# Patient Record
Sex: Female | Born: 1940 | Race: White | Hispanic: No | Marital: Married | Smoking: Never smoker
Health system: Southern US, Community
[De-identification: ages and names within clinical notes are randomized; demographics above are authoritative.]

## PROBLEM LIST (undated history)

## (undated) DIAGNOSIS — R911 Solitary pulmonary nodule: Secondary | ICD-10-CM

## (undated) DIAGNOSIS — K649 Unspecified hemorrhoids: Secondary | ICD-10-CM

## (undated) DIAGNOSIS — L719 Rosacea, unspecified: Secondary | ICD-10-CM

## (undated) DIAGNOSIS — C801 Malignant (primary) neoplasm, unspecified: Secondary | ICD-10-CM

## (undated) DIAGNOSIS — J189 Pneumonia, unspecified organism: Secondary | ICD-10-CM

## (undated) DIAGNOSIS — T8859XA Other complications of anesthesia, initial encounter: Secondary | ICD-10-CM

## (undated) DIAGNOSIS — T4145XA Adverse effect of unspecified anesthetic, initial encounter: Secondary | ICD-10-CM

## (undated) DIAGNOSIS — M1990 Unspecified osteoarthritis, unspecified site: Secondary | ICD-10-CM

## (undated) DIAGNOSIS — K219 Gastro-esophageal reflux disease without esophagitis: Secondary | ICD-10-CM

## (undated) DIAGNOSIS — R112 Nausea with vomiting, unspecified: Secondary | ICD-10-CM

## (undated) DIAGNOSIS — B2790 Infectious mononucleosis, unspecified without complication: Secondary | ICD-10-CM

## (undated) DIAGNOSIS — B019 Varicella without complication: Secondary | ICD-10-CM

## (undated) DIAGNOSIS — K635 Polyp of colon: Secondary | ICD-10-CM

## (undated) DIAGNOSIS — I10 Essential (primary) hypertension: Secondary | ICD-10-CM

## (undated) DIAGNOSIS — Z9889 Other specified postprocedural states: Secondary | ICD-10-CM

## (undated) DIAGNOSIS — G459 Transient cerebral ischemic attack, unspecified: Secondary | ICD-10-CM

## (undated) HISTORY — PX: HEMORRHOID SURGERY: SHX153

## (undated) HISTORY — DX: Essential (primary) hypertension: I10

## (undated) HISTORY — DX: Varicella without complication: B01.9

## (undated) HISTORY — DX: Solitary pulmonary nodule: R91.1

## (undated) HISTORY — DX: Polyp of colon: K63.5

---

## 1990-05-15 HISTORY — PX: CHOLECYSTECTOMY: SHX55

## 1991-05-16 HISTORY — PX: ABDOMINAL HYSTERECTOMY: SHX81

## 1997-11-27 ENCOUNTER — Ambulatory Visit (HOSPITAL_COMMUNITY): Admission: RE | Admit: 1997-11-27 | Discharge: 1997-11-27 | Payer: Self-pay | Admitting: Family Medicine

## 1997-12-03 ENCOUNTER — Other Ambulatory Visit: Admission: RE | Admit: 1997-12-03 | Discharge: 1997-12-03 | Payer: Self-pay | Admitting: Family Medicine

## 1998-06-16 ENCOUNTER — Other Ambulatory Visit: Admission: RE | Admit: 1998-06-16 | Discharge: 1998-06-16 | Payer: Self-pay | Admitting: Gynecology

## 1998-07-07 ENCOUNTER — Ambulatory Visit (HOSPITAL_COMMUNITY): Admission: RE | Admit: 1998-07-07 | Discharge: 1998-07-07 | Payer: Self-pay | Admitting: Gastroenterology

## 1998-08-18 ENCOUNTER — Inpatient Hospital Stay (HOSPITAL_COMMUNITY): Admission: RE | Admit: 1998-08-18 | Discharge: 1998-08-20 | Payer: Self-pay | Admitting: Gynecology

## 1998-11-22 ENCOUNTER — Other Ambulatory Visit: Admission: RE | Admit: 1998-11-22 | Discharge: 1998-11-22 | Payer: Self-pay | Admitting: Family Medicine

## 1999-07-27 ENCOUNTER — Encounter: Admission: RE | Admit: 1999-07-27 | Discharge: 1999-07-27 | Payer: Self-pay | Admitting: Family Medicine

## 1999-07-27 ENCOUNTER — Encounter: Payer: Self-pay | Admitting: Family Medicine

## 2000-01-24 ENCOUNTER — Other Ambulatory Visit: Admission: RE | Admit: 2000-01-24 | Discharge: 2000-01-24 | Payer: Self-pay | Admitting: Family Medicine

## 2000-08-15 ENCOUNTER — Encounter: Admission: RE | Admit: 2000-08-15 | Discharge: 2000-08-15 | Payer: Self-pay | Admitting: Family Medicine

## 2000-08-15 ENCOUNTER — Encounter: Payer: Self-pay | Admitting: Family Medicine

## 2000-12-28 ENCOUNTER — Ambulatory Visit: Admission: RE | Admit: 2000-12-28 | Discharge: 2000-12-28 | Payer: Self-pay | Admitting: Orthopedic Surgery

## 2000-12-28 ENCOUNTER — Encounter: Payer: Self-pay | Admitting: Orthopedic Surgery

## 2001-01-10 ENCOUNTER — Ambulatory Visit (HOSPITAL_COMMUNITY): Admission: RE | Admit: 2001-01-10 | Discharge: 2001-01-10 | Payer: Self-pay | Admitting: Orthopedic Surgery

## 2001-04-16 ENCOUNTER — Encounter: Payer: Self-pay | Admitting: Orthopedic Surgery

## 2001-04-17 ENCOUNTER — Ambulatory Visit (HOSPITAL_COMMUNITY): Admission: RE | Admit: 2001-04-17 | Discharge: 2001-04-18 | Payer: Self-pay | Admitting: Orthopedic Surgery

## 2001-08-19 ENCOUNTER — Encounter: Admission: RE | Admit: 2001-08-19 | Discharge: 2001-08-19 | Payer: Self-pay | Admitting: Family Medicine

## 2001-08-19 ENCOUNTER — Encounter: Payer: Self-pay | Admitting: Family Medicine

## 2001-10-31 ENCOUNTER — Other Ambulatory Visit: Admission: RE | Admit: 2001-10-31 | Discharge: 2001-10-31 | Payer: Self-pay | Admitting: Family Medicine

## 2002-05-15 HISTORY — PX: BREAST BIOPSY: SHX20

## 2002-10-01 ENCOUNTER — Encounter: Admission: RE | Admit: 2002-10-01 | Discharge: 2002-10-01 | Payer: Self-pay | Admitting: Family Medicine

## 2002-10-01 ENCOUNTER — Encounter: Payer: Self-pay | Admitting: Family Medicine

## 2002-10-06 ENCOUNTER — Encounter: Admission: RE | Admit: 2002-10-06 | Discharge: 2002-10-06 | Payer: Self-pay | Admitting: Family Medicine

## 2002-10-06 ENCOUNTER — Encounter: Payer: Self-pay | Admitting: Family Medicine

## 2002-11-04 ENCOUNTER — Other Ambulatory Visit: Admission: RE | Admit: 2002-11-04 | Discharge: 2002-11-04 | Payer: Self-pay | Admitting: Family Medicine

## 2003-04-14 ENCOUNTER — Encounter: Admission: RE | Admit: 2003-04-14 | Discharge: 2003-04-14 | Payer: Self-pay | Admitting: Family Medicine

## 2003-12-14 ENCOUNTER — Observation Stay (HOSPITAL_COMMUNITY): Admission: EM | Admit: 2003-12-14 | Discharge: 2003-12-15 | Payer: Self-pay | Admitting: Emergency Medicine

## 2003-12-14 ENCOUNTER — Encounter: Admission: RE | Admit: 2003-12-14 | Discharge: 2003-12-14 | Payer: Self-pay | Admitting: Family Medicine

## 2003-12-14 ENCOUNTER — Other Ambulatory Visit: Admission: RE | Admit: 2003-12-14 | Discharge: 2003-12-14 | Payer: Self-pay | Admitting: Family Medicine

## 2004-05-25 ENCOUNTER — Encounter: Admission: RE | Admit: 2004-05-25 | Discharge: 2004-05-25 | Payer: Self-pay | Admitting: Family Medicine

## 2004-06-29 ENCOUNTER — Encounter: Admission: RE | Admit: 2004-06-29 | Discharge: 2004-06-29 | Payer: Self-pay | Admitting: Family Medicine

## 2004-08-08 ENCOUNTER — Ambulatory Visit: Payer: Self-pay | Admitting: Internal Medicine

## 2004-08-18 ENCOUNTER — Ambulatory Visit: Payer: Self-pay | Admitting: Internal Medicine

## 2005-01-17 ENCOUNTER — Other Ambulatory Visit: Admission: RE | Admit: 2005-01-17 | Discharge: 2005-01-17 | Payer: Self-pay | Admitting: Family Medicine

## 2005-02-06 ENCOUNTER — Ambulatory Visit: Payer: Self-pay | Admitting: Internal Medicine

## 2005-03-07 ENCOUNTER — Ambulatory Visit: Payer: Self-pay | Admitting: Internal Medicine

## 2005-05-15 DIAGNOSIS — G459 Transient cerebral ischemic attack, unspecified: Secondary | ICD-10-CM

## 2005-05-15 HISTORY — DX: Transient cerebral ischemic attack, unspecified: G45.9

## 2005-05-30 ENCOUNTER — Encounter: Admission: RE | Admit: 2005-05-30 | Discharge: 2005-05-30 | Payer: Self-pay | Admitting: Family Medicine

## 2005-09-13 ENCOUNTER — Encounter: Admission: RE | Admit: 2005-09-13 | Discharge: 2005-09-13 | Payer: Self-pay | Admitting: Family Medicine

## 2005-11-07 ENCOUNTER — Ambulatory Visit: Payer: Self-pay | Admitting: Internal Medicine

## 2006-06-05 ENCOUNTER — Encounter: Admission: RE | Admit: 2006-06-05 | Discharge: 2006-06-05 | Payer: Self-pay | Admitting: Family Medicine

## 2006-06-08 ENCOUNTER — Other Ambulatory Visit: Admission: RE | Admit: 2006-06-08 | Discharge: 2006-06-08 | Payer: Self-pay | Admitting: Family Medicine

## 2006-06-24 ENCOUNTER — Inpatient Hospital Stay (HOSPITAL_COMMUNITY): Admission: EM | Admit: 2006-06-24 | Discharge: 2006-06-30 | Payer: Self-pay | Admitting: Emergency Medicine

## 2007-06-10 ENCOUNTER — Encounter: Admission: RE | Admit: 2007-06-10 | Discharge: 2007-06-10 | Payer: Self-pay | Admitting: Family Medicine

## 2007-06-13 ENCOUNTER — Encounter: Admission: RE | Admit: 2007-06-13 | Discharge: 2007-06-13 | Payer: Self-pay | Admitting: Family Medicine

## 2007-11-25 ENCOUNTER — Encounter: Admission: RE | Admit: 2007-11-25 | Discharge: 2007-11-25 | Payer: Self-pay | Admitting: Family Medicine

## 2008-06-10 ENCOUNTER — Encounter: Admission: RE | Admit: 2008-06-10 | Discharge: 2008-06-10 | Payer: Self-pay | Admitting: Family Medicine

## 2008-09-13 ENCOUNTER — Ambulatory Visit: Payer: Self-pay | Admitting: Diagnostic Radiology

## 2008-09-13 ENCOUNTER — Emergency Department (HOSPITAL_BASED_OUTPATIENT_CLINIC_OR_DEPARTMENT_OTHER): Admission: EM | Admit: 2008-09-13 | Discharge: 2008-09-13 | Payer: Self-pay | Admitting: Emergency Medicine

## 2009-06-11 ENCOUNTER — Encounter: Admission: RE | Admit: 2009-06-11 | Discharge: 2009-06-11 | Payer: Self-pay | Admitting: Family Medicine

## 2009-07-21 ENCOUNTER — Encounter (INDEPENDENT_AMBULATORY_CARE_PROVIDER_SITE_OTHER): Payer: Self-pay | Admitting: *Deleted

## 2009-08-30 ENCOUNTER — Encounter (INDEPENDENT_AMBULATORY_CARE_PROVIDER_SITE_OTHER): Payer: Self-pay | Admitting: *Deleted

## 2009-09-22 ENCOUNTER — Encounter: Admission: RE | Admit: 2009-09-22 | Discharge: 2009-09-22 | Payer: Self-pay | Admitting: Family Medicine

## 2009-09-28 ENCOUNTER — Encounter (INDEPENDENT_AMBULATORY_CARE_PROVIDER_SITE_OTHER): Payer: Self-pay | Admitting: *Deleted

## 2009-10-01 ENCOUNTER — Ambulatory Visit: Payer: Self-pay | Admitting: Internal Medicine

## 2009-10-14 ENCOUNTER — Ambulatory Visit: Payer: Self-pay | Admitting: Internal Medicine

## 2009-10-16 ENCOUNTER — Encounter: Payer: Self-pay | Admitting: Internal Medicine

## 2009-10-25 ENCOUNTER — Encounter: Admission: RE | Admit: 2009-10-25 | Discharge: 2009-10-25 | Payer: Self-pay | Admitting: Family Medicine

## 2009-11-01 ENCOUNTER — Encounter: Admission: RE | Admit: 2009-11-01 | Discharge: 2009-11-01 | Payer: Self-pay | Admitting: Family Medicine

## 2009-11-03 ENCOUNTER — Encounter: Admission: RE | Admit: 2009-11-03 | Discharge: 2009-11-03 | Payer: Self-pay | Admitting: Family Medicine

## 2009-11-09 ENCOUNTER — Encounter (INDEPENDENT_AMBULATORY_CARE_PROVIDER_SITE_OTHER): Payer: Self-pay | Admitting: *Deleted

## 2009-11-10 ENCOUNTER — Ambulatory Visit: Payer: Self-pay | Admitting: Internal Medicine

## 2009-11-24 ENCOUNTER — Ambulatory Visit: Payer: Self-pay | Admitting: Internal Medicine

## 2009-11-29 ENCOUNTER — Encounter: Payer: Self-pay | Admitting: Internal Medicine

## 2010-06-05 ENCOUNTER — Encounter: Payer: Self-pay | Admitting: Internal Medicine

## 2010-06-13 ENCOUNTER — Encounter
Admission: RE | Admit: 2010-06-13 | Discharge: 2010-06-13 | Payer: Self-pay | Source: Home / Self Care | Attending: Family Medicine | Admitting: Family Medicine

## 2010-06-14 NOTE — Letter (Signed)
Summary: Previsit letter  [facility]  [address] [place], [place] [address]   Phone: [phone]  Fax: [phone]       08/30/2009 MRN: [id number]  Kimberly Ferrell [address] [place], [place]  [address]  Dear [name],  Welcome to the Gastroenterology Division at [facility].    You are scheduled to see a nurse for your pre-procedure visit on 09-30-09 at 11:00a.m. on the 3rd floor at [facility], [address]  We ask that you try to arrive at our office 15 minutes prior to your appointment time to allow for check-in.  Your nurse visit will consist of discussing your medical and surgical history, your immediate family medical history, and your medications.    Please bring a complete list of all your medications or, if you prefer, bring the medication bottles and we will list them.  We will need to be aware of both prescribed and over the counter drugs.  We will need to know exact dosage information as well.  If you are on blood thinners (Coumadin, Plavix, Aggrenox, Ticlid, etc.) please call our office today/prior to your appointment, as we need to consult with your physician about holding your medication.   Please be prepared to read and sign documents such as consent forms, a financial agreement, and acknowledgement forms.  If necessary, and with your consent, a friend or relative is welcome to sit-in on the nurse visit with you.  Please bring your insurance card so that we may make a copy of it.  If your insurance requires a referral to see a specialist, please bring your referral form from your primary care physician.  No co-pay is required for this nurse visit.     If you cannot keep your appointment, please call [phone] to cancel or reschedule prior to your appointment date.  This allows [place] the opportunity to schedule an appointment for another patient in need of care.    Thank you for choosing [facility] for your medical needs.  We  appreciate the opportunity to care for you.  Please visit [place] at our website  to learn more about our practice.                     Sincerely.                                                                                                                   The Gastroenterology Division

## 2010-06-14 NOTE — Procedures (Signed)
Summary: Colonoscopy  Patient: Kimberly Ferrell Note: All result statuses are Final unless otherwise noted.  Tests: (1) Colonoscopy (COL)   COL Colonoscopy           DONE (C)     [facility]     [address]     [place], [place]  [address]           COLONOSCOPY PROCEDURE REPORT           PATIENT:  Kimberly Ferrell, Kimberly Ferrell  MR#:  [id number]     BIRTHDATE:  [birth year], 68 yrs. old  GENDER:  female     ENDOSCOPIST:  Wilhemina Bonito. Eda Keys, MD     REF. BY:  Surveillance Program Recall,     PROCEDURE DATE:  11/24/2009     PROCEDURE:  Colonoscopy with snare polypectomy x 3     ASA CLASS:  Class II     INDICATIONS:  history of pre-cancerous (adenomatous) colon polyps,     family history of colon cancer ;parent 81; prior exams     1996;2000;2006 w/ TA     MEDICATIONS:   Fentanyl 100 mcg IV, Versed 10 mg IV           DESCRIPTION OF PROCEDURE:   After the risks benefits and     alternatives of the procedure were thoroughly explained, informed     consent was obtained.  Digital rectal exam was performed and     revealed no abnormalities.   The LB CF-H180AL [id number] endoscope     was introduced through the anus and advanced to the cecum, which     was identified by both the appendix and ileocecal valve, without     limitations.Time = 6:37min.  The quality of the prep was good,     using MoviPrep.  The instrument was then slowly withdrawn (time =     11:29 min) as the colon was fully examined.     <<PROCEDUREIMAGES>>           FINDINGS:  Three polyps, all <10mm,  were found in the ascending,     transverse, and descending colon. Polyps were snared without     cautery. Retrieval was successful in 1/3.  This was otherwise a     normal examination of the colon.   Retroflexed views in the rectum     revealed hemorrhoids.    The scope was then withdrawn from the     patient and the procedure completed.           COMPLICATIONS:  None     ENDOSCOPIC IMPRESSION:     1) Three polyps - removed     2)  Otherwise normal examination     3) Hemorrhoids           RECOMMENDATIONS:     1) Follow up colonoscopy in 5 years           ______________________________     Wilhemina Bonito. Eda Keys, MD           CC:  Aida Puffer, MD; The Patient           n.     REVISED:  11/24/2009 06:01 PM     eSIGNED:   Wilhemina Bonito. Eda Keys at 11/24/2009 06:01 PM           Andee Lineman, [id number]  Note: An exclamation mark (!) indicates a result that was not dispersed into the flowsheet. Document Creation Date: 11/24/2009 6:02 PM _______________________________________________________________________  Marland Kitchen  1) Order result status: Final Collection or observation date-time: 11/24/2009 14:50 Requested date-time:  Receipt date-time:  Reported date-time:  Referring Physician:   Ordering Physician: Fransico Setters [phone]) Specimen Source:  Source: Launa Grill Order Number: [id number] Lab site:   Appended Document: Colonoscopy     Procedures Next Due Date:    Colonoscopy: 11/2014

## 2010-06-14 NOTE — Letter (Signed)
Summary: Colonoscopy Letter  [facility]  [address] [place], [place] [address]   Phone: [phone]  Fax: [phone]      July 21, 2009 MRN: [id number]   Kimberly Ferrell [address] [place], [place]  [address]   Dear [name],   According to your medical record, it is time for you to schedule a Colonoscopy. The American Cancer Society recommends this procedure as a method to detect early colon cancer. Patients with a family history of colon cancer, or a personal history of colon polyps or inflammatory bowel disease are at increased risk.  This letter has been generated based on the recommendations made at the time of your procedure. If you feel that in your particular situation this may no longer apply, please contact our office.  Please call our office at [phone] to schedule this appointment or to update your records at your earliest convenience.  Thank you for cooperating with [place] to provide you with the very best care possible.   Sincerely,  Wilhemina Bonito. [name], M.D.  [facility] Gastroenterology [facility] [phone]

## 2010-06-14 NOTE — Letter (Signed)
Summary: Patient Notice- Polyp Results  [facility]  [address] [place], [place] [address]   Phone: [phone]  Fax: [phone]        November 29, 2009 MRN: [id number]    Kimberly Ferrell [address] [place], [place]  [address]    Dear [name],  I am pleased to inform you that the colon polyp(s) removed during your recent colonoscopy was (were) found to be benign (no cancer detected) upon pathologic examination.  I recommend you have a repeat colonoscopy examination in 5 years to look for recurrent polyps, as having colon polyps increases your risk for having recurrent polyps or even colon cancer in the future.  Should you develop new or worsening symptoms of abdominal pain, bowel habit changes or bleeding from the rectum or bowels, please schedule an evaluation with either your primary care physician or with me.  Additional information/recommendations:  __ No further action with gastroenterology is needed at this time. Please      follow-up with your primary care physician for your other healthcare      needs.   Please call us if you are having persistent problems or have questions about your condition that have not been fully answered at this time.  Sincerely,  Hilarie Fredrickson MD  This letter has been electronically signed by your physician.  Appended Document: Patient Notice- Polyp Results letter mailed.

## 2010-06-14 NOTE — Letter (Signed)
Summary: [facility] Instructions  [facility]  [address] [place], [place] [address]   Phone: [phone]  Fax: [phone]       Kimberly Ferrell    August 09, 1940    MRN: [id number]        Procedure Day Dorna Bloom:  [facility]  11/24/09     Arrival Time:  1:00PM     Procedure Time:  2:00PM     Location of Procedure:                    _X _  [facility] (4th Floor)                        PREPARATION FOR COLONOSCOPY WITH MOVIPREP   Starting 5 days prior to your procedure 11/19/09 do not eat nuts, seeds, popcorn, corn, beans, peas,  salads, or any raw vegetables.  Do not take any fiber supplements (e.g. Metamucil, Citrucel, and Benefiber).  TWO DAYS BEFORE YOUR PROCEDURE         DATE: 11/22/09  DAY: MONDAY  1.  Drink clear liquids the entire day-NO SOLID FOOD  2.  Do not drink anything colored red or purple.  Avoid juices with pulp.  No orange juice.  3.  Drink at least 64 oz. (8 glasses) of fluid/clear liquids during the day on Tuesday to prevent dehydration and help the prep work efficiently.  CLEAR LIQUIDS INCLUDE: Water Jello Ice Popsicles Tea (sugar ok, no milk/cream) Powdered fruit flavored drinks Coffee (sugar ok, no milk/cream) Gatorade Juice: apple, white grape, white cranberry  Lemonade Clear bullion, consomm, broth Carbonated beverages (any kind) Strained chicken noodle soup Hard Candy                             4.  In the morning, Tuesday 11/23/09, drink 1 bottle of Mag Citrate, then mix first dose of MoviPrep solution:    Empty 1 Pouch A and 1 Pouch B into the disposable container    Add lukewarm drinking water to the top line of the container. Mix to dissolve    Refrigerate (mixed solution should be used within 24 hrs)  5.  At 4:30 PM take Reglan/Metoclopramide 10mg .          Begin drinking the Movi prep at 5:00 p.m. The MoviPrep container is divided by 4 marks.   Every 15 minutes drink the solution down to the next mark  (approximately 8 oz) until the full liter is complete.   6.  Follow completed prep with 16 oz of clear liquid of your choice (Nothing red or purple).  Continue to drink clear liquids until bedtime.  7.  Before going to bed, mix second dose of MoviPrep solution:    Empty 1 Pouch A and 1 Pouch B into the disposable container    Add lukewarm drinking water to the top line of the container. Mix to dissolve    Refrigerate  THE DAY OF YOUR PROCEDURE      DATE: 11/24/09 DAY: WEDNESDAY  At 8:30AM take Reglan/Metoclopramide 10mg   Beginning at 9:00AM (5 hours before procedure):         1. Every 15 minutes, drink the solution down to the next mark (approx 8 oz) until the full liter is complete.  2. Follow completed prep with 16 oz. of clear liquid of your choice.    3. You may drink clear liquids until 12:00PM (  2 HOURS BEFORE PROCEDURE).   MEDICATION INSTRUCTIONS  Unless otherwise instructed, you should take regular prescription medications with a small sip of water   as early as possible the morning of your procedure.          OTHER INSTRUCTIONS  You will need a responsible adult at least 70 years of age to accompany you and drive you home.   This person must remain in the waiting room during your procedure.  Wear loose fitting clothing that is easily removed.  Leave jewelry and other valuables at home.  However, you may wish to bring a book to read or  an iPod/MP3 player to listen to music as you wait for your procedure to start.  Remove all body piercing jewelry and leave at home.  Total time from sign-in until discharge is approximately 2-3 hours.  You should go home directly after your procedure and rest.  You can resume normal activities the  day after your procedure.  The day of your procedure you should not:   Drive   Make legal decisions   Operate machinery   Drink alcohol   Return to work  You will receive specific instructions about eating, activities  and medications before you leave.    The above instructions have been reviewed and explained to me by   Wyona Almas RN  November 10, 2009 9:28 AM     I fully understand and can verbalize these instructions _____________________________ Date _________

## 2010-06-14 NOTE — Procedures (Signed)
Summary: Colonoscopy  Patient: Kimberly Ferrell Note: All result statuses are Final unless otherwise noted.  Tests: (1) Colonoscopy (COL)   COL Colonoscopy           DONE     [facility]     [address]     [place], [place]  [address]           COLONOSCOPY PROCEDURE REPORT           PATIENT:  Kimberly Ferrell, Kimberly Ferrell  MR#:  [id number]     BIRTHDATE:  1940/11/22, 68 yrs. old  GENDER:  female     ENDOSCOPIST:  Kimberly Bonito. Eda Keys, MD     REF. BY:  Surveillance Program Recall,     PROCEDURE DATE:  10/14/2009     PROCEDURE:  Colonoscopy with snare polypectomy x 2     ASA CLASS:  Class II     INDICATIONS:  family history of colon cancer, history of     pre-cancerous (adenomatous) colon polyps ; Parent at 52;     adenomatous polyps 1996,2000, 2006     MEDICATIONS:   Fentanyl 50 mcg IV, Versed 7 mg IV           DESCRIPTION OF PROCEDURE:   After the risks benefits and     alternatives of the procedure were thoroughly explained, informed     consent was obtained.  Digital rectal exam was performed and     revealed moderate external hemorrhoids.   The LB CF-H180AL [id number]     endoscope was introduced through the anus and advanced to the     sigmoid colon, limited by poor preparation.    The quality of the     prep was poor, using MoviPrep.  The instrument was then slowly     withdrawn as the colon was fully examined.     <<PROCEDUREIMAGES>>           FINDINGS:  Two polyps (72mm,4mm) were found in the rectum. Polyps     were snared without cautery. Retrieval was successful in the     larger polyp.  The prep was not adequate (large quantity of formed     stool) to allow appropriate inspection of the mucosa and the exam     could not be carried out beyond the sigmoid colon.Marland KitchenMarland KitchenRetroflexed     views in the rectum revealed internal hemorrhoids and stool.     The scope was then withdrawn from the patient and the procedure     completed.           COMPLICATIONS:  None     ENDOSCOPIC  IMPRESSION:     1) Two polyps in the rectum -removed     2) Poor prep / incomplete exam     3) Internal  and external hemorrhoids           RECOMMENDATIONS:     1) Follow up colonoscopy in the upcoming weeks due to poor prep.     RECOMMEND TWO DAYS OF CLEARS PRIOR TO COLONOSCOPY, ONE BOTTLE OF     MAG CITRATE THE MORNING PRIOR, AND STANDARD MOVI PREP           ______________________________     Kimberly Bonito. Eda Keys, MD           CC:  Kimberly Puffer, MD; The Patient           n.     eSIGNED:   Wilhemina Bonito. Eda Ferrell at  10/14/2009 10:09 AM           Kimberly Ferrell, [id number]  Note: An exclamation mark (!) indicates a result that was not dispersed into the flowsheet. Document Creation Date: 10/14/2009 10:10 AM _______________________________________________________________________  (1) Order result status: Final Collection or observation date-time: 10/14/2009 10:00 Requested date-time:  Receipt date-time:  Reported date-time:  Referring Physician:   Ordering Physician: Kimberly Ferrell [phone]) Specimen Source:  Source: Launa Grill Order Number: [id number] Lab site:   Appended Document: Colonoscopy Colon scheduled for 11-24-09 , pre-visit 11-10-09

## 2010-06-14 NOTE — Letter (Signed)
Summary: [facility] Instructions  [name]  [address] [place], [place] [address]   Phone: [phone]  Fax: [phone]       Kimberly Ferrell    Feb 24, 1941    MRN: [id number]        Procedure Day Dorna Bloom: [name]  10/14/09     Arrival Time:  8:00am     Procedure Time:  9:00am     Location of Procedure:                    [name]  [facility] (4th Floor)                        PREPARATION FOR COLONOSCOPY WITH MOVIPREP   Starting 5 days prior to your procedure  SATURDAY 05/28  do not eat nuts, seeds, popcorn, corn, beans, peas,  salads, or any raw vegetables.  Do not take any fiber supplements (e.g. Metamucil, Citrucel, and Benefiber).  THE DAY BEFORE YOUR PROCEDURE         DATE:  [facility]  06/01  1.  Drink clear liquids the entire day-NO SOLID FOOD  2.  Do not drink anything colored red or purple.  Avoid juices with pulp.  No orange juice.  3.  Drink at least 64 oz. (8 glasses) of fluid/clear liquids during the day to prevent dehydration and help the prep work efficiently.  CLEAR LIQUIDS INCLUDE: Water Jello Ice Popsicles Tea (sugar ok, no milk/cream) Powdered fruit flavored drinks Coffee (sugar ok, no milk/cream) Gatorade Juice: apple, white grape, white cranberry  Lemonade Clear bullion, consomm, broth Carbonated beverages (any kind) Strained chicken noodle soup Hard Candy                             4.  In the morning, mix first dose of MoviPrep solution:    Empty 1 Pouch A and 1 Pouch B into the disposable container    Add lukewarm drinking water to the top line of the container. Mix to dissolve    Refrigerate (mixed solution should be used within 24 hrs)  5.  Begin drinking the prep at 5:00 p.m. The MoviPrep container is divided by 4 marks.   Every 15 minutes drink the solution down to the next mark (approximately 8 oz) until the full liter is complete.   6.  Follow completed prep with 16 oz of clear liquid of your choice  (Nothing red or purple).  Continue to drink clear liquids until bedtime.  7.  Before going to bed, mix second dose of MoviPrep solution:    Empty 1 Pouch A and 1 Pouch B into the disposable container    Add lukewarm drinking water to the top line of the container. Mix to dissolve    Refrigerate  THE DAY OF YOUR PROCEDURE      DATE:  THURSDAY 06/02  Beginning at  4:00 a.m. (5 hours before procedure):         1. Every 15 minutes, drink the solution down to the next mark (approx 8 oz) until the full liter is complete.  2. Follow completed prep with 16 oz. of clear liquid of your choice.    3. You may drink clear liquids until 7:00am  (2 HOURS BEFORE PROCEDURE).   MEDICATION INSTRUCTIONS  Unless otherwise instructed, you should take regular prescription medications with a small sip of water   as early  as possible the morning of your procedure.   Additional medication instructions: n/a         OTHER INSTRUCTIONS  You will need a responsible adult at least 70 years of age to accompany you and drive you home.   This person must remain in the waiting room during your procedure.  Wear loose fitting clothing that is easily removed.  Leave jewelry and other valuables at home.  However, you may wish to bring a book to read or  an iPod/MP3 player to listen to music as you wait for your procedure to start.  Remove all body piercing jewelry and leave at home.  Total time from sign-in until discharge is approximately 2-3 hours.  You should go home directly after your procedure and rest.  You can resume normal activities the  day after your procedure.  The day of your procedure you should not:   Drive   Make legal decisions   Operate machinery   Drink alcohol   Return to work  You will receive specific instructions about eating, activities and medications before you leave.    The above instructions have been reviewed and explained to me by   Sherren Kerns RN  Oct 01, 2009 9:03 AM     I fully understand and can verbalize these instructions _____________________________ Date _________

## 2010-06-14 NOTE — Miscellaneous (Signed)
Summary: previsit/rm  Clinical Lists Changes  Medications: Added new medication of MOVIPREP 100 GM  SOLR (PEG-KCL-NACL-NASULF-NA ASC-C) As per prep instructions. - Signed Rx of MOVIPREP 100 GM  SOLR (PEG-KCL-NACL-NASULF-NA ASC-C) As per prep instructions.;  #1 x 0;  Signed;  Entered by: Sherren Kerns RN;  Authorized by: Hilarie Fredrickson MD;  Method used: Electronically to [facility]*, [address] [place], [place]  [address], Ph: [phone] or [phone], Fax: [phone] Allergies: Added new allergy or adverse reaction of SULFA Observations: Added new observation of NKA: F (10/01/2009 8:11)    Prescriptions: MOVIPREP 100 GM  SOLR (PEG-KCL-NACL-NASULF-NA ASC-C) As per prep instructions.  #1 x 0   Entered by:   Sherren Kerns RN   Authorized by:   Hilarie Fredrickson MD   Signed by:   Sherren Kerns RN on 10/01/2009   Method used:   Electronically to        [facility]* (retail)       [address] [place], [place]  [address]       Ph: [phone] or [phone]       Fax: [phone]   RxID:   [id number]

## 2010-06-14 NOTE — Miscellaneous (Signed)
Summary: [facility] Previsit/prep  Clinical Lists Changes  Medications: Added new medication of MOVIPREP 100 GM  SOLR (PEG-KCL-NACL-NASULF-NA ASC-C) As per prep instructions. - Signed Added new medication of METOCLOPRAMIDE HCL 10 MG  TABS (METOCLOPRAMIDE HCL) As per prep instructions. - Signed Rx of MOVIPREP 100 GM  SOLR (PEG-KCL-NACL-NASULF-NA ASC-C) As per prep instructions.;  #1 x 0;  Signed;  Entered by: Wyona Almas RN;  Authorized by: Hilarie Fredrickson MD;  Method used: Electronically to [facility]*, [address] [place], [place]  [address], Ph: [phone] or [phone], Fax: [phone] Rx of METOCLOPRAMIDE HCL 10 MG  TABS (METOCLOPRAMIDE HCL) As per prep instructions.;  #2 x 0;  Signed;  Entered by: Wyona Almas RN;  Authorized by: Hilarie Fredrickson MD;  Method used: Electronically to [facility]*, [address], [place], [place]  [address], Ph: [phone] or [phone], Fax: [phone] Allergies: Changed allergy or adverse reaction from SULFA to * IVP DYE Observations: Added new observation of ALLERGY REV: Done (11/10/2009 8:25)    Prescriptions: METOCLOPRAMIDE HCL 10 MG  TABS (METOCLOPRAMIDE HCL) As per prep instructions.  #2 x 0   Entered by:   Wyona Almas RN   Authorized by:   Hilarie Fredrickson MD   Signed by:   Wyona Almas RN on 11/10/2009   Method used:   Electronically to        [facility]* (retail)       [address] [place], [place]  [address]       Ph: [phone] or [phone]       Fax: [phone]   RxID:   [id number] MOVIPREP 100 GM  SOLR (PEG-KCL-NACL-NASULF-NA ASC-C) As per prep instructions.  #1 x 0   Entered by:   Wyona Almas RN   Authorized by:   Hilarie Fredrickson MD   Signed by:   Wyona Almas RN on 11/10/2009   Method used:   Electronically to        [facility]*  (retail)       [address] [place], [place]  [address]       Ph: [phone] or [phone]       Fax: [phone]   RxID:   [id number]

## 2010-06-14 NOTE — Letter (Signed)
Summary: Patient Notice- Polyp Results  [facility]  [address] [place], [place] [address]   Phone: [phone]  Fax: [phone]        October 16, 2009 MRN: [id number]    Kimberly Ferrell [address] [place], [place]  [address]    Dear [name],  I am pleased to inform you that the colon polyps removed during your recent colonoscopy were found to be benign (no cancer detected) upon pathologic examination.  Keep plans for upcoming repeat colonoscopy with more extensive colonic preparation.  Additional information/recommendations:   __ Please keep your follow-up visits as already scheduled.     Please call us if you are having persistent problems or have questions about your condition that have not been fully answered at this time.  Sincerely,  Hilarie Fredrickson MD  This letter has been electronically signed by your physician.  Appended Document: Patient Notice- Polyp Results letter mailed.

## 2011-05-31 ENCOUNTER — Other Ambulatory Visit: Payer: Self-pay | Admitting: Family Medicine

## 2011-05-31 DIAGNOSIS — Z1231 Encounter for screening mammogram for malignant neoplasm of breast: Secondary | ICD-10-CM

## 2011-06-15 ENCOUNTER — Ambulatory Visit
Admission: RE | Admit: 2011-06-15 | Discharge: 2011-06-15 | Disposition: A | Discharge status: Home / Self Care | Payer: Medicare Other | Source: Ambulatory Visit | Attending: Family Medicine | Admitting: Family Medicine

## 2011-06-15 ENCOUNTER — Ambulatory Visit (INDEPENDENT_AMBULATORY_CARE_PROVIDER_SITE_OTHER): Payer: Medicare Other | Admitting: Family Medicine

## 2011-06-15 ENCOUNTER — Encounter: Payer: Self-pay | Admitting: Family Medicine

## 2011-06-15 VITALS — BP 140/80 | HR 77 | Temp 97.5°F | Ht 66.0 in | Wt 244.4 lb

## 2011-06-15 DIAGNOSIS — I10 Essential (primary) hypertension: Secondary | ICD-10-CM

## 2011-06-15 DIAGNOSIS — Z1231 Encounter for screening mammogram for malignant neoplasm of breast: Secondary | ICD-10-CM

## 2011-06-15 DIAGNOSIS — M549 Dorsalgia, unspecified: Secondary | ICD-10-CM | POA: Insufficient documentation

## 2011-06-15 DIAGNOSIS — R350 Frequency of micturition: Secondary | ICD-10-CM

## 2011-06-15 DIAGNOSIS — IMO0001 Reserved for inherently not codable concepts without codable children: Secondary | ICD-10-CM

## 2011-06-15 LAB — POCT URINALYSIS DIPSTICK
Bilirubin, UA: NEGATIVE
Glucose, UA: NEGATIVE
Protein, UA: NEGATIVE
Urobilinogen, UA: 0.2
pH, UA: 7.5

## 2011-06-15 MED ORDER — CEPHALEXIN 500 MG PO CAPS: 500.0000 mg | ORAL_CAPSULE | Freq: Two times a day (BID) | ORAL | Status: DC

## 2011-06-15 MED ORDER — CYCLOBENZAPRINE HCL 5 MG PO TABS: 5.0000 mg | ORAL_TABLET | Freq: Three times a day (TID) | ORAL | Status: DC | PRN

## 2011-06-15 MED ORDER — NAPROXEN 500 MG PO TABS: 500.0000 mg | ORAL_TABLET | Freq: Two times a day (BID) | ORAL | Status: DC

## 2011-06-15 NOTE — Assessment & Plan Note (Signed)
Pt's UA consistent w/ infxn.  Start abx.  Reviewed supportive care and red flags that should prompt return.  Pt expressed understanding and is in agreement w/ plan.  

## 2011-06-15 NOTE — Assessment & Plan Note (Signed)
New.  Pt's pain is consistent w/ muscular spasm/strain.  Likely due to the heavy lifting she did.  Start scheduled NSAIDs, muscle relaxer at night.  Heating pad prn.  Reviewed supportive care and red flags that should prompt return.  Pt expressed understanding and is in agreement w/ plan.

## 2011-06-15 NOTE — Progress Notes (Signed)
  Subjective:    Patient ID: Kimberly Ferrell, female    DOB: Nov 05, 1940, 71 y.o.   MRN: [id number]  HPI New to establish.  Previous MD- Dr Aida Puffer  HTN- chronic problem, on Lotensin HCT.  No CP, SOB, visual changes, edema.  + HAs.  Last had lab work done spring 2012.  Neck/back pain- 3 weeks ago woke up w/ 'crick in my neck'.  L sided pain.  Pain turning head to L.  No pain w/ turning to R or up, but pain looking down.  Pain travels down into back and hip.  Unable to lie on L side w/out pain.  No N/V, dysuria.  Increased urinary frequency.  No blood in the urine.  No incontinence.  Heating pad improves pain.  Also improves w/ tylenol and ibuprofen.  Has not slept in new or different bed.  Did lift 40 lb bag of salt pellets.   Review of Systems For ROS see HPI     Objective:   Physical Exam  Constitutional: She appears well-developed and well-nourished. No distress.  Neck: Normal range of motion. Neck supple.       + L trap spasm, + TTP  Cardiovascular: Normal rate, regular rhythm, normal heart sounds and intact distal pulses.   Pulmonary/Chest: Effort normal and breath sounds normal. No respiratory distress. She has no wheezes. She has no rales.  Abdominal: Soft. Bowel sounds are normal. She exhibits no distension. There is tenderness (mild suprapubic tenderness, no CVA tenderness).  Musculoskeletal:       Good back flexion, + pain w/ extension (-) SLR + lumbar paraspinal spasm w/ TTP  Lymphadenopathy:    She has no cervical adenopathy.  Neurological: She has normal reflexes. Coordination normal.          Assessment & Plan:

## 2011-06-15 NOTE — Assessment & Plan Note (Signed)
Chronic problem but new to provider.  BP adequately controlled today but not idea.  No changes at this time but will follow closely.

## 2011-06-15 NOTE — Patient Instructions (Signed)
So you have a couple things going on... Back strain- start the naproxen twice daily for 5-7 days to improve inflammation (take w/ food) Take the flexeril (muscle relaxer) at night UTI- you have a bladder infection.  Start the keflex as directed Drink plenty of fluids Continue the heating pad Call with any questions or concerns Welcome!  We're glad to have you!

## 2011-06-17 LAB — URINE CULTURE

## 2011-06-22 ENCOUNTER — Ambulatory Visit (INDEPENDENT_AMBULATORY_CARE_PROVIDER_SITE_OTHER): Payer: Medicare Other | Admitting: Family Medicine

## 2011-06-22 ENCOUNTER — Encounter: Payer: Self-pay | Admitting: Family Medicine

## 2011-06-22 DIAGNOSIS — M549 Dorsalgia, unspecified: Secondary | ICD-10-CM

## 2011-06-22 DIAGNOSIS — L719 Rosacea, unspecified: Secondary | ICD-10-CM

## 2011-06-22 MED ORDER — AZITHROMYCIN 250 MG PO TABS: ORAL_TABLET | ORAL | Status: DC

## 2011-06-22 NOTE — Progress Notes (Signed)
  Subjective:    Patient ID: Kimberly Ferrell, female    DOB: 1940-08-29, 71 y.o.   MRN: [id number]  HPI Sciatica- R leg, sxs started yesterday while sitting watching TV.  Has not taken Naproxen or flexeril.  'i can hardly walk'.  Difficulty getting up from seated position.  Pain will radiate from R buttock down to level of knee.  Pain described as a shooting numbness.  No bowel or bladder incontinence.  Rosacea- was previously taking Azithro 3x/week to control sxs.   Review of Systems For ROS see HPI     Objective:   Physical Exam  Vitals reviewed. Constitutional: She appears well-developed and well-nourished. No distress.  Musculoskeletal:       + SLR on R + TTP over sciatic notch and R glute Pain w/ forward flexion and extension Asymptomatic on L  Neurological: She has normal reflexes. Coordination normal.  Skin: Skin is warm and dry.         Assessment & Plan:

## 2011-06-22 NOTE — Patient Instructions (Signed)
This is a condition called sciatica Restart the Naproxen twice daily for 7 days and then as needed Restart the flexeril for the muscle spasm HEAT!! If no improvement in the next week- call me! Call with any questions or concerns Hang in there!!!

## 2011-06-25 ENCOUNTER — Encounter: Payer: Self-pay | Admitting: Family Medicine

## 2011-06-25 DIAGNOSIS — L719 Rosacea, unspecified: Secondary | ICD-10-CM | POA: Insufficient documentation

## 2011-06-25 NOTE — Assessment & Plan Note (Signed)
New.  Pt on Azithro M/W/F.  Refill provided.

## 2011-06-25 NOTE — Assessment & Plan Note (Signed)
Pt's pain is consistent w/ sciatica.  Pt to restart NSAIDs and flexeril.  Reviewed supportive care and red flags that should prompt return.  If no improvement, pt to call and we will refer to ortho.

## 2011-06-28 ENCOUNTER — Ambulatory Visit: Payer: Self-pay | Admitting: Family Medicine

## 2011-07-13 ENCOUNTER — Encounter: Payer: Self-pay | Admitting: *Deleted

## 2011-10-02 ENCOUNTER — Ambulatory Visit (INDEPENDENT_AMBULATORY_CARE_PROVIDER_SITE_OTHER): Payer: Medicare Other | Admitting: Internal Medicine

## 2011-10-02 ENCOUNTER — Encounter: Payer: Self-pay | Admitting: Internal Medicine

## 2011-10-02 VITALS — BP 148/92 | HR 71 | Temp 98.2°F | Wt 245.0 lb

## 2011-10-02 DIAGNOSIS — J069 Acute upper respiratory infection, unspecified: Secondary | ICD-10-CM

## 2011-10-02 MED ORDER — AMOXICILLIN 500 MG PO CAPS: 1000.0000 mg | ORAL_CAPSULE | Freq: Two times a day (BID) | ORAL | Status: AC

## 2011-10-02 NOTE — Progress Notes (Signed)
  Subjective:    Patient ID: Kimberly Ferrell, female    DOB: 04-Jul-1940, 71 y.o.   MRN: [id number]  HPI Acute visit Symptoms started 3 days ago with cough, sinus and chest congestion. She stay in bed for 24 hours with the onset of symptoms. She is using over-the-counter Zicam. Additionally has noted a rash in her arms for 2 days, no itching or pain.  Past Medical History  Diagnosis Date  . Hypertension   . Chicken pox   . Colon polyps      Review of Systems No chest pain or shortness of breath Had fever with the onset of the symptoms but no further elevated temperature or chills. Denies any sinus pain per se, no sore throat. She has been sneezing.     Objective:   Physical Exam General -- alert, well-developed, and overweight appearing. No apparent distress.  HEENT -- TMs normal, throat w/o redness, face symmetric and not tender to palpation, nose slt  congested   Lungs -- normal respiratory effort, no intercostal retractions, no accessory muscle use, and few rhonchi with cough otherwise normal breath sounds Heart-- normal rate, regular rhythm, no murmur, and no gallop.   Skin: 2-3 lesions at the right arm and one at the left arm: macular, slightly red, not scaly. No blisters. No petechiae. Psych-- Cognition and judgment appear intact. Alert and cooperative with normal attention span and concentration.  not anxious appearing and not depressed appearing.       Assessment & Plan:   URI, will treat with Mucinex and amoxicillin. She does have 3 skin lesions, doubt they represent a serious infection or related to respiratory symptoms. Recommend observation. See instructions.

## 2011-10-02 NOTE — Patient Instructions (Signed)
Rest, fluids , tylenol For cough, take Mucinex DM twice a day as needed  Take the antibiotic as prescribed  (Amoxicillin) Call if no better in few days Call anytime if the symptoms are severe, high fever  or the rash spreads

## 2011-11-17 ENCOUNTER — Emergency Department (HOSPITAL_BASED_OUTPATIENT_CLINIC_OR_DEPARTMENT_OTHER)
Admission: EM | Admit: 2011-11-17 | Discharge: 2011-11-17 | Disposition: A | Discharge status: Home / Self Care | Payer: Medicare Other | Attending: Emergency Medicine | Admitting: Emergency Medicine

## 2011-11-17 ENCOUNTER — Emergency Department (HOSPITAL_BASED_OUTPATIENT_CLINIC_OR_DEPARTMENT_OTHER): Payer: Medicare Other

## 2011-11-17 ENCOUNTER — Encounter (HOSPITAL_BASED_OUTPATIENT_CLINIC_OR_DEPARTMENT_OTHER): Payer: Self-pay

## 2011-11-17 DIAGNOSIS — J40 Bronchitis, not specified as acute or chronic: Secondary | ICD-10-CM | POA: Insufficient documentation

## 2011-11-17 DIAGNOSIS — I10 Essential (primary) hypertension: Secondary | ICD-10-CM | POA: Insufficient documentation

## 2011-11-17 HISTORY — DX: Pneumonia, unspecified organism: J18.9

## 2011-11-17 HISTORY — DX: Infectious mononucleosis, unspecified without complication: B27.90

## 2011-11-17 MED ORDER — MOXIFLOXACIN HCL 400 MG PO TABS: 400.0000 mg | ORAL_TABLET | Freq: Every day | ORAL | Status: AC

## 2011-11-17 MED ORDER — DEXAMETHASONE SODIUM PHOSPHATE 10 MG/ML IJ SOLN
8.0000 mg | Freq: Once | INTRAMUSCULAR | Status: AC
Start: 2011-11-17 — End: 2011-11-17
  Administered 2011-11-17: 8 mg via INTRAMUSCULAR
  Filled 2011-11-17: qty 1

## 2011-11-17 MED ORDER — HYDROCOD POLST-CHLORPHEN POLST 10-8 MG/5ML PO LQCR: 5.0000 mL | Freq: Every evening | ORAL | Status: DC | PRN

## 2011-11-17 NOTE — ED Provider Notes (Signed)
History     CSN: [id number]  Arrival date & time 11/17/11  1046   First MD Initiated Contact with Patient 11/17/11 1102      Chief Complaint  Patient presents with  . Otalgia  . Cough  . Sore Throat    (Consider location/radiation/quality/duration/timing/severity/associated sxs/prior treatment) HPI Pt reports recent treatment respiratory infection and now has a 4 day history of ear pain, cough and sore throat.  Past Medical History  Diagnosis Date  . Hypertension   . Chicken pox   . Colon polyps   . Pneumonia   . Mononucleosis     Past Surgical History  Procedure Date  . Cholecystectomy 1992  . Abdominal hysterectomy 1993    Family History  Problem Relation Age of Onset  . Cancer Mother     History  Substance Use Topics  . Smoking status: Never Smoker   . Smokeless tobacco: Not on file  . Alcohol Use: No    OB History    Grav Para Term Preterm Abortions TAB SAB Ect Mult Living                  Review of Systems  All other systems reviewed and are negative.    Allergies  Review of patient's allergies indicates no known allergies.  Home Medications   Current Outpatient Rx  Name Route Sig Dispense Refill  . AZITHROMYCIN 250 MG PO TABS  Take 1 tab Monday, Wednesday, Friday for rosacea 30 each 3  . BENAZEPRIL-HYDROCHLOROTHIAZIDE 10-12.5 MG PO TABS Oral Take 1 tablet by mouth daily.    Marland Kitchen HYDROCOD POLST-CPM POLST ER 10-8 MG/5ML PO LQCR Oral Take 5 mLs by mouth at bedtime as needed. 115 mL 0  . MOXIFLOXACIN HCL 400 MG PO TABS Oral Take 1 tablet (400 mg total) by mouth daily. 7 tablet 0  . NAPROXEN 500 MG PO TABS Oral Take 1 tablet (500 mg total) by mouth 2 (two) times daily with a meal. 60 tablet 2    BP 151/77  Pulse 67  Temp 97.8 F (36.6 C) (Oral)  Resp 14  Ht 5\' 6"  (1.676 m)  Wt 235 lb (106.595 kg)  BMI 37.93 kg/m2  SpO2 97%  Physical Exam  Nursing note and vitals reviewed. Constitutional: She is oriented to person, place, and time. She  appears well-developed and well-nourished. No distress.  HENT:  Head: Normocephalic and atraumatic.  Mouth/Throat: Uvula is midline, oropharynx is clear and moist and mucous membranes are normal. No oropharyngeal exudate, posterior oropharyngeal edema or posterior oropharyngeal erythema.  Eyes: Pupils are equal, round, and reactive to light.  Neck: Normal range of motion.  Cardiovascular: Normal rate and intact distal pulses.   Pulmonary/Chest: No respiratory distress. She has wheezes.  Abdominal: Normal appearance. She exhibits no distension.  Musculoskeletal: Normal range of motion.  Neurological: She is alert and oriented to person, place, and time. No cranial nerve deficit.  Skin: Skin is warm and dry. No rash noted.  Psychiatric: She has a normal mood and affect. Her behavior is normal.    ED Course  Procedures (including critical care time) Scheduled Meds:   . dexamethasone  8 mg Intramuscular Once   Labs Reviewed - No data to display Dg Chest 2 View  11/17/2011  *RADIOLOGY REPORT*  Clinical Data: .  Sore throat.  Recent pneumonia.  Cough.  CHEST - 2 VIEW  Comparison: 09/22/2009  Findings: Heart size is normal.  Mediastinal shadows are normal. There may be mild central bronchial  thickening but there is no infiltrate, collapse or effusion.  No bony abnormalities.  IMPRESSION: Possible bronchitis.  No consolidation or collapse.  Original Report Authenticated By: Thomasenia Sales, M.D.     1. Bronchitis       MDM          Nelia Shi, MD 11/17/11 [phone]

## 2011-11-17 NOTE — ED Notes (Signed)
Pt reports recent treatment for pneumonia (10/30/11) and now has a 4 day history of ear pain, cough and sore throat.

## 2011-11-20 ENCOUNTER — Ambulatory Visit: Payer: Medicare Other | Admitting: Family Medicine

## 2011-12-28 ENCOUNTER — Other Ambulatory Visit: Payer: Self-pay | Admitting: Family Medicine

## 2011-12-28 DIAGNOSIS — R0989 Other specified symptoms and signs involving the circulatory and respiratory systems: Secondary | ICD-10-CM

## 2011-12-28 DIAGNOSIS — R55 Syncope and collapse: Secondary | ICD-10-CM

## 2012-01-03 ENCOUNTER — Other Ambulatory Visit: Payer: Medicare Other

## 2012-01-03 ENCOUNTER — Ambulatory Visit
Admission: RE | Admit: 2012-01-03 | Discharge: 2012-01-03 | Disposition: A | Discharge status: Home / Self Care | Payer: Medicare Other | Source: Ambulatory Visit | Attending: Family Medicine | Admitting: Family Medicine

## 2012-01-03 DIAGNOSIS — R0989 Other specified symptoms and signs involving the circulatory and respiratory systems: Secondary | ICD-10-CM

## 2012-01-22 ENCOUNTER — Ambulatory Visit (INDEPENDENT_AMBULATORY_CARE_PROVIDER_SITE_OTHER): Payer: Medicare Other | Admitting: Family Medicine

## 2012-01-22 ENCOUNTER — Encounter: Payer: Self-pay | Admitting: Family Medicine

## 2012-01-22 VITALS — BP 135/82 | HR 85 | Temp 98.6°F | Ht 65.0 in | Wt 246.2 lb

## 2012-01-22 DIAGNOSIS — IMO0002 Reserved for concepts with insufficient information to code with codable children: Secondary | ICD-10-CM

## 2012-01-22 DIAGNOSIS — R911 Solitary pulmonary nodule: Secondary | ICD-10-CM

## 2012-01-22 DIAGNOSIS — M5410 Radiculopathy, site unspecified: Secondary | ICD-10-CM | POA: Insufficient documentation

## 2012-01-22 DIAGNOSIS — R109 Unspecified abdominal pain: Secondary | ICD-10-CM | POA: Insufficient documentation

## 2012-01-22 LAB — POCT URINALYSIS DIPSTICK
Bilirubin, UA: NEGATIVE
Glucose, UA: NEGATIVE
Leukocytes, UA: NEGATIVE
Nitrite, UA: NEGATIVE
pH, UA: 5

## 2012-01-22 MED ORDER — HYDROCODONE-ACETAMINOPHEN 5-500 MG PO TABS: 1.0000 | ORAL_TABLET | Freq: Three times a day (TID) | ORAL | Status: DC | PRN

## 2012-01-22 MED ORDER — CIPROFLOXACIN HCL 500 MG PO TABS: 500.0000 mg | ORAL_TABLET | Freq: Two times a day (BID) | ORAL | Status: AC

## 2012-01-22 MED ORDER — CYCLOBENZAPRINE HCL 5 MG PO TABS: 5.0000 mg | ORAL_TABLET | Freq: Three times a day (TID) | ORAL | Status: AC | PRN

## 2012-01-22 NOTE — Progress Notes (Signed)
  Subjective:    Patient ID: Kimberly Ferrell, female    DOB: 10-09-1940, 71 y.o.   MRN: [id number]  HPI Back pain- started in L lumbar spine, radiated into buttock and hip.  Now having discomfort along backs of both legs.  sxs started 1 week ago.  Has been using heating pad and lying in bed x2 days.  Denies heavy lifting, any change in activity level.  No bowel or bladder incontinence, no fevers.  Some mild numbness and weakness of L leg.  Lung nodule- seen at apex of R lung on eval for PNA.  No hx of smoking.  Is at low risk for lung cancer.  Radiology recommends f/u in 1 year or not at all based on risk factors.  Pt concerned about this due to family hx.   Review of Systems For ROS see HPI     Objective:   Physical Exam  Vitals reviewed. Constitutional: She is oriented to person, place, and time. She appears well-developed and well-nourished. No distress.  Pulmonary/Chest: Effort normal and breath sounds normal. No respiratory distress. She has no wheezes. She has no rales.  Abdominal: Soft. Bowel sounds are normal. She exhibits no distension. There is no tenderness. There is no rebound and no guarding.  Musculoskeletal:       + CVA tenderness (-) SLR bilaterally Good flexion and extension w/out pain  Neurological: She is alert and oriented to person, place, and time. She has normal reflexes. No cranial nerve deficit. Coordination normal.  Skin: Skin is warm and dry.          Assessment & Plan:

## 2012-01-22 NOTE — Patient Instructions (Addendum)
Schedule your complete physical at your convenience Start the Cipro for possible UTI- take w/ food Use the muscle relaxer for back pain- will make you sleepy Take the Vicodin as needed for severe pain Continue the heating pad Call with any questions or concerns- particularly if worsening Hang in there!!!

## 2012-01-23 DIAGNOSIS — R911 Solitary pulmonary nodule: Secondary | ICD-10-CM | POA: Insufficient documentation

## 2012-01-23 NOTE — Assessment & Plan Note (Signed)
New.  May be due to possible kidney stone.  Start Vicodin prn.  Reviewed supportive care and red flags that should prompt return.  Pt expressed understanding and is in agreement w/ plan.

## 2012-01-23 NOTE — Assessment & Plan Note (Signed)
New.  Pt is not a smoker and never has been.  At low risk for lung cancer and based on radiology's recommendations does not require f/u CT.  This makes pt very nervous due to extensive family hx.  Will plan for repeat CT in 1 year.  Pt expressed understanding and is in agreement w/ plan.

## 2012-01-23 NOTE — Assessment & Plan Note (Signed)
New.  UA shows hematuria.  Start cipro in case of pyelo.  Must consider possible kidney stone as etiology of pain.  vicodin PRN.  Will hold off on imaging study at this time but reviewed red flags that should prompt immediate return.  Pt expressed understanding and is in agreement w/ plan.

## 2012-01-26 LAB — CULTURE, URINE COMPREHENSIVE: Colony Count: 75000

## 2012-01-29 ENCOUNTER — Telehealth: Payer: Self-pay | Admitting: Family Medicine

## 2012-01-29 NOTE — Telephone Encounter (Signed)
Message left on triage line 823am Pt stated she was called on Friday and advised urine culture was still growing, she is still having problems in side, back and legs, please call at [phone]

## 2012-01-29 NOTE — Telephone Encounter (Signed)
Per MD Beverely Low to have pt come in per needs evaluation, pt accepted apt for tomorrow 01-30-12 at 11:30am, MD Tabori aware verbally

## 2012-01-30 ENCOUNTER — Encounter: Payer: Self-pay | Admitting: Family Medicine

## 2012-01-30 ENCOUNTER — Ambulatory Visit (INDEPENDENT_AMBULATORY_CARE_PROVIDER_SITE_OTHER): Payer: Medicare Other | Admitting: Family Medicine

## 2012-01-30 VITALS — BP 132/80 | HR 82 | Temp 97.7°F | Ht 65.25 in | Wt 247.6 lb

## 2012-01-30 DIAGNOSIS — R109 Unspecified abdominal pain: Secondary | ICD-10-CM

## 2012-01-30 DIAGNOSIS — IMO0001 Reserved for inherently not codable concepts without codable children: Secondary | ICD-10-CM | POA: Insufficient documentation

## 2012-01-30 LAB — CBC WITH DIFFERENTIAL/PLATELET
Basophils Relative: 1.1 % (ref 0.0–3.0)
Eosinophils Relative: 1.8 % (ref 0.0–5.0)
Hemoglobin: 14 g/dL (ref 12.0–15.0)
Lymphocytes Relative: 47.7 % — ABNORMAL HIGH (ref 12.0–46.0)
MCV: 96.1 fl (ref 78.0–100.0)
Neutro Abs: 2.6 10*3/uL (ref 1.4–7.7)
Neutrophils Relative %: 40.1 % — ABNORMAL LOW (ref 43.0–77.0)
RBC: 4.45 Mil/uL (ref 3.87–5.11)
WBC: 6.5 10*3/uL (ref 4.5–10.5)

## 2012-01-30 LAB — BASIC METABOLIC PANEL
BUN: 7 mg/dL (ref 6–23)
CO2: 28 mEq/L (ref 19–32)
Chloride: 108 mEq/L (ref 96–112)
Glucose, Bld: 99 mg/dL (ref 70–99)
Potassium: 4.2 mEq/L (ref 3.5–5.1)
Sodium: 141 mEq/L (ref 135–145)

## 2012-01-30 LAB — POCT URINALYSIS DIPSTICK
Bilirubin, UA: NEGATIVE
Nitrite, UA: NEGATIVE
Protein, UA: NEGATIVE
pH, UA: 7

## 2012-01-30 MED ORDER — NAPROXEN 500 MG PO TABS: 500.0000 mg | ORAL_TABLET | Freq: Two times a day (BID) | ORAL | Status: AC

## 2012-01-30 NOTE — Assessment & Plan Note (Signed)
Persistent.  UA w/ trace blood- will send for cx.  Pain not consistent w/ kidney stone- improves w/ heat, NSAIDs, no radiation of pain.  Will await cx results and tx prn.  Pt expressed understanding and is in agreement w/ plan.

## 2012-01-30 NOTE — Progress Notes (Signed)
  Subjective:    Patient ID: Kimberly Ferrell, female    DOB: 12-25-1940, 71 y.o.   MRN: [id number]  HPI Back pain- still having L flank pain but pain is now in neck and behind knees bilaterally.  Pain described as muscle soreness, 'like after you work out'.  No fevers, no chills.  No dysuria, frequency, urgency.  Pain doesn't worsen or change w/ movement.  Pain improves w/ sitting in recliner w/ legs up.  Heating pad provides relief.  No relief w/ flexeril.  No bowel or bladder incontinence.  Pain improves w/ NSAIDs.   Review of Systems For ROS see HPI     Objective:   Physical Exam  Vitals reviewed. Constitutional: She is oriented to person, place, and time. She appears well-developed and well-nourished. No distress.  Musculoskeletal: She exhibits no edema.       (-) SLR bilaterally No pain w/ flexion/extension of spine + TTP over L lateral calf w/out erythema, edema + TTP over L paraspinal muscles  Neurological: She is alert and oriented to person, place, and time. She has normal reflexes. No cranial nerve deficit. She exhibits normal muscle tone. Coordination normal.  Skin: Skin is warm and dry. No erythema.  Psychiatric: She has a normal mood and affect. Her behavior is normal.          Assessment & Plan:

## 2012-01-30 NOTE — Assessment & Plan Note (Signed)
New.  Pt's pain appears to be muscular but no obvious cause.  Check labs to r/o rheumatologic process.  Pt not on statin that would account for muscle pain.  Start scheduled NSAIDs, muscle relaxer prn.  Refer to sports med for complete evaluation and tx.  Reviewed supportive care and red flags that should prompt return.  Pt expressed understanding and is in agreement w/ plan.

## 2012-01-30 NOTE — Patient Instructions (Addendum)
We'll notify you of your lab results and make any changes if needed Start the Naproxen twice daily w/ food You can add tylenol in between for breakthrough pain Continue heat or ice- whichever feels better You are safe to travel Call with any questions or concerns Hang in there!

## 2012-01-31 LAB — ANA: Anti Nuclear Antibody(ANA): NEGATIVE

## 2012-02-01 ENCOUNTER — Encounter: Payer: Self-pay | Admitting: *Deleted

## 2012-02-01 ENCOUNTER — Telehealth: Payer: Self-pay | Admitting: Family Medicine

## 2012-02-01 NOTE — Telephone Encounter (Signed)
Pt called stating an ortho office called her and she is not sure why she is being referred to ortho. She would like to know more details and what the visit will involve. Pt can be reached at [phone]

## 2012-02-01 NOTE — Telephone Encounter (Signed)
Pt wants to know why she has been referred to Ortho and what Dr. Beverely Low is expecting them to look for.  I told her that she had been referred for her joint pain and myalgias for which she would be evaluated by ortho to continue trying to pinpoint the cause of her pain.  Patient understood and will make the appointment as advised.

## 2012-02-02 ENCOUNTER — Ambulatory Visit (INDEPENDENT_AMBULATORY_CARE_PROVIDER_SITE_OTHER): Payer: Medicare Other | Admitting: Family Medicine

## 2012-02-02 ENCOUNTER — Encounter: Payer: Self-pay | Admitting: Family Medicine

## 2012-02-02 ENCOUNTER — Ambulatory Visit (HOSPITAL_BASED_OUTPATIENT_CLINIC_OR_DEPARTMENT_OTHER)
Admission: RE | Admit: 2012-02-02 | Discharge: 2012-02-02 | Disposition: A | Discharge status: Home / Self Care | Payer: Medicare Other | Source: Ambulatory Visit | Attending: Family Medicine | Admitting: Family Medicine

## 2012-02-02 VITALS — BP 161/90 | HR 76 | Ht 66.0 in | Wt 245.0 lb

## 2012-02-02 DIAGNOSIS — M79605 Pain in left leg: Secondary | ICD-10-CM

## 2012-02-02 DIAGNOSIS — M5126 Other intervertebral disc displacement, lumbar region: Secondary | ICD-10-CM | POA: Insufficient documentation

## 2012-02-02 DIAGNOSIS — M549 Dorsalgia, unspecified: Secondary | ICD-10-CM

## 2012-02-02 DIAGNOSIS — M545 Low back pain, unspecified: Secondary | ICD-10-CM | POA: Insufficient documentation

## 2012-02-02 MED ORDER — PREDNISONE 10 MG PO TABS: ORAL_TABLET | ORAL | Status: DC

## 2012-02-02 NOTE — Patient Instructions (Addendum)
You have lumbar radiculopathy (a pinched nerve in your low back). Take tylenol for baseline pain relief (1-2 extra strength tabs 3x/day) Prednisone dose pack as directed for 6 days. Consider tramadol as needed for severe pain if you're still struggling despite prednisone and tylenol. Stay as active as possible. Physical therapy has been shown to be helpful as well - start this as soon as possible. Strengthening of low back muscles, abdominal musculature are key for long term pain relief. If not improving, will consider further imaging (MRI). Follow up with me in 2 weeks if your pain is severe - otherwise I'll see you in a month.

## 2012-02-03 LAB — CULTURE, URINE COMPREHENSIVE

## 2012-02-04 ENCOUNTER — Encounter: Payer: Self-pay | Admitting: Family Medicine

## 2012-02-04 NOTE — Assessment & Plan Note (Signed)
2/2 lumbar radiculopathy.  Radiographs show multilevel DDD but no evidence of compression fracture or lytic lesions.  Start with conservative care - PT/HEP.  Prednisone dose pack and tylenol as needed.  Does not tolerate NSAIDs but did tolerate prednisone when she's had this before.  No muscle relaxants - haven't helped her and try to avoid in elderly due to risk of falls.  Consider tramadol.  F/u in 1 month, earlier if not improving.

## 2012-02-04 NOTE — Progress Notes (Signed)
  Subjective:    Patient ID: Kimberly Ferrell, female    DOB: 07/09/1940, 71 y.o.   MRN: [id number]  PCP: Dr. Beverely Low  HPI 71 yo F here for low back pain, leg pain.  Patient reports for past week she has had left side low back pain radiating into left leg. Decreased sensation of left foot. No known injury or trauma. No prior back problems. No increase in activity to cause this. Has been taking naproxen and muscle relaxants - naproxen made nauseous, relaxants didn't help much. No bowel/bladder dysfunction.  Past Medical History  Diagnosis Date  . Hypertension   . Chicken pox   . Colon polyps   . Pneumonia   . Mononucleosis   . Lung nodule seen on imaging study     noted 8/13- needs CT f/u in 1 year    Current Outpatient Prescriptions on File Prior to Visit  Medication Sig Dispense Refill  . benazepril-hydrochlorthiazide (LOTENSIN HCT) 10-12.5 MG per tablet Take 1 tablet by mouth daily.      . naproxen (NAPROSYN) 500 MG tablet Take 1 tablet (500 mg total) by mouth 2 (two) times daily with a meal.  60 tablet  0    Past Surgical History  Procedure Date  . Cholecystectomy 1992  . Abdominal hysterectomy 1993  . Hemorrhoid surgery     No Known Allergies  History   Social History  . Marital Status: Married    Spouse Name: N/A    Number of Children: N/A  . Years of Education: N/A   Occupational History  . Not on file.   Social History Main Topics  . Smoking status: Never Smoker   . Smokeless tobacco: Not on file  . Alcohol Use: No  . Drug Use: No  . Sexually Active: Not on file   Other Topics Concern  . Not on file   Social History Narrative  . No narrative on file    Family History  Problem Relation Age of Onset  . Cancer Mother   . Sudden death Father   . Hypertension Neg Hx   . Hyperlipidemia Neg Hx   . Heart attack Neg Hx   . Diabetes Neg Hx     BP 161/90  Pulse 76  Ht 5\' 6"  (1.676 m)  Wt 245 lb (111.131 kg)  BMI 39.54 kg/m2  Review of  Systems See HPI above.    Objective:   Physical Exam Gen: NAD  Back: No gross deformity, scoliosis. TTP left paraspinal region.  No midline or bony TTP. FROM. Strength LEs 5/5 all muscle groups.   2+ MSRs in patellar and achilles tendons, equal bilaterally. Positive SLR left, negative right. Sensation intact to light touch bilaterally. Negative logroll bilateral hips Negative fabers and piriformis stretches.    Assessment & Plan:  1. Low back pain - 2/2 lumbar radiculopathy.  Radiographs show multilevel DDD but no evidence of compression fracture or lytic lesions.  Start with conservative care - PT/HEP.  Prednisone dose pack and tylenol as needed.  Does not tolerate NSAIDs but did tolerate prednisone when she's had this before.  No muscle relaxants - haven't helped her and try to avoid in elderly due to risk of falls.  Consider tramadol.  F/u in 1 month, earlier if not improving.

## 2012-02-05 ENCOUNTER — Telehealth: Payer: Self-pay

## 2012-02-05 MED ORDER — NITROFURANTOIN MONOHYD MACRO 100 MG PO CAPS: 100.0000 mg | ORAL_CAPSULE | Freq: Two times a day (BID) | ORAL | Status: DC

## 2012-02-05 NOTE — Telephone Encounter (Signed)
Pt notified of urine cx and rx sent to her pharmacy.

## 2012-02-14 ENCOUNTER — Ambulatory Visit: Payer: Medicare Other | Attending: Family Medicine | Admitting: Physical Therapy

## 2012-02-14 DIAGNOSIS — M545 Low back pain, unspecified: Secondary | ICD-10-CM | POA: Insufficient documentation

## 2012-02-14 DIAGNOSIS — R293 Abnormal posture: Secondary | ICD-10-CM | POA: Insufficient documentation

## 2012-02-14 DIAGNOSIS — IMO0001 Reserved for inherently not codable concepts without codable children: Secondary | ICD-10-CM | POA: Insufficient documentation

## 2012-02-26 ENCOUNTER — Encounter: Payer: Medicare Other | Admitting: Physical Therapy

## 2012-02-26 ENCOUNTER — Encounter: Payer: Medicare Other | Admitting: Rehabilitation

## 2012-02-28 ENCOUNTER — Encounter: Payer: Medicare Other | Admitting: Physical Therapy

## 2012-05-22 ENCOUNTER — Other Ambulatory Visit: Payer: Self-pay | Admitting: Family Medicine

## 2012-05-22 DIAGNOSIS — Z1231 Encounter for screening mammogram for malignant neoplasm of breast: Secondary | ICD-10-CM

## 2012-05-30 ENCOUNTER — Encounter: Payer: Self-pay | Admitting: Family Medicine

## 2012-05-30 ENCOUNTER — Ambulatory Visit (INDEPENDENT_AMBULATORY_CARE_PROVIDER_SITE_OTHER): Payer: Medicare Other | Admitting: Family Medicine

## 2012-05-30 ENCOUNTER — Ambulatory Visit (HOSPITAL_BASED_OUTPATIENT_CLINIC_OR_DEPARTMENT_OTHER)
Admission: RE | Admit: 2012-05-30 | Discharge: 2012-05-30 | Disposition: A | Discharge status: Home / Self Care | Payer: Medicare Other | Source: Ambulatory Visit | Attending: Family Medicine | Admitting: Family Medicine

## 2012-05-30 VITALS — BP 148/78 | HR 87 | Temp 98.1°F | Ht 65.0 in | Wt 252.2 lb

## 2012-05-30 DIAGNOSIS — M545 Low back pain, unspecified: Secondary | ICD-10-CM

## 2012-05-30 DIAGNOSIS — R0982 Postnasal drip: Secondary | ICD-10-CM

## 2012-05-30 DIAGNOSIS — R350 Frequency of micturition: Secondary | ICD-10-CM

## 2012-05-30 DIAGNOSIS — S6990XA Unspecified injury of unspecified wrist, hand and finger(s), initial encounter: Secondary | ICD-10-CM

## 2012-05-30 DIAGNOSIS — W19XXXA Unspecified fall, initial encounter: Secondary | ICD-10-CM | POA: Insufficient documentation

## 2012-05-30 DIAGNOSIS — S6980XA Other specified injuries of unspecified wrist, hand and finger(s), initial encounter: Secondary | ICD-10-CM

## 2012-05-30 LAB — POCT URINALYSIS DIPSTICK
Glucose, UA: NEGATIVE
Ketones, UA: NEGATIVE
Spec Grav, UA: 1.025
Urobilinogen, UA: 0.2

## 2012-05-30 MED ORDER — CEPHALEXIN 500 MG PO CAPS: 500.0000 mg | ORAL_CAPSULE | Freq: Two times a day (BID) | ORAL | Status: AC

## 2012-05-30 NOTE — Progress Notes (Signed)
  Subjective:    Patient ID: Kimberly Ferrell, female    DOB: 1940-09-16, 72 y.o.   MRN: [id number]  HPI ? UTI- sxs started 1 week ago w/ low back pain.  Denies burning.  Urine is dark in color.  + increased frequency, urgency.  Hemoptysis- pt had CT done August 2013 that showed small pulmonary nodule.  Pt is having her kitchen remodeled and everything is very dusty.  Has been having increased coughing.  1 episode of blood tinged sputum.  Husband reports wheezing at night.  Denies SOB.  Fall- occurred this morning while sitting on the edge of the bed putting on her shoes.  Arms failed out sideways as she slid off the edge, smacking her L ring finger on the dresser.  Now very painful, bruised, swollen.   Review of Systems For ROS see HPI     Objective:   Physical Exam  Vitals reviewed. Constitutional: She appears well-developed and well-nourished. No distress.  HENT:  Head: Normocephalic and atraumatic.  Right Ear: Tympanic membrane normal.  Left Ear: Tympanic membrane normal.  Nose: Mucosal edema and rhinorrhea present. Right sinus exhibits no maxillary sinus tenderness and no frontal sinus tenderness. Left sinus exhibits no maxillary sinus tenderness and no frontal sinus tenderness.  Mouth/Throat: Mucous membranes are normal. Posterior oropharyngeal erythema (w/ PND) present.  Eyes: Conjunctivae normal and EOM are normal. Pupils are equal, round, and reactive to light.  Neck: Normal range of motion. Neck supple.  Cardiovascular: Normal rate, regular rhythm and normal heart sounds.   Pulmonary/Chest: Effort normal and breath sounds normal. No respiratory distress. She has no wheezes. She has no rales.  Abdominal: Soft. Bowel sounds are normal. She exhibits no distension. There is no tenderness (no suprapubic or CVA tenderness).  Musculoskeletal:       L ring finger w/ marked swelling, pain w/ palpation and movement of PIP joint, starting to bruise  Lymphadenopathy:    She has no  cervical adenopathy.          Assessment & Plan:

## 2012-05-30 NOTE — Patient Instructions (Addendum)
Go to [facility] on [address] to get your xray- we'll call you with your results ICE! Tylenol as needed for pain Drink plenty of fluids Start the Keflex for the UTI Start Claritin or Zyrtec daily for allergy congestion, post nasal drip, cough Call with any questions or concerns Hang in there!!!

## 2012-05-31 ENCOUNTER — Telehealth: Payer: Self-pay | Admitting: *Deleted

## 2012-05-31 DIAGNOSIS — S62609A Fracture of unspecified phalanx of unspecified finger, initial encounter for closed fracture: Secondary | ICD-10-CM

## 2012-05-31 NOTE — Telephone Encounter (Signed)
Spoke with the pt and informed her of recent finger x-ray results and note.  Pt understood and agreed to the [facility] referral.  Referral sent.//AB/CMA

## 2012-05-31 NOTE — Telephone Encounter (Signed)
Message copied by Verdie Shire on Fri May 31, 2012  8:40 AM ------      Message from: Kimberly Ferrell      Created: Thu May 30, 2012  3:50 PM       Pt w/ fracture in the joint of her ring finger- needs to see Ortho.

## 2012-06-01 LAB — URINE CULTURE: Colony Count: >=100000

## 2012-06-16 NOTE — Assessment & Plan Note (Signed)
New.  Most likely cause of pt's chronic cough- especially in setting of remodeling project.  No abnormal breath sounds on PE.  Will follow.

## 2012-06-16 NOTE — Assessment & Plan Note (Signed)
New.  UA suspicious for infxn.  Start keflex.  Await urine cx.

## 2012-06-16 NOTE — Assessment & Plan Note (Signed)
New.  Suspect finger fracture but need to determine whether it's intra-articular.  Get xrays.  Finger splinted in office.  Pain meds given.  Pt expressed understanding and is in agreement w/ plan.

## 2012-06-17 ENCOUNTER — Ambulatory Visit
Admission: RE | Admit: 2012-06-17 | Discharge: 2012-06-17 | Disposition: A | Discharge status: Home / Self Care | Payer: Medicare Other | Source: Ambulatory Visit | Attending: Family Medicine | Admitting: Family Medicine

## 2012-06-17 DIAGNOSIS — Z1231 Encounter for screening mammogram for malignant neoplasm of breast: Secondary | ICD-10-CM

## 2013-04-16 ENCOUNTER — Encounter: Payer: Self-pay | Admitting: Family Medicine

## 2013-04-16 ENCOUNTER — Encounter: Payer: Medicare Other | Admitting: Family Medicine

## 2013-04-16 ENCOUNTER — Ambulatory Visit (INDEPENDENT_AMBULATORY_CARE_PROVIDER_SITE_OTHER): Payer: Medicare Other | Admitting: Family Medicine

## 2013-04-16 VITALS — BP 150/84 | HR 83 | Temp 98.1°F | Resp 16 | Ht 65.5 in | Wt 250.5 lb

## 2013-04-16 DIAGNOSIS — N3281 Overactive bladder: Secondary | ICD-10-CM

## 2013-04-16 DIAGNOSIS — N318 Other neuromuscular dysfunction of bladder: Secondary | ICD-10-CM

## 2013-04-16 DIAGNOSIS — R911 Solitary pulmonary nodule: Secondary | ICD-10-CM

## 2013-04-16 DIAGNOSIS — J209 Acute bronchitis, unspecified: Secondary | ICD-10-CM

## 2013-04-16 DIAGNOSIS — R319 Hematuria, unspecified: Secondary | ICD-10-CM

## 2013-04-16 DIAGNOSIS — Z0000 Encounter for general adult medical examination without abnormal findings: Secondary | ICD-10-CM

## 2013-04-16 DIAGNOSIS — I10 Essential (primary) hypertension: Secondary | ICD-10-CM

## 2013-04-16 LAB — POCT URINALYSIS DIPSTICK
Bilirubin, UA: NEGATIVE
Glucose, UA: NEGATIVE
Ketones, UA: NEGATIVE
Spec Grav, UA: 1.015

## 2013-04-16 MED ORDER — AZITHROMYCIN 250 MG PO TABS: ORAL_TABLET | ORAL | Status: DC

## 2013-04-16 MED ORDER — TOLTERODINE TARTRATE ER 2 MG PO CP24: 2.0000 mg | ORAL_CAPSULE | Freq: Every day | ORAL | Status: DC

## 2013-04-16 MED ORDER — BENAZEPRIL-HYDROCHLOROTHIAZIDE 10-12.5 MG PO TABS: 1.0000 | ORAL_TABLET | Freq: Every day | ORAL | Status: DC

## 2013-04-16 MED ORDER — BENZONATATE 200 MG PO CAPS: 200.0000 mg | ORAL_CAPSULE | Freq: Three times a day (TID) | ORAL | Status: DC | PRN

## 2013-04-16 NOTE — Patient Instructions (Signed)
Follow up in 6 months to recheck BP Make sure you take your blood pressure medicine every day Start the Zpack for your bronchitis Use the cough pills (tessalon) as needed Drink plenty of fluids We'll notify you of your lab results and make any changes if needed Call with any questions or concerns Happy Holidays! Daisey Must w/ surgery!

## 2013-04-16 NOTE — Progress Notes (Signed)
   Subjective:    Patient ID: Kimberly Ferrell, female    DOB: 05-23-1940, 72 y.o.   MRN: [id number]  HPI Pre visit review using our clinic review tool, if applicable. No additional management support is needed unless otherwise documented below in the visit note.  Here today for CPE.  Risk Factors: HTN- chronic problem, is supposed to be on Lotensin HCT.  Is only taking medicine PRN.  No CP, SOB, HAs, visual changes, edema Lung nodule- noted on CT done 8/13, due for repeat scan ?TIA- 2-3 weeks ago pt got out of bed and did not know who husband.  Pt and husband report she was not fully awake and was having a nightmare.  Pt remembers the episode.  No additional episodes of confusion URI- sxs started on Saturday w/ 'awful sore throat'.  Deep, barking cough.  Cough is intermittently productive of yellow sputum.  No nasal congestion.  Subjective low grade fever.  No facial pain/pressure. Physical Activity: no regular exercise Fall Risk: low Depression: currently upset, tearful.  Very worried about her son. Hearing: normal to conversational and whispered voice ADL's: independent Cognitive: normal linear thought process, memory and attention intact Home Safety: safe at home, lives w/ husband Height, Weight, BMI, Visual Acuity: see vitals, vision corrected to 20/20 w/ glasses Counseling: UTD on mammo, colonoscopy.  Due for DEXA- prefers to wait until after knee replacement Labs Ordered: See A&P Care Plan: See A&P   Review of Systems Patient reports no vision/ hearing changes, adenopathy,fever, weight change, persistant/recurrent hoarseness, chest pain, palpitations, edema, persistant/recurrent cough, hemoptysis, dyspnea (rest/exertional/paroxysmal nocturnal), gastrointestinal bleeding (melena, rectal bleeding), abdominal pain, significant heartburn, bowel changes, Gyn symptoms (abnormal  bleeding, pain),  syncope, focal weakness, memory loss, numbness & tingling, skin/hair/nail changes, abnormal  bruising or bleeding, anxiety, or depression.  + dysphagia w/ meat + OAB- waking ~5x/night    Objective:   Physical Exam  Vitals reviewed. Constitutional: She is oriented to person, place, and time. She appears well-developed and well-nourished. No distress.  HENT:  Head: Normocephalic and atraumatic.  TMs normal bilaterally Mild nasal congestion Throat w/out erythema, edema, or exudate  Eyes: Conjunctivae and EOM are normal. Pupils are equal, round, and reactive to light.  Neck: Normal range of motion. Neck supple.  Cardiovascular: Normal rate, regular rhythm, normal heart sounds and intact distal pulses.   No murmur heard. Pulmonary/Chest: Effort normal and breath sounds normal. No respiratory distress. She has no wheezes.  + hacking cough  Abdominal: Soft. Bowel sounds are normal. She exhibits no distension and no mass. There is no tenderness. There is no rebound and no guarding.  Genitourinary:  deferred  Musculoskeletal: She exhibits no edema.  Lymphadenopathy:    She has no cervical adenopathy.  Neurological: She is alert and oriented to person, place, and time. No cranial nerve deficit. Coordination normal.  Skin: Skin is warm and dry. No rash noted. No erythema.  Psychiatric:  Tearful, anxious          Assessment & Plan:

## 2013-04-17 LAB — HEPATIC FUNCTION PANEL
ALT: 57 U/L — ABNORMAL HIGH (ref 0–35)
AST: 60 U/L — ABNORMAL HIGH (ref 0–37)
Bilirubin, Direct: 0.1 mg/dL (ref 0.0–0.3)
Total Bilirubin: 0.8 mg/dL (ref 0.3–1.2)
Total Protein: 7.3 g/dL (ref 6.0–8.3)

## 2013-04-17 LAB — LIPID PANEL
Cholesterol: 136 mg/dL (ref 0–200)
HDL: 33.3 mg/dL — ABNORMAL LOW (ref >39.00)
Total CHOL/HDL Ratio: 4
Triglycerides: 116 mg/dL (ref 0.0–149.0)

## 2013-04-17 LAB — CBC WITH DIFFERENTIAL/PLATELET
Basophils Absolute: 0.1 10*3/uL (ref 0.0–0.1)
Eosinophils Absolute: 0.3 10*3/uL (ref 0.0–0.7)
HCT: 41.6 % (ref 36.0–46.0)
Lymphs Abs: 2.6 10*3/uL (ref 0.7–4.0)
MCV: 93.7 fl (ref 78.0–100.0)
Monocytes Absolute: 0.7 10*3/uL (ref 0.1–1.0)
Neutrophils Relative %: 44.6 % (ref 43.0–77.0)
Platelets: 150 10*3/uL (ref 150.0–400.0)
RDW: 13.6 % (ref 11.5–14.6)
WBC: 6.7 10*3/uL (ref 4.5–10.5)

## 2013-04-17 LAB — BASIC METABOLIC PANEL
BUN: 9 mg/dL (ref 6–23)
CO2: 28 mEq/L (ref 19–32)
Calcium: 9.1 mg/dL (ref 8.4–10.5)
Chloride: 105 mEq/L (ref 96–112)
Creatinine, Ser: 0.7 mg/dL (ref 0.4–1.2)
GFR: 81.93 mL/min (ref >60.00)

## 2013-04-17 LAB — TSH: TSH: 1.33 u[IU]/mL (ref 0.35–5.50)

## 2013-04-18 ENCOUNTER — Other Ambulatory Visit: Payer: Self-pay | Admitting: Family Medicine

## 2013-04-18 DIAGNOSIS — R945 Abnormal results of liver function studies: Secondary | ICD-10-CM

## 2013-04-18 LAB — URINE CULTURE
Colony Count: NO GROWTH
Organism ID, Bacteria: NO GROWTH

## 2013-04-20 NOTE — Assessment & Plan Note (Signed)
New.  Start abx.  Cough meds prn.  Reviewed supportive care and red flags that should prompt return.  Pt expressed understanding and is in agreement w/ plan.  

## 2013-04-20 NOTE — Assessment & Plan Note (Signed)
Pt's PE WNL w/ exception of elevated BP.  UTD on health maintenance w/ exception of DEXA and pt prefers to wait until after TKR.  Check labs.  Anticipatory guidance provided.

## 2013-04-20 NOTE — Assessment & Plan Note (Signed)
Chronic problem, deteriorated.  Pt admits to not taking meds as directed- 'only as needed'.  Stressed need to take meds daily.  Will follow.

## 2013-04-20 NOTE — Assessment & Plan Note (Signed)
New.  Start meds to better control sxs.  Pt expressed understanding and is in agreement w/ plan.

## 2013-04-20 NOTE — Assessment & Plan Note (Signed)
Repeat CT ordered to assess stability.

## 2013-04-22 ENCOUNTER — Other Ambulatory Visit: Payer: Self-pay | Admitting: *Deleted

## 2013-04-22 ENCOUNTER — Ambulatory Visit
Admission: RE | Admit: 2013-04-22 | Discharge: 2013-04-22 | Disposition: A | Discharge status: Home / Self Care | Payer: Medicare Other | Source: Ambulatory Visit | Attending: Family Medicine | Admitting: Family Medicine

## 2013-04-22 DIAGNOSIS — R911 Solitary pulmonary nodule: Secondary | ICD-10-CM

## 2013-04-22 MED ORDER — SOLIFENACIN SUCCINATE 5 MG PO TABS: 5.0000 mg | ORAL_TABLET | Freq: Every day | ORAL | Status: DC

## 2013-04-22 NOTE — Telephone Encounter (Signed)
Medication change because of ins. from Tolterodine ER to Vesicare 5mg  per Dr. Beverely Low.  New rx sent to the pharmacy([facility]) by e-script.//AB/CMA

## 2013-04-23 ENCOUNTER — Encounter: Payer: Self-pay | Admitting: *Deleted

## 2013-05-05 ENCOUNTER — Other Ambulatory Visit (INDEPENDENT_AMBULATORY_CARE_PROVIDER_SITE_OTHER): Payer: Medicare Other

## 2013-05-05 ENCOUNTER — Encounter: Payer: Self-pay | Admitting: General Practice

## 2013-05-05 DIAGNOSIS — R7989 Other specified abnormal findings of blood chemistry: Secondary | ICD-10-CM

## 2013-05-05 DIAGNOSIS — R945 Abnormal results of liver function studies: Secondary | ICD-10-CM

## 2013-05-05 LAB — HEPATIC FUNCTION PANEL
AST: 48 U/L — ABNORMAL HIGH (ref 0–37)
Bilirubin, Direct: 0.2 mg/dL (ref 0.0–0.3)
Total Bilirubin: 1 mg/dL (ref 0.3–1.2)

## 2013-05-06 ENCOUNTER — Encounter (HOSPITAL_COMMUNITY): Payer: Self-pay | Admitting: Pharmacy Technician

## 2013-05-09 ENCOUNTER — Telehealth: Payer: Self-pay | Admitting: *Deleted

## 2013-05-09 MED ORDER — BISOPROLOL-HYDROCHLOROTHIAZIDE 2.5-6.25 MG PO TABS: 1.0000 | ORAL_TABLET | Freq: Every day | ORAL | Status: DC

## 2013-05-09 NOTE — Telephone Encounter (Signed)
Bisoprolol 2.5-6.25mg  was sent to pharmacy.

## 2013-05-09 NOTE — Telephone Encounter (Signed)
We have no hx of pt being on Bisoprolol HCTZ and when she was here for her appt, we refilled the med she was on (benazepril HCTZ).  Is pt sure the BP med is what's making her dizzy and sick?  Pain meds from surgery can also cause this.

## 2013-05-09 NOTE — Telephone Encounter (Signed)
If pt would like to switch back to bisoprolol HCTZ I need to know the dose she was on so I can prescribe it

## 2013-05-09 NOTE — Telephone Encounter (Signed)
Patient called and stated that she was prescribed benazepril HCT and that it is making her dizzy and sick. Patient states that she was on bisoprolol HCT before and she did very well with medication. Patient would like to know if she can switch back to that medication. Please advise. SW

## 2013-05-12 ENCOUNTER — Other Ambulatory Visit: Payer: Self-pay | Admitting: Orthopedic Surgery

## 2013-05-14 ENCOUNTER — Encounter (HOSPITAL_COMMUNITY)
Admission: RE | Admit: 2013-05-14 | Discharge: 2013-05-14 | Disposition: A | Discharge status: Home / Self Care | Payer: Medicare Other | Source: Ambulatory Visit | Attending: Orthopedic Surgery | Admitting: Orthopedic Surgery

## 2013-05-14 ENCOUNTER — Encounter (HOSPITAL_COMMUNITY): Payer: Self-pay

## 2013-05-14 DIAGNOSIS — Z01818 Encounter for other preprocedural examination: Secondary | ICD-10-CM | POA: Insufficient documentation

## 2013-05-14 DIAGNOSIS — Z01812 Encounter for preprocedural laboratory examination: Secondary | ICD-10-CM | POA: Insufficient documentation

## 2013-05-14 HISTORY — DX: Rosacea, unspecified: L71.9

## 2013-05-14 HISTORY — DX: Adverse effect of unspecified anesthetic, initial encounter: T41.45XA

## 2013-05-14 HISTORY — DX: Other complications of anesthesia, initial encounter: T88.59XA

## 2013-05-14 HISTORY — DX: Unspecified hemorrhoids: K64.9

## 2013-05-14 HISTORY — DX: Gastro-esophageal reflux disease without esophagitis: K21.9

## 2013-05-14 HISTORY — DX: Unspecified osteoarthritis, unspecified site: M19.90

## 2013-05-14 HISTORY — DX: Transient cerebral ischemic attack, unspecified: G45.9

## 2013-05-14 LAB — URINALYSIS, ROUTINE W REFLEX MICROSCOPIC
Bilirubin Urine: NEGATIVE
Glucose, UA: NEGATIVE mg/dL
Hgb urine dipstick: NEGATIVE
Ketones, ur: NEGATIVE mg/dL
Leukocytes, UA: NEGATIVE
Nitrite: NEGATIVE
Protein, ur: NEGATIVE mg/dL
Specific Gravity, Urine: 1.005 (ref 1.005–1.030)
Urobilinogen, UA: 0.2 mg/dL (ref 0.0–1.0)
pH: 7 (ref 5.0–8.0)

## 2013-05-14 LAB — CBC
HCT: 43.2 % (ref 36.0–46.0)
Hemoglobin: 14.6 g/dL (ref 12.0–15.0)
MCH: 32.1 pg (ref 26.0–34.0)
MCHC: 33.8 g/dL (ref 30.0–36.0)
MCV: 94.9 fL (ref 78.0–100.0)
Platelets: 176 10*3/uL (ref 150–400)
RBC: 4.55 MIL/uL (ref 3.87–5.11)
RDW: 13.8 % (ref 11.5–15.5)
WBC: 7.8 10*3/uL (ref 4.0–10.5)

## 2013-05-14 LAB — PROTIME-INR
INR: 0.92 (ref 0.00–1.49)
Prothrombin Time: 12.2 seconds (ref 11.6–15.2)

## 2013-05-14 LAB — SURGICAL PCR SCREEN: Staphylococcus aureus: NEGATIVE

## 2013-05-14 LAB — COMPREHENSIVE METABOLIC PANEL WITH GFR
ALT: 46 U/L — ABNORMAL HIGH (ref 0–35)
AST: 47 U/L — ABNORMAL HIGH (ref 0–37)
Albumin: 3.9 g/dL (ref 3.5–5.2)
Alkaline Phosphatase: 98 U/L (ref 39–117)
BUN: 7 mg/dL (ref 6–23)
CO2: 28 meq/L (ref 19–32)
Calcium: 9.9 mg/dL (ref 8.4–10.5)
Chloride: 103 meq/L (ref 96–112)
Creatinine, Ser: 0.67 mg/dL (ref 0.50–1.10)
GFR calc Af Amer: >90 mL/min
GFR calc non Af Amer: 86 mL/min — ABNORMAL LOW
Glucose, Bld: 109 mg/dL — ABNORMAL HIGH (ref 70–99)
Potassium: 4.5 meq/L (ref 3.7–5.3)
Sodium: 141 meq/L (ref 137–147)
Total Bilirubin: 0.5 mg/dL (ref 0.3–1.2)
Total Protein: 7.5 g/dL (ref 6.0–8.3)

## 2013-05-14 LAB — APTT: aPTT: 33 seconds (ref 24–37)

## 2013-05-14 NOTE — Patient Instructions (Signed)
20 BEYZA BELLINO  05/14/2013   Your procedure is scheduled on: 05/23/13  Report to [facility] at 8:45 AM.  Call this number if you have problems the morning of surgery [phone]: [phone]   Remember:   Do not eat food or drink liquids After Midnight.     Take these medicines the morning of surgery with A SIP OF WATER: azithromycin   Do not wear jewelry, make-up or nail polish.  Do not wear lotions, powders, or perfumes. You may wear deodorant.  Do not shave 48 hours prior to surgery. Men may shave face and neck.  Do not bring valuables to the hospital.  Contacts, dentures or bridgework may not be worn into surgery.  Leave suitcase in the car. After surgery it may be brought to your room.  For patients admitted to the hospital, checkout time is 11:00 AM the day of discharge.   Please read over the following fact sheets that you were given: MRSA Information, blood fact sheet, incentive spirometry fact sheet Birdie Sons, RN  pre op nurse call if needed [phone]    FAILURE TO FOLLOW THESE INSTRUCTIONS MAY RESULT IN CANCELLATION OF YOUR SURGERY   Patient Signature: ___________________________________________

## 2013-05-14 NOTE — Progress Notes (Addendum)
CT chest 04/23/13 on EP[facility], EKG 04/16/13 on EPIC, surgery clearance note 04/16/13 Dr. Beverely Low on chart, LOV note Dr. Beverely Low 04/16/13 on chart

## 2013-05-14 NOTE — H&P (Signed)
TOTAL KNEE ADMISSION H&P  Patient is being admitted for left total knee arthroplasty.  Subjective:  Chief Complaint:left knee pain.  HPI: Kimberly Ferrell, 72 y.o. female, has a history of pain and functional disability in the left knee due to arthritis and has failed non-surgical conservative treatments for greater than 12 weeks to includeNSAID's and/or analgesics, corticosteriod injections and activity modification.  Onset of symptoms was gradual, starting 5 years ago with gradually worsening course since that time. The patient noted prior procedures on the knee to include  arthroscopy and menisectomy on the left knee(s).  Patient currently rates pain in the left knee(s) at 6 out of 10 with activity. Patient has night pain, worsening of pain with activity and weight bearing, pain that interferes with activities of daily living, pain with passive range of motion and joint swelling.  Patient has evidence of periarticular osteophytes and joint space narrowing by imaging studies.  There is no active infection.  Patient Active Problem List   Diagnosis Date Noted  . Routine general medical examination at a health care facility 04/16/2013  . OAB (overactive bladder) 04/16/2013  . Acute bronchitis 04/16/2013  . Post-nasal drip 05/30/2012  . Finger injury 05/30/2012  . Myalgia and myositis 01/30/2012  . Lung nodule seen on imaging study 01/23/2012  . Flank pain 01/22/2012  . Radicular low back pain 01/22/2012  . Rosacea 06/25/2011  . HTN (hypertension) 06/15/2011  . Back pain 06/15/2011  . Urinary frequency 06/15/2011   Past Medical History  Diagnosis Date  . Hypertension   . Chicken pox as child  . Colon polyps   . Mononucleosis   . Lung nodule seen on imaging study     noted 8/13- needs CT f/u in 1 year  . Pneumonia     hx of  . TIA (transient ischemic attack) 2007    from IV dye, no residual, passed out and was told by RN that it was TIA  . GERD (gastroesophageal reflux disease)      occasional  . Arthritis   . Hemorrhoids     "sometimes"  . Complication of anesthesia     "hard to wake up"  . Rosacea     Past Surgical History  Procedure Laterality Date  . Cholecystectomy  1992  . Abdominal hysterectomy  1993  . Hemorrhoid surgery  1960's     Current outpatient prescriptions: azithromycin (ZITHROMAX) 250 MG tablet, Take 250 mg by mouth. Take on Monday Wednesday and fridays, Disp: , Rfl: ;   benazepril-hydrochlorthiazide (LOTENSIN HCT) 10-12.5 MG per tablet, Take 1 tablet by mouth daily with breakfast., Disp: , Rfl: ;   bisoprolol-hydrochlorothiazide (ZIAC) 2.5-6.25 MG per tablet, Take 1 tablet by mouth daily., Disp: 30 tablet, Rfl: 1  Allergies  Allergen Reactions  . Other Other (See Comments)    IV dye, heart stopped    History  Substance Use Topics  . Smoking status: Never Smoker   . Smokeless tobacco: Never Used  . Alcohol Use: No    Family History  Problem Relation Age of Onset  . Cancer Mother   . Sudden death Father   . Hypertension Neg Hx   . Hyperlipidemia Neg Hx   . Heart attack Neg Hx   . Diabetes Neg Hx      Review of Systems  Constitutional: Negative.   HENT: Negative.   Eyes: Negative.   Respiratory: Negative.   Cardiovascular: Negative.   Gastrointestinal: Negative.   Genitourinary: Positive for frequency. Negative for dysuria,  urgency, hematuria and flank pain.  Musculoskeletal: Positive for joint pain. Negative for back pain, falls, myalgias and neck pain.       Left knee pain  Skin: Negative.   Neurological: Negative.   Endo/Heme/Allergies: Negative.   Psychiatric/Behavioral: Negative.     Objective:  Physical Exam  Musculoskeletal:       Right hip: Normal.       Left hip: Normal.       Right knee: Normal.       Left knee: She exhibits decreased range of motion and swelling. She exhibits no effusion and no erythema. Tenderness found. Medial joint line and lateral joint line tenderness noted.       Right lower  leg: She exhibits no tenderness and no swelling.       Left lower leg: She exhibits no tenderness and no swelling.  Her left knee shows no effusion. Range is about 10 to 125 with marked crepitus on range of motion. She is tender medial greater than lateral with no instability noted. Her right knee shows no effusion. Range is about 5 to 125. No crepitus on range of motion    Vital signs in last 24 hours: Temp:  [97.7 F (36.5 C)] 97.7 F (36.5 C) (12/31 1108) Pulse Rate:  [74] 74 (12/31 1108) Resp:  [18] 18 (12/31 1108) BP: (171)/(91) 171/91 mmHg (12/31 1108) SpO2:  [98 %] 98 % (12/31 1108)   Weight: 245 lb Height: 66 in Body Surface Area: 2.27 m Body Mass Index: 39.54 kg/m  Imaging Review Plain radiographs demonstrate severe degenerative joint disease of the left knee(s). The overall alignment ismild varus. The bone quality appears to be adequate for age and reported activity level.  Assessment/Plan:  End stage arthritis, left knee   The patient history, physical examination, clinical judgment of the provider and imaging studies are consistent with end stage degenerative joint disease of the left knee(s) and total knee arthroplasty is deemed medically necessary. The treatment options including medical management, injection therapy arthroscopy and arthroplasty were discussed at length. The risks and benefits of total knee arthroplasty were presented and reviewed. The risks due to aseptic loosening, infection, stiffness, patella tracking problems, thromboembolic complications and other imponderables were discussed. The patient acknowledged the explanation, agreed to proceed with the plan and consent was signed. Patient is being admitted for inpatient treatment for surgery, pain control, PT, OT, prophylactic antibiotics, VTE prophylaxis, progressive ambulation and ADL's and discharge planning. The patient is planning to be discharged to skilled nursing facility ([facility])    Dimitri Ped, PA-C

## 2013-05-16 ENCOUNTER — Other Ambulatory Visit: Payer: Self-pay | Admitting: *Deleted

## 2013-05-16 MED ORDER — BISOPROLOL-HYDROCHLOROTHIAZIDE 2.5-6.25 MG PO TABS: 1.0000 | ORAL_TABLET | Freq: Every day | ORAL | Status: DC

## 2013-05-23 ENCOUNTER — Inpatient Hospital Stay (HOSPITAL_COMMUNITY)
Admission: RE | Admit: 2013-05-23 | Discharge: 2013-05-26 | DRG: 470 | Disposition: A | Discharge status: Skilled Nursing Facility | Payer: Medicare Other | Source: Ambulatory Visit | Attending: Orthopedic Surgery | Admitting: Orthopedic Surgery

## 2013-05-23 ENCOUNTER — Encounter (HOSPITAL_COMMUNITY): Payer: Medicare Other | Admitting: Anesthesiology

## 2013-05-23 ENCOUNTER — Inpatient Hospital Stay (HOSPITAL_COMMUNITY): Payer: Medicare Other | Admitting: Anesthesiology

## 2013-05-23 ENCOUNTER — Encounter (HOSPITAL_COMMUNITY): Payer: Self-pay | Admitting: *Deleted

## 2013-05-23 ENCOUNTER — Encounter (HOSPITAL_COMMUNITY)
Admission: RE | Disposition: A | Discharge status: Skilled Nursing Facility | Payer: Self-pay | Source: Ambulatory Visit | Attending: Orthopedic Surgery

## 2013-05-23 DIAGNOSIS — M1710 Unilateral primary osteoarthritis, unspecified knee: Principal | ICD-10-CM | POA: Diagnosis present

## 2013-05-23 DIAGNOSIS — K219 Gastro-esophageal reflux disease without esophagitis: Secondary | ICD-10-CM | POA: Diagnosis present

## 2013-05-23 DIAGNOSIS — Z79899 Other long term (current) drug therapy: Secondary | ICD-10-CM

## 2013-05-23 DIAGNOSIS — Z6841 Body Mass Index (BMI) 40.0 and over, adult: Secondary | ICD-10-CM

## 2013-05-23 DIAGNOSIS — Z96652 Presence of left artificial knee joint: Secondary | ICD-10-CM

## 2013-05-23 DIAGNOSIS — Z860100 Personal history of colon polyps, unspecified: Secondary | ICD-10-CM

## 2013-05-23 DIAGNOSIS — I10 Essential (primary) hypertension: Secondary | ICD-10-CM | POA: Diagnosis present

## 2013-05-23 DIAGNOSIS — Z8673 Personal history of transient ischemic attack (TIA), and cerebral infarction without residual deficits: Secondary | ICD-10-CM

## 2013-05-23 DIAGNOSIS — M179 Osteoarthritis of knee, unspecified: Secondary | ICD-10-CM

## 2013-05-23 DIAGNOSIS — D62 Acute posthemorrhagic anemia: Secondary | ICD-10-CM

## 2013-05-23 HISTORY — PX: TOTAL KNEE ARTHROPLASTY: SHX125

## 2013-05-23 LAB — TYPE AND SCREEN
ABO/RH(D): O POS
Antibody Screen: NEGATIVE

## 2013-05-23 LAB — ABO/RH: ABO/RH(D): O POS

## 2013-05-23 SURGERY — ARTHROPLASTY, KNEE, TOTAL
Anesthesia: General | Site: Knee | Laterality: Left

## 2013-05-23 MED ORDER — DEXAMETHASONE SODIUM PHOSPHATE 10 MG/ML IJ SOLN
10.0000 mg | Freq: Once | INTRAMUSCULAR | Status: AC
  Administered 2013-05-23: 10 mg via INTRAVENOUS

## 2013-05-23 MED ORDER — SODIUM CHLORIDE 0.9 % IV SOLN
INTRAVENOUS | Status: DC
Start: 2013-05-23 — End: 2013-05-23

## 2013-05-23 MED ORDER — FENTANYL CITRATE 0.05 MG/ML IJ SOLN
INTRAMUSCULAR | Status: DC | PRN
  Administered 2013-05-23: 50 ug via INTRAVENOUS
  Administered 2013-05-23 (×2): 100 ug via INTRAVENOUS

## 2013-05-23 MED ORDER — PHENOL 1.4 % MT LIQD: 1.0000 | OROMUCOSAL | Status: DC | PRN

## 2013-05-23 MED ORDER — ROCURONIUM BROMIDE 100 MG/10ML IV SOLN
INTRAVENOUS | Status: DC | PRN
Start: 2013-05-23 — End: 2013-05-23
  Administered 2013-05-23: 30 mg via INTRAVENOUS

## 2013-05-23 MED ORDER — SODIUM CHLORIDE 0.9 % IJ SOLN
INTRAMUSCULAR | Status: DC | PRN
  Administered 2013-05-23: 30 mL via INTRAVENOUS

## 2013-05-23 MED ORDER — MENTHOL 3 MG MT LOZG
1.0000 | LOZENGE | OROMUCOSAL | Status: DC | PRN
Start: 2013-05-23 — End: 2013-05-26

## 2013-05-23 MED ORDER — BISOPROLOL FUMARATE 5 MG PO TABS
2.5000 mg | ORAL_TABLET | Freq: Once | ORAL | Status: AC
  Administered 2013-05-23: 2.5 mg via ORAL
  Filled 2013-05-23: qty 0.5

## 2013-05-23 MED ORDER — OXYCODONE HCL 5 MG PO TABS
5.0000 mg | ORAL_TABLET | ORAL | Status: DC | PRN
  Administered 2013-05-23: 5 mg via ORAL
  Administered 2013-05-24 – 2013-05-26 (×6): 10 mg via ORAL
  Filled 2013-05-23 (×6): qty 2
  Filled 2013-05-23: qty 1

## 2013-05-23 MED ORDER — PROPOFOL 10 MG/ML IV BOLUS
INTRAVENOUS | Status: DC | PRN
  Administered 2013-05-23: 200 mg via INTRAVENOUS

## 2013-05-23 MED ORDER — FENTANYL CITRATE 0.05 MG/ML IJ SOLN
INTRAMUSCULAR | Status: AC
  Filled 2013-05-23: qty 5

## 2013-05-23 MED ORDER — SODIUM CHLORIDE 0.9 % IV SOLN
INTRAVENOUS | Status: DC
  Administered 2013-05-23 – 2013-05-24 (×2): via INTRAVENOUS

## 2013-05-23 MED ORDER — LABETALOL HCL 5 MG/ML IV SOLN
INTRAVENOUS | Status: DC | PRN
  Administered 2013-05-23: 10 mg via INTRAVENOUS
  Administered 2013-05-23: 5 mg via INTRAVENOUS

## 2013-05-23 MED ORDER — DEXAMETHASONE 6 MG PO TABS
10.0000 mg | ORAL_TABLET | Freq: Every day | ORAL | Status: AC
  Administered 2013-05-24: 10:00:00 10 mg via ORAL
  Filled 2013-05-23: qty 1

## 2013-05-23 MED ORDER — ACETAMINOPHEN 325 MG PO TABS
650.0000 mg | ORAL_TABLET | Freq: Four times a day (QID) | ORAL | Status: DC | PRN
  Administered 2013-05-23: 17:00:00 650 mg via ORAL
  Filled 2013-05-23: qty 2

## 2013-05-23 MED ORDER — SUCCINYLCHOLINE CHLORIDE 20 MG/ML IJ SOLN
INTRAMUSCULAR | Status: DC | PRN
  Administered 2013-05-23: 100 mg via INTRAVENOUS

## 2013-05-23 MED ORDER — SODIUM CHLORIDE 0.9 % IJ SOLN
INTRAMUSCULAR | Status: AC
  Filled 2013-05-23: qty 50

## 2013-05-23 MED ORDER — FLEET ENEMA 7-19 GM/118ML RE ENEM: 1.0000 | ENEMA | Freq: Once | RECTAL | Status: AC | PRN

## 2013-05-23 MED ORDER — ONDANSETRON HCL 4 MG PO TABS: 4.0000 mg | ORAL_TABLET | Freq: Four times a day (QID) | ORAL | Status: DC | PRN

## 2013-05-23 MED ORDER — ONDANSETRON HCL 4 MG/2ML IJ SOLN
4.0000 mg | Freq: Four times a day (QID) | INTRAMUSCULAR | Status: DC | PRN
  Administered 2013-05-23: 16:00:00 4 mg via INTRAVENOUS
  Filled 2013-05-23: qty 2

## 2013-05-23 MED ORDER — SODIUM CHLORIDE 0.9 % IR SOLN
Status: DC | PRN
  Administered 2013-05-23: 1000 mL

## 2013-05-23 MED ORDER — PROMETHAZINE HCL 25 MG/ML IJ SOLN: 6.2500 mg | INTRAMUSCULAR | Status: DC | PRN

## 2013-05-23 MED ORDER — METOCLOPRAMIDE HCL 5 MG PO TABS
5.0000 mg | ORAL_TABLET | Freq: Three times a day (TID) | ORAL | Status: DC | PRN
  Filled 2013-05-23: qty 2

## 2013-05-23 MED ORDER — DEXAMETHASONE SODIUM PHOSPHATE 10 MG/ML IJ SOLN
INTRAMUSCULAR | Status: AC
  Filled 2013-05-23: qty 1

## 2013-05-23 MED ORDER — DIPHENHYDRAMINE HCL 12.5 MG/5ML PO ELIX: 12.5000 mg | ORAL_SOLUTION | ORAL | Status: DC | PRN

## 2013-05-23 MED ORDER — CEFAZOLIN SODIUM-DEXTROSE 2-3 GM-% IV SOLR
2.0000 g | INTRAVENOUS | Status: AC
  Administered 2013-05-23: 2 g via INTRAVENOUS

## 2013-05-23 MED ORDER — RIVAROXABAN 10 MG PO TABS
10.0000 mg | ORAL_TABLET | Freq: Every day | ORAL | Status: DC
  Administered 2013-05-24 – 2013-05-26 (×3): 10 mg via ORAL
  Filled 2013-05-23 (×5): qty 1

## 2013-05-23 MED ORDER — BUPIVACAINE HCL 0.25 % IJ SOLN
INTRAMUSCULAR | Status: DC | PRN
  Administered 2013-05-23: 20 mL

## 2013-05-23 MED ORDER — BUPIVACAINE LIPOSOME 1.3 % IJ SUSP
20.0000 mL | Freq: Once | INTRAMUSCULAR | Status: DC
  Filled 2013-05-23: qty 20

## 2013-05-23 MED ORDER — GLYCOPYRROLATE 0.2 MG/ML IJ SOLN
INTRAMUSCULAR | Status: DC | PRN
  Administered 2013-05-23: .6 mg via INTRAVENOUS

## 2013-05-23 MED ORDER — ONDANSETRON HCL 4 MG/2ML IJ SOLN
INTRAMUSCULAR | Status: AC
  Filled 2013-05-23: qty 2

## 2013-05-23 MED ORDER — BUPIVACAINE LIPOSOME 1.3 % IJ SUSP
INTRAMUSCULAR | Status: DC | PRN
  Administered 2013-05-23: 20 mL

## 2013-05-23 MED ORDER — BISOPROLOL-HYDROCHLOROTHIAZIDE 2.5-6.25 MG PO TABS
1.0000 | ORAL_TABLET | Freq: Every day | ORAL | Status: DC
  Administered 2013-05-23 – 2013-05-26 (×4): 1 via ORAL
  Filled 2013-05-23 (×4): qty 1

## 2013-05-23 MED ORDER — LABETALOL HCL 5 MG/ML IV SOLN
INTRAVENOUS | Status: AC
  Filled 2013-05-23: qty 4

## 2013-05-23 MED ORDER — METHOCARBAMOL 500 MG PO TABS
500.0000 mg | ORAL_TABLET | Freq: Four times a day (QID) | ORAL | Status: DC | PRN
  Administered 2013-05-23 – 2013-05-26 (×4): 500 mg via ORAL
  Filled 2013-05-23 (×4): qty 1

## 2013-05-23 MED ORDER — LACTATED RINGERS IV SOLN
INTRAVENOUS | Status: DC
Start: 2013-05-23 — End: 2013-05-23
  Administered 2013-05-23: 12:00:00 via INTRAVENOUS
  Administered 2013-05-23: 1000 mL via INTRAVENOUS

## 2013-05-23 MED ORDER — METHOCARBAMOL 100 MG/ML IJ SOLN
500.0000 mg | Freq: Four times a day (QID) | INTRAVENOUS | Status: DC | PRN
  Administered 2013-05-23: 500 mg via INTRAVENOUS
  Filled 2013-05-23: qty 5

## 2013-05-23 MED ORDER — DOCUSATE SODIUM 100 MG PO CAPS
100.0000 mg | ORAL_CAPSULE | Freq: Two times a day (BID) | ORAL | Status: DC
  Administered 2013-05-23 – 2013-05-26 (×6): 100 mg via ORAL

## 2013-05-23 MED ORDER — KETOROLAC TROMETHAMINE 15 MG/ML IJ SOLN: 7.5000 mg | Freq: Four times a day (QID) | INTRAMUSCULAR | Status: AC | PRN

## 2013-05-23 MED ORDER — HYDROMORPHONE HCL PF 1 MG/ML IJ SOLN
INTRAMUSCULAR | Status: AC
  Filled 2013-05-23: qty 1

## 2013-05-23 MED ORDER — PROPOFOL 10 MG/ML IV BOLUS
INTRAVENOUS | Status: AC
  Filled 2013-05-23: qty 20

## 2013-05-23 MED ORDER — ACETAMINOPHEN 500 MG PO TABS
1000.0000 mg | ORAL_TABLET | Freq: Once | ORAL | Status: AC
  Administered 2013-05-23: 1000 mg via ORAL
  Filled 2013-05-23: qty 2

## 2013-05-23 MED ORDER — NEOSTIGMINE METHYLSULFATE 1 MG/ML IJ SOLN
INTRAMUSCULAR | Status: DC | PRN
  Administered 2013-05-23: 4 mg via INTRAVENOUS

## 2013-05-23 MED ORDER — METOCLOPRAMIDE HCL 5 MG/ML IJ SOLN
5.0000 mg | Freq: Three times a day (TID) | INTRAMUSCULAR | Status: DC | PRN
  Administered 2013-05-23: 10 mg via INTRAVENOUS
  Filled 2013-05-23: qty 2

## 2013-05-23 MED ORDER — MORPHINE SULFATE 2 MG/ML IJ SOLN: 1.0000 mg | INTRAMUSCULAR | Status: DC | PRN

## 2013-05-23 MED ORDER — ONDANSETRON HCL 4 MG/2ML IJ SOLN
INTRAMUSCULAR | Status: DC | PRN
  Administered 2013-05-23: 4 mg via INTRAVENOUS

## 2013-05-23 MED ORDER — BISACODYL 10 MG RE SUPP: 10.0000 mg | Freq: Every day | RECTAL | Status: DC | PRN

## 2013-05-23 MED ORDER — NEOSTIGMINE METHYLSULFATE 1 MG/ML IJ SOLN: INTRAMUSCULAR | Status: DC | PRN

## 2013-05-23 MED ORDER — ROCURONIUM BROMIDE 100 MG/10ML IV SOLN
INTRAVENOUS | Status: AC
  Filled 2013-05-23: qty 1

## 2013-05-23 MED ORDER — GLYCOPYRROLATE 0.2 MG/ML IJ SOLN
INTRAMUSCULAR | Status: AC
  Filled 2013-05-23: qty 3

## 2013-05-23 MED ORDER — CEFAZOLIN SODIUM-DEXTROSE 2-3 GM-% IV SOLR
2.0000 g | Freq: Four times a day (QID) | INTRAVENOUS | Status: AC
  Administered 2013-05-23 (×2): 2 g via INTRAVENOUS
  Filled 2013-05-23 (×2): qty 50

## 2013-05-23 MED ORDER — BUPIVACAINE HCL (PF) 0.25 % IJ SOLN
INTRAMUSCULAR | Status: AC
  Filled 2013-05-23: qty 30

## 2013-05-23 MED ORDER — DEXAMETHASONE SODIUM PHOSPHATE 10 MG/ML IJ SOLN
10.0000 mg | Freq: Every day | INTRAMUSCULAR | Status: AC
  Filled 2013-05-23: qty 1

## 2013-05-23 MED ORDER — NEOSTIGMINE METHYLSULFATE 1 MG/ML IJ SOLN
INTRAMUSCULAR | Status: AC
  Filled 2013-05-23: qty 10

## 2013-05-23 MED ORDER — 0.9 % SODIUM CHLORIDE (POUR BTL) OPTIME
TOPICAL | Status: DC | PRN
  Administered 2013-05-23: 1000 mL

## 2013-05-23 MED ORDER — ACETAMINOPHEN 650 MG RE SUPP: 650.0000 mg | Freq: Four times a day (QID) | RECTAL | Status: DC | PRN

## 2013-05-23 MED ORDER — HYDROMORPHONE HCL PF 1 MG/ML IJ SOLN
0.2500 mg | INTRAMUSCULAR | Status: DC | PRN
  Administered 2013-05-23 (×2): 0.25 mg via INTRAVENOUS

## 2013-05-23 MED ORDER — TRAMADOL HCL 50 MG PO TABS: 50.0000 mg | ORAL_TABLET | Freq: Four times a day (QID) | ORAL | Status: DC | PRN

## 2013-05-23 MED ORDER — POLYETHYLENE GLYCOL 3350 17 G PO PACK: 17.0000 g | PACK | Freq: Every day | ORAL | Status: DC | PRN

## 2013-05-23 MED ORDER — CEFAZOLIN SODIUM-DEXTROSE 2-3 GM-% IV SOLR
INTRAVENOUS | Status: AC
  Filled 2013-05-23: qty 50

## 2013-05-23 SURGICAL SUPPLY — 61 items
BAG SPEC THK2 15X12 ZIP CLS (MISCELLANEOUS) ×1
BAG ZIPLOCK 12X15 (MISCELLANEOUS) ×3 IMPLANT
BANDAGE ELASTIC 6 VELCRO ST LF (GAUZE/BANDAGES/DRESSINGS) ×3 IMPLANT
BANDAGE ESMARK 6X9 LF (GAUZE/BANDAGES/DRESSINGS) ×1 IMPLANT
BLADE SAG 18X100X1.27 (BLADE) ×3 IMPLANT
BLADE SAW SGTL 11.0X1.19X90.0M (BLADE) ×3 IMPLANT
BNDG CMPR 9X6 STRL LF SNTH (GAUZE/BANDAGES/DRESSINGS) ×1
BNDG COHESIVE 6X5 TAN STRL LF (GAUZE/BANDAGES/DRESSINGS) ×2 IMPLANT
BNDG ESMARK 6X9 LF (GAUZE/BANDAGES/DRESSINGS) ×3
BOWL SMART MIX CTS (DISPOSABLE) ×3 IMPLANT
CAPT RP KNEE ×2 IMPLANT
CEMENT HV SMART SET (Cement) ×4 IMPLANT
CLOSURE WOUND 1/2 X4 (GAUZE/BANDAGES/DRESSINGS) ×1
CUFF TOURN SGL QUICK 34 (TOURNIQUET CUFF) ×3
CUFF TRNQT CYL 34X4X40X1 (TOURNIQUET CUFF) ×1 IMPLANT
DECANTER SPIKE VIAL GLASS SM (MISCELLANEOUS) ×3 IMPLANT
DRAPE EXTREMITY T 121X128X90 (DRAPE) ×3 IMPLANT
DRAPE POUCH INSTRU U-SHP 10X18 (DRAPES) ×3 IMPLANT
DRAPE U-SHAPE 47X51 STRL (DRAPES) ×3 IMPLANT
DRSG ADAPTIC 3X8 NADH LF (GAUZE/BANDAGES/DRESSINGS) ×3 IMPLANT
DURAPREP 26ML APPLICATOR (WOUND CARE) ×3 IMPLANT
ELECT REM PT RETURN 9FT ADLT (ELECTROSURGICAL) ×3
ELECTRODE REM PT RTRN 9FT ADLT (ELECTROSURGICAL) ×1 IMPLANT
EVACUATOR 1/8 PVC DRAIN (DRAIN) ×3 IMPLANT
FACESHIELD LNG OPTICON STERILE (SAFETY) ×13 IMPLANT
GLOVE BIO SURGEON STRL SZ7.5 (GLOVE) ×2 IMPLANT
GLOVE BIO SURGEON STRL SZ8 (GLOVE) ×3 IMPLANT
GLOVE BIOGEL PI IND STRL 7.5 (GLOVE) IMPLANT
GLOVE BIOGEL PI IND STRL 8 (GLOVE) ×2 IMPLANT
GLOVE BIOGEL PI INDICATOR 7.5 (GLOVE) ×2
GLOVE BIOGEL PI INDICATOR 8 (GLOVE) ×4
GLOVE SURG SS PI 6.5 STRL IVOR (GLOVE) IMPLANT
GLOVE SURG SS PI 7.5 STRL IVOR (GLOVE) ×6 IMPLANT
GOWN STRL REIN 3XL XLG LVL4 (GOWN DISPOSABLE) ×2 IMPLANT
GOWN STRL REUS W/TWL LRG LVL3 (GOWN DISPOSABLE) ×3 IMPLANT
GOWN STRL REUS W/TWL XL LVL3 (GOWN DISPOSABLE) ×4 IMPLANT
HANDPIECE INTERPULSE COAX TIP (DISPOSABLE) ×3
IMMOBILIZER KNEE 20 (SOFTGOODS) ×3 IMPLANT
KIT BASIN OR (CUSTOM PROCEDURE TRAY) ×3 IMPLANT
MANIFOLD NEPTUNE II (INSTRUMENTS) ×3 IMPLANT
NDL SAFETY ECLIPSE 18X1.5 (NEEDLE) ×2 IMPLANT
NEEDLE HYPO 18GX1.5 SHARP (NEEDLE) ×6
NS IRRIG 1000ML POUR BTL (IV SOLUTION) ×3 IMPLANT
PACK TOTAL JOINT (CUSTOM PROCEDURE TRAY) ×3 IMPLANT
PAD ABD 8X10 STRL (GAUZE/BANDAGES/DRESSINGS) ×3 IMPLANT
PADDING CAST COTTON 6X4 STRL (CAST SUPPLIES) ×9 IMPLANT
POSITIONER SURGICAL ARM (MISCELLANEOUS) ×3 IMPLANT
SET HNDPC FAN SPRY TIP SCT (DISPOSABLE) ×1 IMPLANT
SPONGE GAUZE 4X4 12PLY (GAUZE/BANDAGES/DRESSINGS) ×3 IMPLANT
STRIP CLOSURE SKIN 1/2X4 (GAUZE/BANDAGES/DRESSINGS) ×3 IMPLANT
SUCTION FRAZIER 12FR DISP (SUCTIONS) ×3 IMPLANT
SUT MNCRL AB 4-0 PS2 18 (SUTURE) ×3 IMPLANT
SUT VIC AB 2-0 CT1 27 (SUTURE) ×9
SUT VIC AB 2-0 CT1 TAPERPNT 27 (SUTURE) ×3 IMPLANT
SUT VLOC 180 0 24IN GS25 (SUTURE) ×3 IMPLANT
SYR 20CC LL (SYRINGE) ×3 IMPLANT
SYR 50ML LL SCALE MARK (SYRINGE) ×3 IMPLANT
TOWEL OR 17X26 10 PK STRL BLUE (TOWEL DISPOSABLE) ×6 IMPLANT
TRAY FOLEY CATH 14FRSI W/METER (CATHETERS) ×3 IMPLANT
WATER STERILE IRR 1500ML POUR (IV SOLUTION) ×5 IMPLANT
WRAP KNEE MAXI GEL POST OP (GAUZE/BANDAGES/DRESSINGS) ×3 IMPLANT

## 2013-05-23 NOTE — Preoperative (Signed)
Beta Blockers   Reason not to administer Beta Blockers:Not Applicable, taken this AM

## 2013-05-23 NOTE — Interval H&P Note (Signed)
History and Physical Interval Note:  05/23/2013 10:37 AM  Kimberly Ferrell  has presented today for surgery, with the diagnosis of OA OF LEFT KNEE  The various methods of treatment have been discussed with the patient and family. After consideration of risks, benefits and other options for treatment, the patient has consented to  Procedure(s): LEFT TOTAL KNEE ARTHROPLASTY (Left) as a surgical intervention .  The patient's history has been reviewed, patient examined, no change in status, stable for surgery.  I have reviewed the patient's chart and labs.  Questions were answered to the patient's satisfaction.     Gearlean Alf

## 2013-05-23 NOTE — Progress Notes (Signed)
Utilization review completed.  

## 2013-05-23 NOTE — Plan of Care (Signed)
Problem: Consults Goal: Diagnosis- Total Joint Replacement Left total knee     

## 2013-05-23 NOTE — Anesthesia Postprocedure Evaluation (Signed)
  Anesthesia Post-op Note  Patient: Kimberly Ferrell  Procedure(s) Performed: Procedure(s) (LRB): LEFT TOTAL KNEE ARTHROPLASTY (Left)  Patient Location: PACU  Anesthesia Type: General  Level of Consciousness: awake and alert   Airway and Oxygen Therapy: Patient Spontanous Breathing  Post-op Pain: mild  Post-op Assessment: Post-op Vital signs reviewed, Patient's Cardiovascular Status Stable, Respiratory Function Stable, Patent Airway and No signs of Nausea or vomiting  Last Vitals:  Filed Vitals:   05/23/13 1530  BP: 147/75  Pulse: 63  Temp: 36.5 C  Resp: 16    Post-op Vital Signs: stable   Complications: No apparent anesthesia complications

## 2013-05-23 NOTE — Transfer of Care (Signed)
Immediate Anesthesia Transfer of Care Note  Patient: Kimberly Ferrell  Procedure(s) Performed: Procedure(s): LEFT TOTAL KNEE ARTHROPLASTY (Left)  Patient Location: PACU  Anesthesia Type:General  Level of Consciousness: awake and patient cooperative  Airway & Oxygen Therapy: Patient Spontanous Breathing and Patient connected to face mask oxygen  Post-op Assessment: Report given to PACU RN and Post -op Vital signs reviewed and stable  Post vital signs: Reviewed and stable  Complications: No apparent anesthesia complications

## 2013-05-23 NOTE — Anesthesia Preprocedure Evaluation (Addendum)
Anesthesia Evaluation  Patient identified by MRN, date of birth, ID band Patient awake    Reviewed: Allergy & Precautions, H&P , NPO status , Patient's Chart, lab work & pertinent test results  History of Anesthesia Complications (+) history of anesthetic complications  Airway Mallampati: II TM Distance: >3 FB Neck ROM: Full    Dental no notable dental hx.    Pulmonary pneumonia -,  breath sounds clear to auscultation  Pulmonary exam normal       Cardiovascular hypertension, Pt. on medications and Pt. on home beta blockers Rhythm:Regular Rate:Normal     Neuro/Psych TIA Neuromuscular disease negative psych ROS   GI/Hepatic Neg liver ROS, GERD-  ,  Endo/Other  Morbid obesity  Renal/GU negative Renal ROS  negative genitourinary   Musculoskeletal negative musculoskeletal ROS (+)   Abdominal (+) + obese,   Peds negative pediatric ROS (+)  Hematology negative hematology ROS (+)   Anesthesia Other Findings   Reproductive/Obstetrics negative OB ROS                        Anesthesia Physical Anesthesia Plan  ASA: III  Anesthesia Plan: General   Post-op Pain Management:    Induction: Intravenous  Airway Management Planned: Oral ETT  Additional Equipment:   Intra-op Plan:   Post-operative Plan: Extubation in OR  Informed Consent: I have reviewed the patients History and Physical, chart, labs and discussed the procedure including the risks, benefits and alternatives for the proposed anesthesia with the patient or authorized representative who has indicated his/her understanding and acceptance.   Dental advisory given  Plan Discussed with: CRNA  Anesthesia Plan Comments: (Discussed r/b general versus spinal. She prefers general.)      Anesthesia Quick Evaluation

## 2013-05-23 NOTE — Op Note (Signed)
Pre-operative diagnosis- Osteoarthritis  Left knee(s)  Post-operative diagnosis- Osteoarthritis Left knee(s)  Procedure-  Left  Total Knee Arthroplasty  Surgeon- Dione Plover. Nollan Muldrow, MD  Assistant- Arlee Muslim, PA-C   Anesthesia-  General EBL-* No blood loss amount entered *  Drains Hemovac  Tourniquet time-  Total Tourniquet Time Documented: Thigh (Left) - 33 minutes Total: Thigh (Left) - 33 minutes    Complications- None  Condition-PACU - hemodynamically stable.   Brief Clinical Note  Kimberly Ferrell is a 73 y.o. year old female with end stage OA of her left knee with progressively worsening pain and dysfunction. She has constant pain, with activity and at rest and significant functional deficits with difficulties even with ADLs. She has had extensive non-op management including analgesics, injections of cortisone and viscosupplements, and home exercise program, but remains in significant pain with significant dysfunction. Radiographs show bone on bone arthritis medial and patellofemoral. She presents now for left Total Knee Arthroplasty.     Procedure in detail---   The patient is brought into the operating room and positioned supine on the operating table. After successful administration of  General,   a tourniquet is placed high on the  Left thigh(s) and the lower extremity is prepped and draped in the usual sterile fashion. Time out is performed by the operating team and then the  Left lower extremity is wrapped in Esmarch, knee flexed and the tourniquet inflated to 300 mmHg.       A midline incision is made with a ten blade through the subcutaneous tissue to the level of the extensor mechanism. A fresh blade is used to make a medial parapatellar arthrotomy. Soft tissue over the proximal medial tibia is subperiosteally elevated to the joint line with a knife and into the semimembranosus bursa with a Cobb elevator. Soft tissue over the proximal lateral tibia is elevated with  attention being paid to avoiding the patellar tendon on the tibial tubercle. The patella is everted, knee flexed 90 degrees and the ACL and PCL are removed. Findings are bone on bone medial and patellofemoral with large medial osteophytes.        The drill is used to create a starting hole in the distal femur and the canal is thoroughly irrigated with sterile saline to remove the fatty contents. The 5 degree Left  valgus alignment guide is placed into the femoral canal and the distal femoral cutting block is pinned to remove 10 mm off the distal femur. Resection is made with an oscillating saw.      The tibia is subluxed forward and the menisci are removed. The extramedullary alignment guide is placed referencing proximally at the medial aspect of the tibial tubercle and distally along the second metatarsal axis and tibial crest. The block is pinned to remove 34mm off the more deficient medial  side. Resection is made with an oscillating saw. Size 3is the most appropriate size for the tibia and the proximal tibia is prepared with the modular drill and keel punch for that size.      The femoral sizing guide is placed and size 3 is most appropriate. Rotation is marked off the epicondylar axis and confirmed by creating a rectangular flexion gap at 90 degrees. The size 3 cutting block is pinned in this rotation and the anterior, posterior and chamfer cuts are made with the oscillating saw. The intercondylar block is then placed and that cut is made.      Trial size 3 tibial component, trial size  3 posterior stabilized femur and a 10  mm posterior stabilized rotating platform insert trial is placed. Full extension is achieved with excellent varus/valgus and anterior/posterior balance throughout full range of motion. The patella is everted and thickness measured to be 22  mm. Free hand resection is taken to 12 mm, a 38 template is placed, lug holes are drilled, trial patella is placed, and it tracks normally.  Osteophytes are removed off the posterior femur with the trial in place. All trials are removed and the cut bone surfaces prepared with pulsatile lavage. Cement is mixed and once ready for implantation, the size 3 tibial implant, size  3 posterior stabilized femoral component, and the size 38 patella are cemented in place and the patella is held with the clamp. The trial insert is placed and the knee held in full extension. The Exparel (20 ml mixed with 30 ml saline) and .25% Bupivicaine, are injected into the extensor mechanism, posterior capsule, medial and lateral gutters and subcutaneous tissues.  All extruded cement is removed and once the cement is hard the permanent 10 mm posterior stabilized rotating platform insert is placed into the tibial tray.      The wound is copiously irrigated with saline solution and the extensor mechanism closed over a hemovac drain with #1 PDS suture. The tourniquet is released for a total tourniquet time of 33  minutes. Flexion against gravity is 140 degrees and the patella tracks normally. Subcutaneous tissue is closed with 2.0 vicryl and subcuticular with running 4.0 Monocryl. The incision is cleaned and dried and steri-strips and a bulky sterile dressing are applied. The limb is placed into a knee immobilizer and the patient is awakened and transported to recovery in stable condition.      Please note that a surgical assistant was a medical necessity for this procedure in order to perform it in a safe and expeditious manner. Surgical assistant was necessary to retract the ligaments and vital neurovascular structures to prevent injury to them and also necessary for proper positioning of the limb to allow for anatomic placement of the prosthesis.   Dione Plover Lewi Drost, MD    05/23/2013, 12:43 PM

## 2013-05-24 LAB — BASIC METABOLIC PANEL
BUN: 9 mg/dL (ref 6–23)
CALCIUM: 8.4 mg/dL (ref 8.4–10.5)
CO2: 26 meq/L (ref 19–32)
CREATININE: 0.68 mg/dL (ref 0.50–1.10)
Chloride: 99 mEq/L (ref 96–112)
GFR calc Af Amer: >90 mL/min (ref >90)
GFR calc non Af Amer: 85 mL/min — ABNORMAL LOW (ref >90)
Glucose, Bld: 155 mg/dL — ABNORMAL HIGH (ref 70–99)
Potassium: 4.1 mEq/L (ref 3.7–5.3)
Sodium: 137 mEq/L (ref 137–147)

## 2013-05-24 LAB — CBC
HEMATOCRIT: 35.7 % — AB (ref 36.0–46.0)
Hemoglobin: 11.8 g/dL — ABNORMAL LOW (ref 12.0–15.0)
MCH: 31.4 pg (ref 26.0–34.0)
MCHC: 33.1 g/dL (ref 30.0–36.0)
MCV: 94.9 fL (ref 78.0–100.0)
Platelets: 183 10*3/uL (ref 150–400)
RBC: 3.76 MIL/uL — AB (ref 3.87–5.11)
RDW: 13.6 % (ref 11.5–15.5)
WBC: 12.3 10*3/uL — AB (ref 4.0–10.5)

## 2013-05-24 NOTE — Progress Notes (Signed)
Clinical Social Work Department BRIEF PSYCHOSOCIAL ASSESSMENT 05/24/2013  Patient:  Kimberly Ferrell, Kimberly Ferrell     Account Number:  [id number]     Admit date:  05/22/2013  Clinical Social Worker:  Levie Heritage  Date/Time:  05/24/2013 12:01 PM  Referred by:  Physician  Date Referred:  05/24/2013 Referred for  SNF Placement   Other Referral:   Interview type:  Patient Other interview type:    PSYCHOSOCIAL DATA Living Status:  FAMILY Admitted from facility:   Level of care:   Primary support name:  Moss Mc Primary support relationship to patient:  SPOUSE Degree of support available:   strong    CURRENT CONCERNS Current Concerns  Post-Acute Placement   Other Concerns:    SOCIAL WORK ASSESSMENT / PLAN Met with Pt to discuss d/c plans.    Pt stated that she is aware that MD wants her to go to rehab and, to that end, she took it upon herself to contact [place] at [facility].  Pt stated that she met with [facility] and signed admission paperwork.    Pt stated that [facility] is the best option for her and her family, as the facility is only 3 miles from her home.    CSW thanked Pt for her time.   Assessment/plan status:  Psychosocial Support/Ongoing Assessment of Needs Other assessment/ plan:   Information/referral to community resources:   n/a--Pt has already signed admission paperwork at [facility].    PATIENT'S/FAMILY'S RESPONSE TO PLAN OF CARE: Pt was cooperative and very pleasant.    Pt stated, several times, that she wanted to get all the PT that she can so that she can get stronger and return home; Pt is motivated to walk and care for herself.  Pt is happy to go to [facility].    Pt thanked CSW for time and assistance.   Bernita Raisin, [place] [facility] [phone]

## 2013-05-24 NOTE — Progress Notes (Signed)
05/24/13 1515  PT Visit Information  Pt back in bed and having a little pain. Worked on exercises in bed (see below) as well as 10 SLRS AAROM LLE. Pt having pain and preferred to stop at this point. Feel pt had a great first session this morning this would be fine. Will see tomorrow.    Last PT Received On 05/24/13  Assistance Needed +1  PT Time Calculation  PT Start Time 1502  PT Stop Time 1515  PT Time Calculation (min) 13 min  Total Joint Exercises  Ankle Circles/Pumps AROM;Both;10 reps  Quad Sets AROM;Left;10 reps  Heel Slides AAROM;Left;10 reps  PT - End of Session  Activity Tolerance Patient tolerated treatment well  Patient left in bed;with call bell/phone within reach;with family/visitor present  PT - Assessment/Plan  PT Frequency 7X/week  Follow Up Recommendations SNF  PT equipment None recommended by PT  PT General Charges  $$ ACUTE PT VISIT 1 Procedure  PT Treatments  $Therapeutic Exercise 8-22 mins  .Clide Dales, PT Pager: [phone] 05/24/2013

## 2013-05-24 NOTE — Care Management Note (Signed)
    Page 1 of 1   05/24/2013     2:20:35 PM   CARE MANAGEMENT NOTE 05/24/2013  Patient:  Kimberly Ferrell, Kimberly Ferrell   Account Number:  [id number]  Date Initiated:  05/24/2013  Documentation initiated by:  [facility]  Subjective/Objective Assessment:   73 Y/O F ADMITTED W/OA     Action/Plan:   FROM HOME.[facility] FOLLOWING.   Anticipated DC Date:  05/26/2013   Anticipated DC Plan:  [place]  CM consult      Choice offered to / List presented to:             Status of service:  In process, will continue to follow Medicare Important Message given?   (If response is "NO", the following Medicare IM given date fields will be blank) Date Medicare IM given:   Date Additional Medicare IM given:    Discharge Disposition:    Per UR Regulation:    If discussed at Long Length of Stay Meetings, dates discussed:    Comments:  05/24/13 Donatello Kleve RN,BSN NCM [phone] POD#1 L TKA.PT-SNF.D/C PLAN SNF.

## 2013-05-24 NOTE — Progress Notes (Signed)
   Subjective: 1 Day Post-Op Procedure(s) (LRB): LEFT TOTAL KNEE ARTHROPLASTY (Left) Patient reports pain as moderate.   We will start therapy today.  Plan is to go Skilled nursing facility after hospital stay.  Objective: Vital signs in last 24 hours: Temp:  [97.6 F (36.4 C)-98.2 F (36.8 C)] 98 F (36.7 C) (01/10 0432) Pulse Rate:  [55-71] 58 (01/10 0432) Resp:  [10-19] 16 (01/10 0432) BP: (127-166)/(56-83) 130/73 mmHg (01/10 0432) SpO2:  [90 %-98 %] 98 % (01/10 0432) Weight:  [251 lb (113.853 kg)] 251 lb (113.853 kg) (01/09 1430)  Intake/Output from previous day:  Intake/Output Summary (Last 24 hours) at 05/24/13 0814 Last data filed at 05/24/13 0803  Gross per 24 hour  Intake 3903.75 ml  Output   2785 ml  Net 1118.75 ml    Intake/Output this shift: Total I/O In: 360 [P.O.:360] Out: -   Labs:  Recent Labs  05/24/13 0437  HGB 11.8*    Recent Labs  05/24/13 0437  WBC 12.3*  RBC 3.76*  HCT 35.7*  PLT 183    Recent Labs  05/24/13 0437  NA 137  K 4.1  CL 99  CO2 26  BUN 9  CREATININE 0.68  GLUCOSE 155*  CALCIUM 8.4   No results found for this basename: LABPT, INR,  in the last 72 hours  EXAM General - Patient is Alert, Appropriate and Oriented Extremity - Neurologically intact Neurovascular intact No cellulitis present Compartment soft Dressing - dressing C/D/I Motor Function - intact, moving foot and toes well on exam.    Past Medical History  Diagnosis Date  . Hypertension   . Chicken pox as child  . Colon polyps   . Mononucleosis   . Lung nodule seen on imaging study     noted 8/13- needs CT f/u in 1 year  . Pneumonia     hx of  . TIA (transient ischemic attack) 2007    from IV dye, no residual, passed out and was told by RN that it was TIA  . GERD (gastroesophageal reflux disease)     occasional  . Arthritis   . Hemorrhoids     "sometimes"  . Complication of anesthesia     "hard to wake up"  . Rosacea      Assessment/Plan: 1 Day Post-Op Procedure(s) (LRB): LEFT TOTAL KNEE ARTHROPLASTY (Left) Principal Problem:   OA (osteoarthritis) of knee   Advance diet Up with therapy D/C IV fluids Discharge to SNF on 1/12  DVT Prophylaxis - Xarelto Weight-Bearing as tolerated to left leg D/C O2 and Pulse OX and try on Room Air  Davione Lenker V 05/24/2013, 8:14 AM

## 2013-05-24 NOTE — Evaluation (Signed)
Occupational Therapy Evaluation Patient Details Name: KELLAN BOEHLKE MRN: [id number] DOB: 1941-04-13 Today's Date: 05/24/2013 Time: 0277-4128 OT Time Calculation (min): 15 min  OT Assessment / Plan / Recommendation History of present illness TKR   Clinical Impression   Pt presents to OT with decreased I with ADL activity and will benefit from skilled OT to increase I with ADL activity and return to PLOF    OT Assessment  Patient needs continued OT Services    Follow Up Recommendations  SNF       Equipment Recommendations  None recommended by OT       Frequency  Min 2X/week    Precautions / Restrictions Precautions Precautions: Knee Restrictions Weight Bearing Restrictions: No Other Position/Activity Restrictions: WBAT LLE       ADL  Grooming: Set up Where Assessed - Grooming: Unsupported sitting Upper Body Bathing: Set up Where Assessed - Upper Body Bathing: Unsupported sitting Lower Body Bathing: Moderate assistance Where Assessed - Lower Body Bathing: Unsupported sit to stand Upper Body Dressing: Set up Where Assessed - Upper Body Dressing: Unsupported sitting Lower Body Dressing: Moderate assistance Where Assessed - Lower Body Dressing: Supported sit to Lobbyist: Minimal assistance Armed forces technical officer Method: Sit to Loss adjuster, chartered: Comfort height toilet Toileting - Clothing Manipulation and Hygiene: Minimal assistance Where Assessed - Camera operator Manipulation and Hygiene: Standing    OT Diagnosis: Generalized weakness  OT Problem List: Decreased strength OT Treatment Interventions: DME and/or AE instruction;Patient/family education   OT Goals(Current goals can be found in the care plan section) Acute Rehab OT Goals Patient Stated Goal: To get better.  OT Goal Formulation: With patient Time For Goal Achievement: 06/07/13  Visit Information  Last OT Received On: 05/24/13 Assistance Needed: +1 History of Present  Illness: TKR       Prior Functioning     Home Living Family/patient expects to be discharged to:: Skilled nursing facility Living Arrangements: Spouse/significant other Prior Function Level of Independence: Independent with assistive device(s) Comments: was havign to use RW and cane prior to surgery becasue her knee had gotten so bad.  Communication Communication: No difficulties         Vision/Perception Vision - History Patient Visual Report: No change from baseline   Cognition  Cognition Arousal/Alertness: Awake/alert Behavior During Therapy: WFL for tasks assessed/performed Overall Cognitive Status: Within Functional Limits for tasks assessed    Extremity/Trunk Assessment Upper Extremity Assessment Upper Extremity Assessment: Overall WFL for tasks assessed Lower Extremity Assessment Lower Extremity Assessment: LLE deficits/detail LLE Deficits / Details: ROM and quad strength intact grossly 3+/5, and ROM knee flexion grossly 80 degrees in sitting     Mobility Bed Mobility Overal bed mobility: Needs Assistance General bed mobility comments: unable to assess due to pt up in recliner. Bit patient reported nusring helping her LLE a little bit.  Transfers General transfer comment: min a for sit to stand from chair           End of Session OT - End of Session Activity Tolerance: Patient tolerated treatment well Patient left: in chair;with call bell/phone within reach;with family/visitor present  GO     Betsy Pries 05/24/2013, 12:42 PM

## 2013-05-24 NOTE — Progress Notes (Signed)
Clinical Social Work Department CLINICAL SOCIAL WORK PLACEMENT NOTE 05/24/2013  Patient:  [name], [name]  Account Number:  [id number] Admit date:  05/22/2013  Clinical Social Worker:  Levie Heritage  Date/time:  05/24/2013 12:05 PM  Clinical Social Work is seeking post-discharge placement for this patient at the following level of care:   [place]   (*CSW will update this form in Epic as items are completed)   05/24/2013  Patient/family provided with [facility] Department of Clinical Social Work's list of facilities offering this level of care within the geographic area requested by the patient (or if unable, by the patient's family).  05/24/2013  Patient/family informed of their freedom to choose among providers that offer the needed level of care, that participate in Medicare, Medicaid or managed care program needed by the patient, have an available bed and are willing to accept the patient.  05/24/2013  Patient/family informed of [facility]' ownership interest in [facility], as well as of the fact that they are under no obligation to receive care at this facility.  PASARR submitted to EDS on 05/24/2013 PASARR number received from EDS on 05/24/2013  FL2 transmitted to all facilities in geographic area requested by pt/family on  05/24/2013 FL2 transmitted to all facilities within larger geographic area on   Patient informed that his/her managed care company has contracts with or will negotiate with  certain facilities, including the following:     Patient/family informed of bed offers received:   Patient chooses bed at  Physician recommends and patient chooses bed at    Patient to be transferred to  on   Patient to be transferred to facility by   The following physician request were entered in Epic:   Additional Comments:  Bernita Raisin, [place] Work [phone]

## 2013-05-24 NOTE — Evaluation (Signed)
Physical Therapy Evaluation Patient Details Name: Kimberly Ferrell MRN: [id number] DOB: 1940/06/20 Today's Date: 05/24/2013 Time: 7062-3762 PT Time Calculation (min): 25 min  PT Assessment / Plan / Recommendation History of Present Illness  L TKA   Clinical Impression  Pt s/p LTKA presents with decreased mobility and strength and ROM in LLE, to benefit for PT to increase ability to be at modified independence level to return home safely.     PT Assessment  Patient needs continued PT services    Follow Up Recommendations  SNF ([facility])    Does the patient have the potential to tolerate intense rehabilitation      Barriers to Discharge        Equipment Recommendations  None recommended by PT (pt has RW and cane)    Recommendations for Other Services     Frequency 7X/week    Precautions / Restrictions Precautions Precautions: Knee Restrictions Weight Bearing Restrictions: No Other Position/Activity Restrictions: WBAT LLE   Pertinent Vitals/Pain 2/10 in LE knee. Ice applied after session and positioned in straightened position in recliner.      Mobility  Bed Mobility Overal bed mobility: Needs Assistance General bed mobility comments: unable to assess due to pt up in recliner. Bit patient reported nusring helping her LLE a little bit.  Ambulation/Gait Ambulation Distance (Feet): 100 Feet Assistive device: Rolling walker (2 wheeled) Gait Pattern/deviations: Step-to pattern Gait velocity: slow General Gait Details: one slight LOB , but min A to correct. after she got the pattern down she did fine.     Exercises Total Joint Exercises Ankle Circles/Pumps: AROM;Both;10 reps Quad Sets: AROM;Left;10 reps Heel Slides: AAROM;Left;10 reps Knee Flexion: AAROM;Left;5 reps;Seated Goniometric ROM: 5-80   PT Diagnosis: Difficulty walking  PT Problem List: Decreased strength;Decreased range of motion;Decreased activity tolerance;Decreased mobility;Decreased knowledge of use of  DME PT Treatment Interventions: DME instruction;Gait training;Functional mobility training;Therapeutic activities;Therapeutic exercise     PT Goals(Current goals can be found in the care plan section) Acute Rehab PT Goals Patient Stated Goal: To get better.  PT Goal Formulation: With patient Time For Goal Achievement: 06/07/13 Potential to Achieve Goals: Good  Visit Information  Last PT Received On: 05/24/13 Assistance Needed: +1 History of Present Illness: L TKA        Prior Functioning  Home Living Family/patient expects to be discharged to:: Skilled nursing facility Living Arrangements: Spouse/significant other Prior Function Level of Independence: Independent with assistive device(s) Comments: was havign to use RW and cane prior to surgery becasue her knee had gotten so bad.  Communication Communication: No difficulties    Cognition  Cognition Arousal/Alertness: Awake/alert Behavior During Therapy: WFL for tasks assessed/performed Overall Cognitive Status: Within Functional Limits for tasks assessed    Extremity/Trunk Assessment Lower Extremity Assessment Lower Extremity Assessment: LLE deficits/detail LLE Deficits / Details: ROM and quad strength intact grossly 3+/5, and ROM knee flexion grossly 80 degrees in sitting   Balance    End of Session PT - End of Session Equipment Utilized During Treatment: Gait belt Activity Tolerance: Patient tolerated treatment well Patient left: in chair;with call bell/phone within reach Nurse Communication: Mobility status  GP     Clide Dales 05/24/2013, 11:46 AM Clide Dales, PT Pager: [phone] 05/24/2013

## 2013-05-25 LAB — BASIC METABOLIC PANEL
BUN: 13 mg/dL (ref 6–23)
CHLORIDE: 102 meq/L (ref 96–112)
CO2: 30 mEq/L (ref 19–32)
Calcium: 8.8 mg/dL (ref 8.4–10.5)
Creatinine, Ser: 0.79 mg/dL (ref 0.50–1.10)
GFR calc non Af Amer: 81 mL/min — ABNORMAL LOW (ref >90)
Glucose, Bld: 160 mg/dL — ABNORMAL HIGH (ref 70–99)
POTASSIUM: 4.4 meq/L (ref 3.7–5.3)
SODIUM: 140 meq/L (ref 137–147)

## 2013-05-25 LAB — CBC
HCT: 31.6 % — ABNORMAL LOW (ref 36.0–46.0)
Hemoglobin: 10.8 g/dL — ABNORMAL LOW (ref 12.0–15.0)
MCH: 32.2 pg (ref 26.0–34.0)
MCHC: 34.2 g/dL (ref 30.0–36.0)
MCV: 94.3 fL (ref 78.0–100.0)
PLATELETS: 198 10*3/uL (ref 150–400)
RBC: 3.35 MIL/uL — ABNORMAL LOW (ref 3.87–5.11)
RDW: 13.9 % (ref 11.5–15.5)
WBC: 14.2 10*3/uL — AB (ref 4.0–10.5)

## 2013-05-25 NOTE — Progress Notes (Signed)
Physical Therapy Treatment Patient Details Name: Kimberly Ferrell MRN: [id number] DOB: [birth year] Today's Date: 05/25/2013 Time: 5053-9767 PT Time Calculation (min): 23 min  PT Assessment / Plan / Recommendation  History of Present Illness TKR   PT Comments   Pt tolerated session well and doing great.   Follow Up Recommendations  SNF     Does the patient have the potential to tolerate intense rehabilitation     Barriers to Discharge        Equipment Recommendations  None recommended by PT    Recommendations for Other Services    Frequency 7X/week   Progress towards PT Goals Progress towards PT goals: Progressing toward goals  Plan      Precautions / Restrictions     Pertinent Vitals/Pain "slight pain " 2/10 LLE    Mobility  Bed Mobility Overal bed mobility:  (nto tested this morning pt up already in chair) Transfers Overall transfer level: Needs assistance Equipment used: Rolling walker (2 wheeled) Transfers: Sit to/from Stand Sit to Stand: Min assist Ambulation/Gait Ambulation/Gait assistance: Min guard Ambulation Distance (Feet): 110 Feet Assistive device: Rolling walker (2 wheeled) Gait Pattern/deviations: Step-to pattern Gait velocity: slow General Gait Details: steady todaya nd good sequencing, at times step through gait pattern with very little pain.     Exercises Total Joint Exercises Ankle Circles/Pumps: AROM;Both;10 reps Straight Leg Raises: AAROM;Left;10 reps;Seated Long Arc Quad: AAROM;Left;10 reps;Seated Knee Flexion: AAROM;Left;10 reps;Seated Goniometric ROM: 0-90   PT Diagnosis:    PT Problem List:   PT Treatment Interventions:     PT Goals (current goals can now be found in the care plan section) Acute Rehab PT Goals Patient Stated Goal: To get better.  PT Goal Formulation: With patient Time For Goal Achievement: 06/07/13  Visit Information  Last PT Received On: 05/25/13 Assistance Needed: +1 History of Present Illness: TKR     Subjective Data  Patient Stated Goal: To get better.    Cognition       Balance     End of Session PT - End of Session Equipment Utilized During Treatment: Gait belt Activity Tolerance: Patient tolerated treatment well Patient left: in chair;with call bell/phone within reach Nurse Communication: Mobility status   GP     Clide Dales 05/25/2013, 11:16 AM Clide Dales, PT Pager: [phone] 05/25/2013

## 2013-05-25 NOTE — Progress Notes (Signed)
Subjective: 2 Days Post-Op Procedure(s) (LRB): LEFT TOTAL KNEE ARTHROPLASTY (Left) Patient reports pain as well controlled. No complaints this am.  Tolerating current treatment. Doing well with PT. Passing flatus, no BM. Tolerating PO's well. No N/V/C. Denies Cp, SOB, or calf pain.  Objective: Vital signs in last 24 hours: Temp:  [98.3 F (36.8 C)-98.8 F (37.1 C)] 98.7 F (37.1 C) (01/11 0455) Pulse Rate:  [62-65] 65 (01/11 0455) Resp:  [16-18] 16 (01/11 0455) BP: (146-160)/(74-88) 146/74 mmHg (01/11 0455) SpO2:  [92 %-98 %] 92 % (01/11 0455)  Intake/Output from previous day: 01/10 0701 - 01/11 0700 In: 1431.3 [P.O.:840; I.V.:591.3] Out: 1750 [Urine:1750] Intake/Output this shift: Total I/O In: 240 [P.O.:240] Out: -    Recent Labs  05/24/13 0437 05/25/13 0448  HGB 11.8* 10.8*    Recent Labs  05/24/13 0437 05/25/13 0448  WBC 12.3* 14.2*  RBC 3.76* 3.35*  HCT 35.7* 31.6*  PLT 183 198    Recent Labs  05/24/13 0437 05/25/13 0448  NA 137 140  K 4.1 4.4  CL 99 102  CO2 26 30  BUN 9 13  CREATININE 0.68 0.79  GLUCOSE 155* 160*  CALCIUM 8.4 8.8   No results found for this basename: LABPT, INR,  in the last 72 hours  Alert and oriented x3. RRR, Lungs clear, BS x4. Left Calf soft and non tender. L knee dressing C/D/I. No DVT signs. No signs of infection or compartment syndrome. LLE neurovascularly intact.   Assessment/Plan: 2 Days Post-Op Procedure(s) (LRB): LEFT TOTAL KNEE ARTHROPLASTY (Left) Up with PT Continue current care Dressing changed Plan D/C SNF Monday  Mang Hazelrigg L 05/25/2013, 8:20 AM

## 2013-05-26 ENCOUNTER — Encounter (HOSPITAL_COMMUNITY): Payer: Self-pay | Admitting: Orthopedic Surgery

## 2013-05-26 LAB — CBC
HEMATOCRIT: 30.8 % — AB (ref 36.0–46.0)
Hemoglobin: 10.1 g/dL — ABNORMAL LOW (ref 12.0–15.0)
MCH: 31.3 pg (ref 26.0–34.0)
MCHC: 32.8 g/dL (ref 30.0–36.0)
MCV: 95.4 fL (ref 78.0–100.0)
Platelets: 163 10*3/uL (ref 150–400)
RBC: 3.23 MIL/uL — AB (ref 3.87–5.11)
RDW: 14.2 % (ref 11.5–15.5)
WBC: 12.2 10*3/uL — ABNORMAL HIGH (ref 4.0–10.5)

## 2013-05-26 MED ORDER — METOCLOPRAMIDE HCL 5 MG PO TABS: 5.0000 mg | ORAL_TABLET | Freq: Three times a day (TID) | ORAL | Status: DC | PRN

## 2013-05-26 MED ORDER — RIVAROXABAN 10 MG PO TABS: 10.0000 mg | ORAL_TABLET | Freq: Every day | ORAL | Status: DC

## 2013-05-26 MED ORDER — POLYETHYLENE GLYCOL 3350 17 G PO PACK: 17.0000 g | PACK | Freq: Every day | ORAL | Status: DC | PRN

## 2013-05-26 MED ORDER — ONDANSETRON HCL 4 MG PO TABS: 4.0000 mg | ORAL_TABLET | Freq: Four times a day (QID) | ORAL | Status: DC | PRN

## 2013-05-26 MED ORDER — METHOCARBAMOL 500 MG PO TABS: 500.0000 mg | ORAL_TABLET | Freq: Four times a day (QID) | ORAL | Status: DC | PRN

## 2013-05-26 MED ORDER — DSS 100 MG PO CAPS: 100.0000 mg | ORAL_CAPSULE | Freq: Two times a day (BID) | ORAL | Status: DC

## 2013-05-26 MED ORDER — TRAMADOL HCL 50 MG PO TABS: 50.0000 mg | ORAL_TABLET | Freq: Four times a day (QID) | ORAL | Status: DC | PRN

## 2013-05-26 MED ORDER — OXYCODONE HCL 5 MG PO TABS: 5.0000 mg | ORAL_TABLET | ORAL | Status: DC | PRN

## 2013-05-26 MED ORDER — ACETAMINOPHEN 325 MG PO TABS: 650.0000 mg | ORAL_TABLET | Freq: Four times a day (QID) | ORAL | Status: DC | PRN

## 2013-05-26 MED ORDER — BISACODYL 10 MG RE SUPP: 10.0000 mg | Freq: Every day | RECTAL | Status: DC | PRN

## 2013-05-26 NOTE — Discharge Summary (Signed)
Physician Discharge Summary   Patient ID: Kimberly Ferrell MRN: [id number] DOB/AGE: [birth year] 73 y.o.  Admit date: 05/23/2013 Discharge date: 05-26-2013  Primary Diagnosis:  Osteoarthritis Left knee(s)  Admission Diagnoses:  Past Medical History  Diagnosis Date  . Hypertension   . Chicken pox as child  . Colon polyps   . Mononucleosis   . Lung nodule seen on imaging study     noted 8/13- needs CT f/u in 1 year  . Pneumonia     hx of  . TIA (transient ischemic attack) 2007    from IV dye, no residual, passed out and was told by RN that it was TIA  . GERD (gastroesophageal reflux disease)     occasional  . Arthritis   . Hemorrhoids     "sometimes"  . Complication of anesthesia     "hard to wake up"  . Rosacea    Discharge Diagnoses:   Principal Problem:   OA (osteoarthritis) of knee Active Problems:   Postoperative anemia due to acute blood loss  Estimated body mass index is 40.53 kg/(m^2) as calculated from the following:   Height as of this encounter: 5' 6" (1.676 m).   Weight as of this encounter: 113.853 kg (251 lb).  Procedure:  Procedure(s) (LRB): LEFT TOTAL KNEE ARTHROPLASTY (Left)   Consults: None  HPI: Kimberly Ferrell is a 73 y.o. year old female with end stage OA of her left knee with progressively worsening pain and dysfunction. She has constant pain, with activity and at rest and significant functional deficits with difficulties even with ADLs. She has had extensive non-op management including analgesics, injections of cortisone and viscosupplements, and home exercise program, but remains in significant pain with significant dysfunction. Radiographs show bone on bone arthritis medial and patellofemoral. She presents now for left Total Knee Arthroplasty.   Laboratory Data: Admission on 05/23/2013  Component Date Value Range Status  . ABO/RH(D) 05/23/2013 O POS   Final  . Antibody Screen 05/23/2013 NEG   Final  . Sample Expiration 05/23/2013 05/26/2013    Final  . ABO/RH(D) 05/23/2013 O POS   Final  . WBC 05/24/2013 12.3* 4.0 - 10.5 K/uL Final  . RBC 05/24/2013 3.76* 3.87 - 5.11 MIL/uL Final  . Hemoglobin 05/24/2013 11.8* 12.0 - 15.0 g/dL Final  . HCT 05/24/2013 35.7* 36.0 - 46.0 % Final  . MCV 05/24/2013 94.9  78.0 - 100.0 fL Final  . MCH 05/24/2013 31.4  26.0 - 34.0 pg Final  . MCHC 05/24/2013 33.1  30.0 - 36.0 g/dL Final  . RDW 05/24/2013 13.6  11.5 - 15.5 % Final  . Platelets 05/24/2013 183  150 - 400 K/uL Final  . Sodium 05/24/2013 137  137 - 147 mEq/L Final  . Potassium 05/24/2013 4.1  3.7 - 5.3 mEq/L Final  . Chloride 05/24/2013 99  96 - 112 mEq/L Final  . CO2 05/24/2013 26  19 - 32 mEq/L Final  . Glucose, Bld 05/24/2013 155* 70 - 99 mg/dL Final  . BUN 05/24/2013 9  6 - 23 mg/dL Final  . Creatinine, Ser 05/24/2013 0.68  0.50 - 1.10 mg/dL Final  . Calcium 05/24/2013 8.4  8.4 - 10.5 mg/dL Final  . GFR calc non Af Amer 05/24/2013 85* >90 mL/min Final  . GFR calc Af Amer 05/24/2013 >90  >90 mL/min Final   Comment: (NOTE)  The eGFR has been calculated using the CKD EPI equation.                          This calculation has not been validated in all clinical situations.                          eGFR's persistently <90 mL/min signify possible Chronic Kidney                          Disease.  . WBC 05/25/2013 14.2* 4.0 - 10.5 K/uL Final  . RBC 05/25/2013 3.35* 3.87 - 5.11 MIL/uL Final  . Hemoglobin 05/25/2013 10.8* 12.0 - 15.0 g/dL Final  . HCT 05/25/2013 31.6* 36.0 - 46.0 % Final  . MCV 05/25/2013 94.3  78.0 - 100.0 fL Final  . MCH 05/25/2013 32.2  26.0 - 34.0 pg Final  . MCHC 05/25/2013 34.2  30.0 - 36.0 g/dL Final  . RDW 05/25/2013 13.9  11.5 - 15.5 % Final  . Platelets 05/25/2013 198  150 - 400 K/uL Final  . Sodium 05/25/2013 140  137 - 147 mEq/L Final  . Potassium 05/25/2013 4.4  3.7 - 5.3 mEq/L Final  . Chloride 05/25/2013 102  96 - 112 mEq/L Final  . CO2 05/25/2013 30  19 - 32 mEq/L Final  .  Glucose, Bld 05/25/2013 160* 70 - 99 mg/dL Final  . BUN 05/25/2013 13  6 - 23 mg/dL Final  . Creatinine, Ser 05/25/2013 0.79  0.50 - 1.10 mg/dL Final  . Calcium 05/25/2013 8.8  8.4 - 10.5 mg/dL Final  . GFR calc non Af Amer 05/25/2013 81* >90 mL/min Final  . GFR calc Af Amer 05/25/2013 >90  >90 mL/min Final   Comment: (NOTE)                          The eGFR has been calculated using the CKD EPI equation.                          This calculation has not been validated in all clinical situations.                          eGFR's persistently <90 mL/min signify possible Chronic Kidney                          Disease.  . WBC 05/26/2013 12.2* 4.0 - 10.5 K/uL Final  . RBC 05/26/2013 3.23* 3.87 - 5.11 MIL/uL Final  . Hemoglobin 05/26/2013 10.1* 12.0 - 15.0 g/dL Final  . HCT 05/26/2013 30.8* 36.0 - 46.0 % Final  . MCV 05/26/2013 95.4  78.0 - 100.0 fL Final  . MCH 05/26/2013 31.3  26.0 - 34.0 pg Final  . MCHC 05/26/2013 32.8  30.0 - 36.0 g/dL Final  . RDW 05/26/2013 14.2  11.5 - 15.5 % Final  . Platelets 05/26/2013 163  150 - 400 K/uL Final  Hospital Outpatient Visit on 05/14/2013  Component Date Value Range Status  . MRSA, PCR 05/14/2013 NEGATIVE  NEGATIVE Final  . Staphylococcus aureus 05/14/2013 NEGATIVE  NEGATIVE Final   Comment:  The Xpert SA Assay (FDA                          approved for NASAL specimens                          in patients over 59 years of age),                          is one component of                          a comprehensive surveillance                          program.  Test performance has                          been validated by [employer] for patients greater                          than or equal to 39 year old.                          It is not intended                          to diagnose infection nor to                          guide or monitor treatment.  Marland Kitchen aPTT 05/14/2013 33  24 - 37  seconds Final  . WBC 05/14/2013 7.8  4.0 - 10.5 K/uL Final  . RBC 05/14/2013 4.55  3.87 - 5.11 MIL/uL Final  . Hemoglobin 05/14/2013 14.6  12.0 - 15.0 g/dL Final  . HCT 05/14/2013 43.2  36.0 - 46.0 % Final  . MCV 05/14/2013 94.9  78.0 - 100.0 fL Final  . MCH 05/14/2013 32.1  26.0 - 34.0 pg Final  . MCHC 05/14/2013 33.8  30.0 - 36.0 g/dL Final  . RDW 05/14/2013 13.8  11.5 - 15.5 % Final  . Platelets 05/14/2013 176  150 - 400 K/uL Final  . Sodium 05/14/2013 141  137 - 147 mEq/L Final   Please note change in reference range.  . Potassium 05/14/2013 4.5  3.7 - 5.3 mEq/L Final   Please note change in reference range.  . Chloride 05/14/2013 103  96 - 112 mEq/L Final  . CO2 05/14/2013 28  19 - 32 mEq/L Final  . Glucose, Bld 05/14/2013 109* 70 - 99 mg/dL Final  . BUN 05/14/2013 7  6 - 23 mg/dL Final  . Creatinine, Ser 05/14/2013 0.67  0.50 - 1.10 mg/dL Final  . Calcium 05/14/2013 9.9  8.4 - 10.5 mg/dL Final  . Total Protein 05/14/2013 7.5  6.0 - 8.3 g/dL Final  . Albumin 05/14/2013 3.9  3.5 - 5.2 g/dL Final  . AST 05/14/2013 47* 0 - 37 U/L Final  . ALT 05/14/2013 46* 0 - 35 U/L Final  . Alkaline Phosphatase 05/14/2013 98  39 - 117  U/L Final  . Total Bilirubin 05/14/2013 0.5  0.3 - 1.2 mg/dL Final  . GFR calc non Af Amer 05/14/2013 86* >90 mL/min Final  . GFR calc Af Amer 05/14/2013 >90  >90 mL/min Final   Comment: (NOTE)                          The eGFR has been calculated using the CKD EPI equation.                          This calculation has not been validated in all clinical situations.                          eGFR's persistently <90 mL/min signify possible Chronic Kidney                          Disease.  Marland Kitchen Prothrombin Time 05/14/2013 12.2  11.6 - 15.2 seconds Final  . INR 05/14/2013 0.92  0.00 - 1.49 Final  . Color, Urine 05/14/2013 YELLOW  YELLOW Final  . APPearance 05/14/2013 CLEAR  CLEAR Final  . Specific Gravity, Urine 05/14/2013 1.005  1.005 - 1.030 Final  . pH 05/14/2013  7.0  5.0 - 8.0 Final  . Glucose, UA 05/14/2013 NEGATIVE  NEGATIVE mg/dL Final  . Hgb urine dipstick 05/14/2013 NEGATIVE  NEGATIVE Final  . Bilirubin Urine 05/14/2013 NEGATIVE  NEGATIVE Final  . Ketones, ur 05/14/2013 NEGATIVE  NEGATIVE mg/dL Final  . Protein, ur 05/14/2013 NEGATIVE  NEGATIVE mg/dL Final  . Urobilinogen, UA 05/14/2013 0.2  0.0 - 1.0 mg/dL Final  . Nitrite 05/14/2013 NEGATIVE  NEGATIVE Final  . Leukocytes, UA 05/14/2013 NEGATIVE  NEGATIVE Final   MICROSCOPIC NOT DONE ON URINES WITH NEGATIVE PROTEIN, BLOOD, LEUKOCYTES, NITRITE, OR GLUCOSE <1000 mg/dL.  Appointment on 05/05/2013  Component Date Value Range Status  . Total Bilirubin 05/05/2013 1.0  0.3 - 1.2 mg/dL Final  . Bilirubin, Direct 05/05/2013 0.2  0.0 - 0.3 mg/dL Final  . Alkaline Phosphatase 05/05/2013 74  39 - 117 U/L Final  . AST 05/05/2013 48* 0 - 37 U/L Final  . ALT 05/05/2013 48* 0 - 35 U/L Final  . Total Protein 05/05/2013 7.7  6.0 - 8.3 g/dL Final  . Albumin 05/05/2013 4.3  3.5 - 5.2 g/dL Final  Office Visit on 04/16/2013  Component Date Value Range Status  . WBC 04/16/2013 6.7  4.5 - 10.5 K/uL Final  . RBC 04/16/2013 4.44  3.87 - 5.11 Mil/uL Final  . Hemoglobin 04/16/2013 14.1  12.0 - 15.0 g/dL Final  . HCT 04/16/2013 41.6  36.0 - 46.0 % Final  . MCV 04/16/2013 93.7  78.0 - 100.0 fl Final  . MCHC 04/16/2013 33.9  30.0 - 36.0 g/dL Final  . RDW 04/16/2013 13.6  11.5 - 14.6 % Final  . Platelets 04/16/2013 150.0  150.0 - 400.0 K/uL Final  . Neutrophils Relative % 04/16/2013 44.6  43.0 - 77.0 % Final  . Lymphocytes Relative 04/16/2013 38.5  12.0 - 46.0 % Final  . Monocytes Relative 04/16/2013 11.0  3.0 - 12.0 % Final  . Eosinophils Relative 04/16/2013 4.8  0.0 - 5.0 % Final  . Basophils Relative 04/16/2013 1.1  0.0 - 3.0 % Final  . Neutro Abs 04/16/2013 3.0  1.4 - 7.7 K/uL Final  . Lymphs Abs 04/16/2013 2.6  0.7 - 4.0 K/uL  Final  . Monocytes Absolute 04/16/2013 0.7  0.1 - 1.0 K/uL Final  . Eosinophils  Absolute 04/16/2013 0.3  0.0 - 0.7 K/uL Final  . Basophils Absolute 04/16/2013 0.1  0.0 - 0.1 K/uL Final  . Sodium 04/16/2013 140  135 - 145 mEq/L Final  . Potassium 04/16/2013 3.7  3.5 - 5.1 mEq/L Final  . Chloride 04/16/2013 105  96 - 112 mEq/L Final  . CO2 04/16/2013 28  19 - 32 mEq/L Final  . Glucose, Bld 04/16/2013 100* 70 - 99 mg/dL Final  . BUN 04/16/2013 9  6 - 23 mg/dL Final  . Creatinine, Ser 04/16/2013 0.7  0.4 - 1.2 mg/dL Final  . Calcium 04/16/2013 9.1  8.4 - 10.5 mg/dL Final  . GFR 04/16/2013 81.93  >60.00 mL/min Final  . Total Bilirubin 04/16/2013 0.8  0.3 - 1.2 mg/dL Final  . Bilirubin, Direct 04/16/2013 0.1  0.0 - 0.3 mg/dL Final  . Alkaline Phosphatase 04/16/2013 75  39 - 117 U/L Final  . AST 04/16/2013 60* 0 - 37 U/L Final  . ALT 04/16/2013 57* 0 - 35 U/L Final  . Total Protein 04/16/2013 7.3  6.0 - 8.3 g/dL Final  . Albumin 04/16/2013 3.9  3.5 - 5.2 g/dL Final  . TSH 04/16/2013 1.33  0.35 - 5.50 uIU/mL Final  . Cholesterol 04/16/2013 136  0 - 200 mg/dL Final   ATP III Classification       Desirable:  < 200 mg/dL               Borderline High:  200 - 239 mg/dL          High:  > = 240 mg/dL  . Triglycerides 04/16/2013 116.0  0.0 - 149.0 mg/dL Final   Normal:  <150 mg/dLBorderline High:  150 - 199 mg/dL  . HDL 04/16/2013 33.30* >39.00 mg/dL Final  . VLDL 04/16/2013 23.2  0.0 - 40.0 mg/dL Final  . LDL Cholesterol 04/16/2013 80  0 - 99 mg/dL Final  . Total CHOL/HDL Ratio 04/16/2013 4   Final                  Men          Women1/2 Average Risk     3.4          3.3Average Risk          5.0          4.42X Average Risk          9.6          7.13X Average Risk          15.0          11.0                      . Color, UA 04/16/2013 yellow   Final  . Clarity, UA 04/16/2013 clear   Final  . Glucose, UA 04/16/2013 Neg   Final  . Bilirubin, UA 04/16/2013 Neg   Final  . Ketones, UA 04/16/2013 Neg   Final  . Spec Grav, UA 04/16/2013 1.015   Final  . Blood, UA 04/16/2013 Trace    Final  . pH, UA 04/16/2013 6.0   Final  . Protein, UA 04/16/2013 Neg   Final  . Urobilinogen, UA 04/16/2013 0.2   Final  . Nitrite, UA 04/16/2013 Neg   Final  . Leukocytes, UA 04/16/2013 Negative   Final  . Colony Count 04/16/2013 NO GROWTH  Final  . Organism ID, Bacteria 04/16/2013 NO GROWTH   Final     X-Rays:No results found.  EKG: Orders placed in visit on 04/16/13  . EKG 12-LEAD     Hospital Course: Kimberly Ferrell is a 73 y.o. who was admitted to [facility]. They were brought to the operating room on 05/23/2013 and underwent Procedure(s): LEFT TOTAL KNEE ARTHROPLASTY.  Patient tolerated the procedure well and was later transferred to the recovery room and then to the orthopaedic floor for postoperative care.  They were given PO and IV analgesics for pain control following their surgery.  They were given 24 hours of postoperative antibiotics of  Anti-infectives   Start     Dose/Rate Route Frequency Ordered Stop   05/23/13 1800  ceFAZolin (ANCEF) IVPB 2 g/50 mL premix     2 g 100 mL/hr over 30 Minutes Intravenous Every 6 hours 05/23/13 1438 05/23/13 2344   05/23/13 0900  ceFAZolin (ANCEF) IVPB 2 g/50 mL premix     2 g 100 mL/hr over 30 Minutes Intravenous On call to O.R. 05/23/13 0626 05/23/13 1135     and started on DVT prophylaxis in the form of Xarelto.   PT and OT were ordered for total joint protocol.  Discharge planning consulted to help with postop disposition and equipment needs.  Patient had a rough night on the evening of surgery of moderate pain.  They started to get up OOB with therapy on day one. Hemovac drain was pulled without difficulty.  Continued to work with therapy into day two.  Dressing was changed on day two and the incision was healing well.  By day three, the patient had progressed with therapy and meeting their goals.  Incision was healing well.  Patient was seen in rounds and was ready to go to the SNF - [facility].   Discharge  Medications: Prior to Admission medications   Medication Sig Start Date End Date Taking? Authorizing Provider  azithromycin (ZITHROMAX) 250 MG tablet Take 250 mg by mouth. Take on Monday Wednesday and fridays 04/16/13  Yes Midge Minium, MD  benazepril-hydrochlorthiazide (LOTENSIN HCT) 10-12.5 MG per tablet Take 1 tablet by mouth daily with breakfast. 04/16/13  Yes Midge Minium, MD  acetaminophen (TYLENOL) 325 MG tablet Take 2 tablets (650 mg total) by mouth every 6 (six) hours as needed for mild pain (or Fever >/= 101). 05/26/13    Dara Lords, PA-C  bisacodyl (DULCOLAX) 10 MG suppository Place 1 suppository (10 mg total) rectally daily as needed for moderate constipation. 05/26/13    Dara Lords, PA-C  bisoprolol-hydrochlorothiazide (ZIAC) 2.5-6.25 MG per tablet Take 1 tablet by mouth daily. 05/16/13   Midge Minium, MD  docusate sodium 100 MG CAPS Take 100 mg by mouth 2 (two) times daily. 05/26/13    Dara Lords, PA-C  methocarbamol (ROBAXIN) 500 MG tablet Take 1 tablet (500 mg total) by mouth every 6 (six) hours as needed for muscle spasms. 05/26/13    Dara Lords, PA-C  metoCLOPramide (REGLAN) 5 MG tablet Take 1-2 tablets (5-10 mg total) by mouth every 8 (eight) hours as needed for nausea (if ondansetron (ZOFRAN) ineffective.). 05/26/13    , PA-C  ondansetron (ZOFRAN) 4 MG tablet Take 1 tablet (4 mg total) by mouth every 6 (six) hours as needed for nausea. 05/26/13    , PA-C  oxyCODONE (OXY IR/ROXICODONE) 5 MG immediate release tablet Take 1-2 tablets (5-10 mg total) by mouth every 3 (three) hours as needed for breakthrough  pain. 05/26/13   Micalah Cabezas Dara Lords, PA-C  polyethylene glycol (MIRALAX / GLYCOLAX) packet Take 17 g by mouth daily as needed for mild constipation. 05/26/13   Alonnah Lampkins Dara Lords, PA-C  rivaroxaban (XARELTO) 10 MG TABS tablet Take 1 tablet (10 mg total) by mouth daily with breakfast. Take Xarelto for two and  a half more weeks, then discontinue Xarelto. Once the patient has completed the blood thinner regimen, then take a Baby 81 mg Aspirin daily for four more weeks. 05/26/13   Ulla Mckiernan, PA-C  traMADol (ULTRAM) 50 MG tablet Take 1-2 tablets (50-100 mg total) by mouth every 6 (six) hours as needed (mild to moderate pain). 05/26/13   Nevah Dalal Dara Lords, PA-C   Discharge to SNF  Diet - Cardiac diet  Follow up - in 2 weeks  Activity - WBAT  Disposition - Skilled nursing facility - [facility] [place] [facility] Dis[facility] - Good  D/C Meds - See DC Summary  DVT Prophylaxis - Xarelto  Discharge Orders   Future Orders Complete By Expires   Call MD / Call 911  As directed    Comments:     If you experience chest pain or shortness of breath, CALL 911 and be transported to the hospital emergency room.  If you develope a fever above 101 F, pus (white drainage) or increased drainage or redness at the wound, or calf pain, call your surgeon's office.   Change dressing  As directed    Comments:     Change dressing daily with sterile 4 x 4 inch gauze dressing and apply TED hose. Do not submerge the incision under water.   Constipation Prevention  As directed    Comments:     Drink plenty of fluids.  Prune juice may be helpful.  You may use a stool softener, such as Colace (over the counter) 100 mg twice a day.  Use MiraLax (over the counter) for constipation as needed.   Diet - low sodium heart healthy  As directed    Discharge instructions  As directed    Comments:     Pick up stool softner and laxative for home. Do not submerge incision under water. May shower. Continue to use ice for pain and swelling from surgery.  Take Xarelto for two and a half more weeks, then discontinue Xarelto. Once the patient has completed the blood thinner regimen, then take a Baby 81 mg Aspirin daily for four more weeks.   Do not put a pillow under the knee. Place it under the heel.  As directed    Do  not sit on low chairs, stoools or toilet seats, as it may be difficult to get up from low surfaces  As directed    Driving restrictions  As directed    Comments:     No driving until released by the physician.   Increase activity slowly as tolerated  As directed    Lifting restrictions  As directed    Comments:     No lifting until released by the physician.   Patient may shower  As directed    Comments:     You may shower without a dressing once there is no drainage.  Do not wash over the wound.  If drainage remains, do not shower until drainage stops.   TED hose  As directed    Comments:     Use stockings (TED hose) for 3 weeks on both leg(s).  You may remove them at night for sleeping.  Weight bearing as tolerated  As directed    Questions:     Laterality:     Extremity:         Medication List         acetaminophen 325 MG tablet  Commonly known as:  TYLENOL  Take 2 tablets (650 mg total) by mouth every 6 (six) hours as needed for mild pain (or Fever >/= 101).     azithromycin 250 MG tablet  Commonly known as:  ZITHROMAX  Take 250 mg by mouth. Take on Monday Wednesday and fridays     benazepril-hydrochlorthiazide 10-12.5 MG per tablet  Commonly known as:  LOTENSIN HCT  Take 1 tablet by mouth daily with breakfast.     bisacodyl 10 MG suppository  Commonly known as:  DULCOLAX  Place 1 suppository (10 mg total) rectally daily as needed for moderate constipation.     bisoprolol-hydrochlorothiazide 2.5-6.25 MG per tablet  Commonly known as:  ZIAC  Take 1 tablet by mouth daily.     DSS 100 MG Caps  Take 100 mg by mouth 2 (two) times daily.     methocarbamol 500 MG tablet  Commonly known as:  ROBAXIN  Take 1 tablet (500 mg total) by mouth every 6 (six) hours as needed for muscle spasms.     metoCLOPramide 5 MG tablet  Commonly known as:  REGLAN  Take 1-2 tablets (5-10 mg total) by mouth every 8 (eight) hours as needed for nausea (if ondansetron (ZOFRAN)  ineffective.).     ondansetron 4 MG tablet  Commonly known as:  ZOFRAN  Take 1 tablet (4 mg total) by mouth every 6 (six) hours as needed for nausea.     oxyCODONE 5 MG immediate release tablet  Commonly known as:  Oxy IR/ROXICODONE  Take 1-2 tablets (5-10 mg total) by mouth every 3 (three) hours as needed for breakthrough pain.     polyethylene glycol packet  Commonly known as:  MIRALAX / GLYCOLAX  Take 17 g by mouth daily as needed for mild constipation.     rivaroxaban 10 MG Tabs tablet  Commonly known as:  XARELTO  - Take 1 tablet (10 mg total) by mouth daily with breakfast. Take Xarelto for two and a half more weeks, then discontinue Xarelto.  - Once the patient has completed the blood thinner regimen, then take a Baby 81 mg Aspirin daily for four more weeks.     traMADol 50 MG tablet  Commonly known as:  ULTRAM  Take 1-2 tablets (50-100 mg total) by mouth every 6 (six) hours as needed (mild to moderate pain).           Follow-up Information   Follow up with Gearlean Alf, MD. Schedule an appointment as soon as possible for a visit in 2 weeks.   Specialty:  Orthopedic Surgery   Contact information:   [address] [place] [address] [phone]       Signed: Mickel Crow 05/26/2013, 7:20 AM

## 2013-05-26 NOTE — Progress Notes (Signed)
Pt d/c to [facility]. No DME needs at time of d/c. Pt in no pain and with no acute needs.

## 2013-05-26 NOTE — Discharge Instructions (Signed)
° °Dr. Frank Aluisio °Total Joint Specialist °[facility] °[address] °[place], [place] [address] °[phone] ° °TOTAL KNEE REPLACEMENT POSTOPERATIVE DIRECTIONS ° ° ° °Knee Rehabilitation, Guidelines Following Surgery  °Results after knee surgery are often greatly improved when you follow the exercise, range of motion and muscle strengthening exercises prescribed by your doctor. Safety measures are also important to protect the knee from further injury. Any time any of these exercises cause you to have increased pain or swelling in your knee joint, decrease the amount until you are comfortable again and slowly increase them. If you have problems or questions, call your caregiver or physical therapist for advice.  ° °HOME CARE INSTRUCTIONS  °Remove items at home which could result in a fall. This includes throw rugs or furniture in walking pathways.  °Continue medications as instructed at time of discharge. °You may have some home medications which will be placed on hold until you complete the course of blood thinner medication.  °You may start showering once you are discharged home but do not submerge the incision under water. Just pat the incision dry and apply a dry gauze dressing on daily. °Walk with walker as instructed.  °You may resume a sexual relationship in one month or when given the OK by  your doctor.  °· Use walker as long as suggested by your caregivers. °· Avoid periods of inactivity such as sitting longer than an hour when not asleep. This helps prevent blood clots.  °You may put full weight on your legs and walk as much as is comfortable.  °You may return to work once you are cleared by your doctor.  °Do not drive a car for 6 weeks or until released by you surgeon.  °· Do not drive while taking narcotics.  °Wear the elastic stockings for three weeks following surgery during the day but you may remove then at night. °Make sure you keep all of your appointments after your  operation with all of your doctors and caregivers. You should call the office at the above phone number and make an appointment for approximately two weeks after the date of your surgery. °Change the dressing daily and reapply a dry dressing each time. °Please pick up a stool softener and laxative for home use as long as you are requiring pain medications. °· Continue to use ice on the knee for pain and swelling from surgery. You may notice swelling that will progress down to the foot and ankle.  This is normal after surgery.  Elevate the leg when you are not up walking on it.   °It is important for you to complete the blood thinner medication as prescribed by your doctor. °· Continue to use the breathing machine which will help keep your temperature down.  It is common for your temperature to cycle up and down following surgery, especially at night when you are not up moving around and exerting yourself.  The breathing machine keeps your lungs expanded and your temperature down. ° °RANGE OF MOTION AND STRENGTHENING EXERCISES  °Rehabilitation of the knee is important following a knee injury or an operation. After just a few days of immobilization, the muscles of the thigh which control the knee become weakened and shrink (atrophy). Knee exercises are designed to build up the tone and strength of the thigh muscles and to improve knee motion. Often times heat used for twenty to thirty minutes before working out will loosen up your tissues and help with improving the   range of motion but do not use heat for the first two weeks following surgery. These exercises can be done on a training (exercise) mat, on the floor, on a table or on a bed. Use what ever works the best and is most comfortable for you Knee exercises include:  °Leg Lifts - While your knee is still immobilized in a splint or cast, you can do straight leg raises. Lift the leg to 60 degrees, hold for 3 sec, and slowly lower the leg. Repeat 10-20 times 2-3  times daily. Perform this exercise against resistance later as your knee gets better.  °Quad and Hamstring Sets - Tighten up the muscle on the front of the thigh (Quad) and hold for 5-10 sec. Repeat this 10-20 times hourly. Hamstring sets are done by pushing the foot backward against an object and holding for 5-10 sec. Repeat as with quad sets.  °A rehabilitation program following serious knee injuries can speed recovery and prevent re-injury in the future due to weakened muscles. Contact your doctor or a physical therapist for more information on knee rehabilitation.  ° °SKILLED REHAB INSTRUCTIONS: °If the patient is transferred to a skilled rehab facility following release from the hospital, a list of the current medications will be sent to the facility for the patient to continue.  When discharged from the skilled rehab facility, please have the facility set up the patient's Home Health Physical Therapy prior to being released. Also, the skilled facility will be responsible for providing the patient with their medications at time of release from the facility to include their pain medication, the muscle relaxants, and their blood thinner medication. If the patient is still at the rehab facility at time of the two week follow up appointment, the skilled rehab facility will also need to assist the patient in arranging follow up appointment in our office and any transportation needs. ° °MAKE SURE YOU:  °Understand these instructions.  °Will watch your condition.  °Will get help right away if you are not doing well or get worse.  ° ° °Pick up stool softner and laxative for home. °Do not submerge incision under water. °May shower. °Continue to use ice for pain and swelling from surgery. ° °Take Xarelto for two and a half more weeks, then discontinue Xarelto. °Once the patient has completed the blood thinner regimen, then take a Baby 81 mg Aspirin daily for four more weeks. ° ° ° ° ° ° ° ° ° °

## 2013-05-26 NOTE — Progress Notes (Signed)
   Subjective: 3 Days Post-Op Procedure(s) (LRB): LEFT TOTAL KNEE ARTHROPLASTY (Left) Patient reports pain as mild.   Patient seen in rounds with Dr. Wynelle Link. Patient is well, and has had no acute complaints or problems Patient is ready to go to the SNF  Objective: Vital signs in last 24 hours: Temp:  [98 F (36.7 C)-98.6 F (37 C)] 98.2 F (36.8 C) (01/12 0525) Pulse Rate:  [65-75] 65 (01/12 0525) Resp:  [18-20] 20 (01/12 0525) BP: (127-159)/(74-81) 127/76 mmHg (01/12 0525) SpO2:  [93 %-94 %] 93 % (01/12 0525)  Intake/Output from previous day:  Intake/Output Summary (Last 24 hours) at 05/26/13 0714 Last data filed at 05/26/13 0525  Gross per 24 hour  Intake    840 ml  Output   1600 ml  Net   -760 ml    Intake/Output this shift:    Labs:  Recent Labs  05/24/13 0437 05/25/13 0448 05/26/13 0501  HGB 11.8* 10.8* 10.1*    Recent Labs  05/25/13 0448 05/26/13 0501  WBC 14.2* 12.2*  RBC 3.35* 3.23*  HCT 31.6* 30.8*  PLT 198 163    Recent Labs  05/24/13 0437 05/25/13 0448  NA 137 140  K 4.1 4.4  CL 99 102  CO2 26 30  BUN 9 13  CREATININE 0.68 0.79  GLUCOSE 155* 160*  CALCIUM 8.4 8.8   No results found for this basename: LABPT, INR,  in the last 72 hours  EXAM: General - Patient is Alert and Appropriate Extremity - Neurovascular intact Sensation intact distally Dorsiflexion/Plantar flexion intact Incision - clean, dry Motor Function - intact, moving foot and toes well on exam.   Assessment/Plan: 3 Days Post-Op Procedure(s) (LRB): LEFT TOTAL KNEE ARTHROPLASTY (Left) Procedure(s) (LRB): LEFT TOTAL KNEE ARTHROPLASTY (Left) Past Medical History  Diagnosis Date  . Hypertension   . Chicken pox as child  . Colon polyps   . Mononucleosis   . Lung nodule seen on imaging study     noted 8/13- needs CT f/u in 1 year  . Pneumonia     hx of  . TIA (transient ischemic attack) 2007    from IV dye, no residual, passed out and was told by RN that it  was TIA  . GERD (gastroesophageal reflux disease)     occasional  . Arthritis   . Hemorrhoids     "sometimes"  . Complication of anesthesia     "hard to wake up"  . Rosacea    Principal Problem:   OA (osteoarthritis) of knee Active Problems:   Postoperative anemia due to acute blood loss  Estimated body mass index is 40.53 kg/(m^2) as calculated from the following:   Height as of this encounter: 5\' 6"  (1.676 m).   Weight as of this encounter: 113.853 kg (251 lb). Up with therapy Discharge to SNF Diet - Cardiac diet Follow up - in 2 weeks Activity - WBAT Disposition - Skilled nursing facility Condition Upon Discharge - Good D/C Meds - See DC Summary DVT Prophylaxis - Xarelto  Afton Lavalle 05/26/2013, 7:14 AM

## 2013-05-26 NOTE — Progress Notes (Signed)
Physical Therapy Treatment Patient Details Name: MARIAISABEL BODIFORD MRN: [id number] DOB: 1941-05-07 Today's Date: 05/26/2013 Time: 5427-0623 PT Time Calculation (min): 35 min  PT Assessment / Plan / Recommendation  History of Present Illness TKR   PT Comments     Follow Up Recommendations  SNF     Does the patient have the potential to tolerate intense rehabilitation     Barriers to Discharge        Equipment Recommendations  None recommended by PT    Recommendations for Other Services    Frequency 7X/week   Progress towards PT Goals Progress towards PT goals: Progressing toward goals  Plan Current plan remains appropriate    Precautions / Restrictions Precautions Precautions: Knee Restrictions Weight Bearing Restrictions: No   Pertinent Vitals/Pain 3/10; premed, ice packs provided    Mobility  Bed Mobility Overal bed mobility: Needs Assistance Bed Mobility: Supine to Sit;Sit to Supine Supine to sit: Min assist Sit to supine: Min assist General bed mobility comments: cues for sequence, assist with L LE Transfers Overall transfer level: Needs assistance Equipment used: Rolling walker (2 wheeled) Transfers: Sit to/from Stand Sit to Stand: Min guard;Supervision General transfer comment: min cues for use of UEs Ambulation/Gait Ambulation/Gait assistance: Min guard Ambulation Distance (Feet): 140 Feet (and 20) Assistive device: Rolling walker (2 wheeled) Gait Pattern/deviations: Step-to pattern;Step-through pattern;Decreased step length - right;Decreased step length - left;Shuffle;Antalgic;Trunk flexed General Gait Details: cues for sequencing, posture and position from RW    Exercises Total Joint Exercises Ankle Circles/Pumps: AROM;Both;20 reps;Supine Quad Sets: AROM;Both;20 reps;Supine Heel Slides: AAROM;Left;20 reps;Supine Straight Leg Raises: AAROM;Left;Supine;20 reps Goniometric ROM: AAROM at L knee -10 - 95   PT Diagnosis:    PT Problem List:   PT  Treatment Interventions:     PT Goals (current goals can now be found in the care plan section) Acute Rehab PT Goals Patient Stated Goal: To get better.  PT Goal Formulation: With patient Time For Goal Achievement: 06/07/13 Potential to Achieve Goals: Good  Visit Information  Last PT Received On: 05/26/13 Assistance Needed: +1 History of Present Illness: TKR    Subjective Data  Patient Stated Goal: To get better.    Cognition  Cognition Arousal/Alertness: Awake/alert Behavior During Therapy: WFL for tasks assessed/performed Overall Cognitive Status: Within Functional Limits for tasks assessed    Balance     End of Session PT - End of Session Equipment Utilized During Treatment: Gait belt Activity Tolerance: Patient tolerated treatment well Patient left: in chair;with call bell/phone within reach Nurse Communication: Mobility status   GP     Asaiah Hunnicutt 05/26/2013, 12:28 PM

## 2013-05-27 NOTE — Progress Notes (Signed)
Clinical Social Work Department CLINICAL SOCIAL WORK PLACEMENT NOTE 05/27/2013  Patient:  Kimberly Ferrell, Kimberly Ferrell  Account Number:  [id number] Admit date:  05/22/2013  Clinical Social Worker:  Levie Heritage  Date/time:  05/24/2013 12:05 PM  Clinical Social Work is seeking post-discharge placement for this patient at the following level of care:   [place]   (*CSW will update this form in Epic as items are completed)   05/24/2013  Patient/family provided with [facility] Department of Clinical Social Work's list of facilities offering this level of care within the geographic area requested by the patient (or if unable, by the patient's family).  05/24/2013  Patient/family informed of their freedom to choose among providers that offer the needed level of care, that participate in Medicare, Medicaid or managed care program needed by the patient, have an available bed and are willing to accept the patient.  05/24/2013  Patient/family informed of [facility]' ownership interest in [facility], as well as of the fact that they are under no obligation to receive care at this facility.  PASARR submitted to EDS on 05/24/2013 PASARR number received from EDS on 05/24/2013  FL2 transmitted to all facilities in geographic area requested by pt/family on  05/24/2013 FL2 transmitted to all facilities within larger geographic area on   Patient informed that his/her managed care company has contracts with or will negotiate with  certain facilities, including the following:     Patient/family informed of bed offers received:  05/26/2013 Patient chooses bed at [facility], [place] Physician recommends and patient chooses bed at    Patient to be transferred to [facility] on  05/26/2013 Patient to be transferred to facility by [facility]  The following physician request were entered in Epic:   Additional Comments:  Werner Lean LCSW  [phone]

## 2013-06-08 ENCOUNTER — Emergency Department (HOSPITAL_COMMUNITY)
Admission: EM | Admit: 2013-06-08 | Discharge: 2013-06-08 | Disposition: A | Discharge status: Home / Self Care | Payer: Medicare Other | Attending: Emergency Medicine | Admitting: Emergency Medicine

## 2013-06-08 ENCOUNTER — Encounter (HOSPITAL_COMMUNITY): Payer: Self-pay | Admitting: Emergency Medicine

## 2013-06-08 DIAGNOSIS — Z860100 Personal history of colon polyps, unspecified: Secondary | ICD-10-CM | POA: Insufficient documentation

## 2013-06-08 DIAGNOSIS — Z9089 Acquired absence of other organs: Secondary | ICD-10-CM | POA: Insufficient documentation

## 2013-06-08 DIAGNOSIS — K5900 Constipation, unspecified: Secondary | ICD-10-CM | POA: Insufficient documentation

## 2013-06-08 DIAGNOSIS — Z7901 Long term (current) use of anticoagulants: Secondary | ICD-10-CM | POA: Insufficient documentation

## 2013-06-08 DIAGNOSIS — Z8701 Personal history of pneumonia (recurrent): Secondary | ICD-10-CM | POA: Insufficient documentation

## 2013-06-08 DIAGNOSIS — Z8739 Personal history of other diseases of the musculoskeletal system and connective tissue: Secondary | ICD-10-CM | POA: Insufficient documentation

## 2013-06-08 DIAGNOSIS — Z8619 Personal history of other infectious and parasitic diseases: Secondary | ICD-10-CM | POA: Insufficient documentation

## 2013-06-08 DIAGNOSIS — K649 Unspecified hemorrhoids: Secondary | ICD-10-CM

## 2013-06-08 DIAGNOSIS — Z8719 Personal history of other diseases of the digestive system: Secondary | ICD-10-CM | POA: Insufficient documentation

## 2013-06-08 DIAGNOSIS — Z8673 Personal history of transient ischemic attack (TIA), and cerebral infarction without residual deficits: Secondary | ICD-10-CM | POA: Insufficient documentation

## 2013-06-08 DIAGNOSIS — I10 Essential (primary) hypertension: Secondary | ICD-10-CM | POA: Insufficient documentation

## 2013-06-08 DIAGNOSIS — Z9889 Other specified postprocedural states: Secondary | ICD-10-CM | POA: Insufficient documentation

## 2013-06-08 DIAGNOSIS — Z872 Personal history of diseases of the skin and subcutaneous tissue: Secondary | ICD-10-CM | POA: Insufficient documentation

## 2013-06-08 DIAGNOSIS — Z79899 Other long term (current) drug therapy: Secondary | ICD-10-CM | POA: Insufficient documentation

## 2013-06-08 LAB — CBC WITH DIFFERENTIAL/PLATELET
Basophils Absolute: 0 10*3/uL (ref 0.0–0.1)
Basophils Relative: 0 % (ref 0–1)
Eosinophils Absolute: 0.2 10*3/uL (ref 0.0–0.7)
Eosinophils Relative: 2 % (ref 0–5)
HCT: 35.9 % — ABNORMAL LOW (ref 36.0–46.0)
HEMOGLOBIN: 12 g/dL (ref 12.0–15.0)
LYMPHS PCT: 16 % (ref 12–46)
Lymphs Abs: 2.1 10*3/uL (ref 0.7–4.0)
MCH: 32.1 pg (ref 26.0–34.0)
MCHC: 33.4 g/dL (ref 30.0–36.0)
MCV: 96 fL (ref 78.0–100.0)
MONOS PCT: 7 % (ref 3–12)
Monocytes Absolute: 0.9 10*3/uL (ref 0.1–1.0)
Neutro Abs: 9.6 10*3/uL — ABNORMAL HIGH (ref 1.7–7.7)
Neutrophils Relative %: 75 % (ref 43–77)
PLATELETS: 250 10*3/uL (ref 150–400)
RBC: 3.74 MIL/uL — AB (ref 3.87–5.11)
RDW: 14.1 % (ref 11.5–15.5)
WBC: 12.8 10*3/uL — AB (ref 4.0–10.5)

## 2013-06-08 LAB — OCCULT BLOOD, POC DEVICE: Fecal Occult Bld: POSITIVE — AB

## 2013-06-08 MED ORDER — POLYETHYLENE GLYCOL 3350 17 G PO PACK
17.0000 g | PACK | Freq: Every day | ORAL | Status: DC
Start: 2013-06-08 — End: 2013-07-02

## 2013-06-08 MED ORDER — DOCUSATE SODIUM 100 MG PO CAPS: 100.0000 mg | ORAL_CAPSULE | Freq: Two times a day (BID) | ORAL | Status: DC

## 2013-06-08 NOTE — ED Provider Notes (Signed)
CSN: [id number]     Arrival date & time 06/08/13  1252 History   First MD Initiated Contact with Patient 06/08/13 1308     Chief Complaint  Patient presents with  . Constipation  . Hemorrhoids  . Post-op Problem   (Consider location/radiation/quality/duration/timing/severity/associated sxs/prior Treatment) Patient is a 73 y.o. female presenting with hematochezia.  Rectal Bleeding Quality:  Bright red Amount:  Moderate Duration: 2 days. Timing:  Intermittent Progression:  Unchanged Chronicity:  New Context: constipation and hemorrhoids   Relieved by:  Nothing Worsened by:  Defecation and wiping Ineffective treatments:  Wipes Associated symptoms: no abdominal pain, no dizziness, no fever, no light-headedness, no loss of consciousness and no vomiting   Risk factors: anticoagulant use (on xarelto for post knee repair prophylaxis)     Past Medical History  Diagnosis Date  . Hypertension   . Chicken pox as child  . Colon polyps   . Mononucleosis   . Lung nodule seen on imaging study     noted 8/13- needs CT f/u in 1 year  . Pneumonia     hx of  . TIA (transient ischemic attack) 2007    from IV dye, no residual, passed out and was told by RN that it was TIA  . GERD (gastroesophageal reflux disease)     occasional  . Arthritis   . Hemorrhoids     "sometimes"  . Complication of anesthesia     "hard to wake up"  . Rosacea    Past Surgical History  Procedure Laterality Date  . Cholecystectomy  1992  . Abdominal hysterectomy  1993  . Hemorrhoid surgery  1960's  . 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 Cancer Mother   . Sudden death Father   . Hypertension Neg Hx   . Hyperlipidemia Neg Hx   . Heart attack Neg Hx   . Diabetes Neg Hx    History  Substance Use Topics  . Smoking  status: Never Smoker   . Smokeless tobacco: Never Used  . Alcohol Use: No   OB History   Grav Para Term Preterm Abortions TAB SAB Ect Mult Living                 Review of Systems  Constitutional: Negative for fever.  HENT: Negative for congestion.   Respiratory: Negative for cough and shortness of breath.   Cardiovascular: Negative for chest pain.  Gastrointestinal: Positive for hematochezia. Negative for nausea, vomiting, abdominal pain and diarrhea.  Neurological: Negative for dizziness, loss of consciousness and light-headedness.  All other systems reviewed and are negative.    Allergies  Other  Home Medications   Current Outpatient Rx  Name  Route  Sig  Dispense  Refill  . bisoprolol-hydrochlorothiazide (ZIAC) 2.5-6.25 MG per tablet   Oral   Take 1 tablet by mouth daily.   30 tablet   1   . methocarbamol (ROBAXIN) 500 MG tablet   Oral   Take 1 tablet (500 mg total) by mouth every 6 (six) hours as needed for muscle spasms.   80 tablet   0   . oxyCODONE (OXY IR/ROXICODONE) 5 MG immediate release tablet   Oral   Take 1-2 tablets (5-10 mg total) by mouth every 3 (three) hours as needed for breakthrough pain.  80 tablet   0   . polycarbophil (FIBERCON) 625 MG tablet   Oral   Take 625 mg by mouth daily.         . rivaroxaban (XARELTO) 10 MG TABS tablet   Oral   Take 1 tablet (10 mg total) by mouth daily with breakfast. Take Xarelto for two and a half more weeks, then discontinue Xarelto. Once the patient has completed the blood thinner regimen, then take a Baby 81 mg Aspirin daily for four more weeks.   19 tablet   0   . traMADol (ULTRAM) 50 MG tablet   Oral   Take 1-2 tablets (50-100 mg total) by mouth every 6 (six) hours as needed (mild to moderate pain).   60 tablet   1    BP 148/59  Pulse 71  Temp(Src) 98.1 F (36.7 C) (Oral)  Resp 18  SpO2 94% Physical Exam  Nursing note and vitals reviewed. Constitutional: She is oriented to person,  place, and time. She appears well-developed and well-nourished. No distress.  HENT:  Head: Normocephalic and atraumatic.  Mouth/Throat: Oropharynx is clear and moist.  Eyes: Conjunctivae are normal. Pupils are equal, round, and reactive to light. No scleral icterus.  Neck: Neck supple.  Cardiovascular: Normal rate, regular rhythm, normal heart sounds and intact distal pulses.   No murmur heard. Pulmonary/Chest: Effort normal and breath sounds normal. No stridor. No respiratory distress. She has no rales.  Abdominal: Soft. Bowel sounds are normal. She exhibits no distension. There is no tenderness.  Genitourinary:  Multiple large, soft, nonbleeding, but irritated hemorrhoids.  Normal rectal tone.  No fecal impaction.  Light brown stool.    Musculoskeletal: Normal range of motion.  Neurological: She is alert and oriented to person, place, and time.  Skin: Skin is warm and dry. No rash noted.  Psychiatric: She has a normal mood and affect. Her behavior is normal.    ED Course  Procedures (including critical care time) Labs Review Labs Reviewed  CBC WITH DIFFERENTIAL - Abnormal; Notable for the following:    WBC 12.8 (*)    RBC 3.74 (*)    HCT 35.9 (*)    Neutro Abs 9.6 (*)    All other components within normal limits  OCCULT BLOOD, POC DEVICE - Abnormal; Notable for the following:    Fecal Occult Bld POSITIVE (*)    All other components within normal limits   Imaging Review No results found.  EKG Interpretation   None       MDM   1. Hemorrhoids    73 yo female with complaint of constipation and hemorrhoids s/p knee surgery.  She is on prophylactic Xarelto.  Bleeding is bright red, and appears to be coming from her hemorrhoids.  No signs or symptoms of upper GI bleeding and doubt colonic bleed.  I spoke with Dr. Wynelle Link who was ok with discontinuing the Xarelto this far out from surgery (over two weeks).  She does not have signs of ongoing hemorrhage and I think she is  appropriate for outpatient management of hemorrhoids.  Pt and husband agree with plan.  Return precautions given.    Houston Siren, MD 06/08/13 [phone]

## 2013-06-08 NOTE — Discharge Instructions (Signed)
Stop taking your Xarelto.  Hemorrhoids Hemorrhoids are swollen veins around the rectum or anus. There are two types of hemorrhoids:   Internal hemorrhoids. These occur in the veins just inside the rectum. They may poke through to the outside and become irritated and painful.  External hemorrhoids. These occur in the veins outside the anus and can be felt as a painful swelling or hard lump near the anus. CAUSES  Pregnancy.   Obesity.   Constipation or diarrhea.   Straining to have a bowel movement.   Sitting for long periods on the toilet.  Heavy lifting or other activity that caused you to strain.  Anal intercourse. SYMPTOMS   Pain.   Anal itching or irritation.   Rectal bleeding.   Fecal leakage.   Anal swelling.   One or more lumps around the anus.  DIAGNOSIS  Your caregiver may be able to diagnose hemorrhoids by visual examination. Other examinations or tests that may be performed include:   Examination of the rectal area with a gloved hand (digital rectal exam).   Examination of anal canal using a small tube (scope).   A blood test if you have lost a significant amount of blood.  A test to look inside the colon (sigmoidoscopy or colonoscopy). TREATMENT Most hemorrhoids can be treated at home. However, if symptoms do not seem to be getting better or if you have a lot of rectal bleeding, your caregiver may perform a procedure to help make the hemorrhoids get smaller or remove them completely. Possible treatments include:   Placing a rubber band at the base of the hemorrhoid to cut off the circulation (rubber band ligation).   Injecting a chemical to shrink the hemorrhoid (sclerotherapy).   Using a tool to burn the hemorrhoid (infrared light therapy).   Surgically removing the hemorrhoid (hemorrhoidectomy).   Stapling the hemorrhoid to block blood flow to the tissue (hemorrhoid stapling).  HOME CARE INSTRUCTIONS   Eat foods with fiber,  such as whole grains, beans, nuts, fruits, and vegetables. Ask your doctor about taking products with added fiber in them (fibersupplements).  Increase fluid intake. Drink enough water and fluids to keep your urine clear or pale yellow.   Exercise regularly.   Go to the bathroom when you have the urge to have a bowel movement. Do not wait.   Avoid straining to have bowel movements.   Keep the anal area dry and clean. Use wet toilet paper or moist towelettes after a bowel movement.   Medicated creams and suppositories may be used or applied as directed.   Only take over-the-counter or prescription medicines as directed by your caregiver.   Take warm sitz baths for 15 20 minutes, 3 4 times a day to ease pain and discomfort.   Place ice packs on the hemorrhoids if they are tender and swollen. Using ice packs between sitz baths may be helpful.   Put ice in a plastic bag.   Place a towel between your skin and the bag.   Leave the ice on for 15 20 minutes, 3 4 times a day.   Do not use a donut-shaped pillow or sit on the toilet for long periods. This increases blood pooling and pain.  SEEK MEDICAL CARE IF:  You have increasing pain and swelling that is not controlled by treatment or medicine.  You have uncontrolled bleeding.  You have difficulty or you are unable to have a bowel movement.  You have pain or inflammation outside the area of  the hemorrhoids. MAKE SURE YOU:  Understand these instructions.  Will watch your condition.  Will get help right away if you are not doing well or get worse. Document Released: 04/28/2000 Document Revised: 04/17/2012 Document Reviewed: 03/05/2012 [facility] Patient Information 2014 [place].

## 2013-06-08 NOTE — ED Notes (Signed)
Pt states that she had surgery Jan 9 and has been constipated since. Yesterday started having diarrhea and blood coming from hemorrhoids.

## 2013-06-08 NOTE — ED Notes (Signed)
Patient states frank blood with bowel movements, onset yesterday.  States "it's my hemorrhoids".

## 2013-06-09 ENCOUNTER — Telehealth: Payer: Self-pay | Admitting: *Deleted

## 2013-06-09 NOTE — Telephone Encounter (Signed)
Patient called and stated that she is experiencing bleeding from hemorrhoids. She went to the ER yesterday and they stopped her blood thinner. She states she has been fine until "a few minutes ago" when the bleeding returned. Patient was advised to go back to ER. She agreed. Appt with Dr. Birdie Riddle was made for 9 am this Wednesday. JG//CMA

## 2013-06-11 ENCOUNTER — Ambulatory Visit: Payer: Medicare Other | Admitting: Family Medicine

## 2013-06-30 ENCOUNTER — Telehealth: Payer: Self-pay | Admitting: General Practice

## 2013-06-30 NOTE — Telephone Encounter (Signed)
Pt called stating that Dr. Maureen Ralphs advised her that she needed to increase her fluid pills. Pt has not bee seen since 12/14 appt was made for Wednesday morning with paz.

## 2013-07-02 ENCOUNTER — Encounter (HOSPITAL_COMMUNITY): Payer: Medicare Other

## 2013-07-02 ENCOUNTER — Encounter: Payer: Self-pay | Admitting: Internal Medicine

## 2013-07-02 ENCOUNTER — Ambulatory Visit (INDEPENDENT_AMBULATORY_CARE_PROVIDER_SITE_OTHER): Payer: Medicare Other | Admitting: Internal Medicine

## 2013-07-02 VITALS — BP 133/80 | HR 65 | Temp 97.8°F | Wt 248.0 lb

## 2013-07-02 DIAGNOSIS — R609 Edema, unspecified: Secondary | ICD-10-CM

## 2013-07-02 DIAGNOSIS — R600 Localized edema: Secondary | ICD-10-CM

## 2013-07-02 MED ORDER — FUROSEMIDE 20 MG PO TABS: 20.0000 mg | ORAL_TABLET | ORAL | Status: DC | PRN

## 2013-07-02 NOTE — Patient Instructions (Signed)
Talk with [place] about the ultrasound before you leave the office  Elevated your leg 1 hour twice a day  Take Lasix one tablet every other day in the mornings as needed for swelling

## 2013-07-02 NOTE — Progress Notes (Signed)
Subjective:    Patient ID: Kimberly Ferrell, female    DOB: 25-Sep-1940, 73 y.o.   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 Right leg is okay.   ROS Denies chest pain or palpitations. Occasionally difficulty breathing? No fever or chills No history of previous clots.    Past Medical History  Diagnosis Date  . Hypertension   . Chicken pox as child  . Colon polyps   . Mononucleosis   . Lung nodule seen on imaging study     noted 8/13- needs CT f/u in 1 year  . Pneumonia     hx of  . TIA (transient ischemic attack) 2007    from IV dye, no residual, passed out and was told by RN that it was TIA  . GERD (gastroesophageal reflux disease)     occasional  . Arthritis   . Hemorrhoids     "sometimes"  . Complication of anesthesia     "hard to wake up"  . Rosacea     Past Surgical History  Procedure Laterality Date  . Cholecystectomy  1992  . Abdominal hysterectomy  1993  . Hemorrhoid surgery  1960's  . Total knee arthroplasty Left 05/23/2013    Procedure: LEFT TOTAL KNEE ARTHROPLASTY;  Surgeon: Gearlean Alf, MD;  Location: [facility];  Service: Orthopedics;  Laterality: Left;    History   Social History  . Marital Status: Married    Spouse Name: N/A    Number of Children: N/A  . Years of Education: N/A   Occupational History  . Not on file.   Social History Main Topics  . Smoking status: Never Smoker   . Smokeless tobacco: Never Used  . Alcohol Use: No  . Drug Use: No  . Sexual Activity: Not on file   Other Topics Concern  . Not on file   Social History Narrative  . No narrative on file        Medication List       This list is accurate as of: 07/02/13 11:59 PM.  Always use your most recent med list.               bisoprolol-hydrochlorothiazide 2.5-6.25 MG per tablet  Commonly known as:  ZIAC  Take 1 tablet by mouth daily.     docusate sodium 100 MG capsule  Commonly known as:  COLACE  Take 1 capsule (100 mg total) by mouth every 12 (twelve) hours.     furosemide 20 MG tablet  Commonly known as:  LASIX  Take 1 tablet (20 mg total) by mouth every other day as needed (swelling).     oxyCODONE 5 MG immediate release tablet  Commonly known as:  Oxy IR/ROXICODONE  Take 1-2 tablets (5-10 mg total) by mouth every 3 (three) hours as needed for breakthrough pain.           Objective:   Physical Exam BP 133/80  Pulse 65  Temp(Src) 97.8 F (36.6 C)  Wt 248 lb (112.492 kg)  SpO2 95% General -- alert, well-developed, NAD.   Lungs -- normal respiratory effort, no intercostal retractions, no accessory muscle use, and normal breath sounds.  Heart-- normal rate, regular rhythm, no murmur.  Extremities-- Right leg---  normal Left leg--- Knee w/ well healing surgical scar. L calf larger by 2 inch compared with a right, no tender. Minimal pitting edema at the pretibial area. Good pedal pulses Neurologic--  alert & oriented X3. Speech normal, gait normal, strength normal in all extremities.  Psych-- Cognition and judgment appear intact. Cooperative with normal attention span and concentration. No anxious or depressed appearing.      Assessment & Plan:   Left lower extremity edema. Edema most likely related to total knee replacement, to be sure we'll get the ultrasound. Advise about leg elevation. The patient request a diuretic,will rx lasix to use  sporadically. See instructions.

## 2013-07-02 NOTE — Progress Notes (Signed)
Pre visit review using our clinic review tool, if applicable. No additional management support is needed unless otherwise documented below in the visit note. 

## 2013-07-03 ENCOUNTER — Encounter: Payer: Self-pay | Admitting: Internal Medicine

## 2013-07-04 ENCOUNTER — Ambulatory Visit (HOSPITAL_COMMUNITY): Payer: Medicare Other

## 2013-07-11 ENCOUNTER — Telehealth: Payer: Self-pay | Admitting: Internal Medicine

## 2013-07-11 NOTE — Telephone Encounter (Signed)
Advise patient, ultrasound 07-03-13 negative for DVT.

## 2013-07-11 NOTE — Telephone Encounter (Signed)
Pt states had U/S done at [facility] last week.

## 2013-07-11 NOTE — Telephone Encounter (Signed)
Pt.notified

## 2013-07-11 NOTE — Telephone Encounter (Signed)
Please call and get the report

## 2013-07-11 NOTE — Telephone Encounter (Signed)
Patient was recommended to have a ultrasound, I don't see a report, please call patient, I still recommend she proceeds w/  the ultrasound

## 2013-07-11 NOTE — Telephone Encounter (Signed)
Done

## 2013-07-22 ENCOUNTER — Other Ambulatory Visit: Payer: Self-pay

## 2013-07-22 DIAGNOSIS — Z1231 Encounter for screening mammogram for malignant neoplasm of breast: Secondary | ICD-10-CM

## 2013-08-12 ENCOUNTER — Ambulatory Visit
Admission: RE | Admit: 2013-08-12 | Discharge: 2013-08-12 | Disposition: A | Discharge status: Home / Self Care | Payer: Medicare Other | Source: Ambulatory Visit

## 2013-08-12 DIAGNOSIS — Z1231 Encounter for screening mammogram for malignant neoplasm of breast: Secondary | ICD-10-CM

## 2014-04-06 ENCOUNTER — Ambulatory Visit (INDEPENDENT_AMBULATORY_CARE_PROVIDER_SITE_OTHER): Payer: Medicare Other | Admitting: Gynecology

## 2014-04-06 ENCOUNTER — Encounter: Payer: Self-pay | Admitting: Gynecology

## 2014-04-06 VITALS — BP 140/90

## 2014-04-06 DIAGNOSIS — N939 Abnormal uterine and vaginal bleeding, unspecified: Secondary | ICD-10-CM

## 2014-04-06 NOTE — Progress Notes (Signed)
Kimberly Ferrell 1941-04-29 [id number]        73 y.o.  New patient presents complaining of vaginal bleeding. Patient went to the bathroom several days ago and noticed bright red blood in the toilet. Feels that it was vaginal and not rectal. Also felt a small knot around the opening of her urethra.  Never noticed this before. Not painful. No problems urinating as far as frequency dysuria or urgency. No abdominal pain or difficulties with bowel movements. Does have external hemorrhoids.  Status post TAH a number of years ago for irregular bleeding.  Past medical history,surgical history, problem list, medications, allergies, family history and social history were all reviewed and documented in the EPIC chart.  Directed ROS with pertinent positives and negatives documented in the history of present illness/assessment and plan.  Exam: Kim assistant General appearance:  Normal Abdomen soft nontender without masses guarding rebound. Pelvic external BUS vagina with atrophic changes.  Small blue nodule around the opening to the urethra. Questionable urethral cyst/mild urethral prolapse. But does not appear classic for prolapse. Vagina otherwise unremarkable without lesions or bleeding. Bimanual without masses or tenderness. Rectal exam normal noting old external hemorrhoids not bleeding.  Assessment/Plan:  73 y.o. with episode of vaginal bleeding with urination. Exam shows small bluish periurethral nodule questionable cyst versus atypical prolapse. Not actively bleeding. Recommend urologic evaluation and we will help schedule this appointment. Check urinalysis today     Anastasio Auerbach MD, 9:18 AM 04/06/2014

## 2014-04-06 NOTE — Patient Instructions (Signed)
Office will contact you to arrange urology appointment.

## 2014-04-07 ENCOUNTER — Telehealth: Payer: Self-pay | Admitting: *Deleted

## 2014-04-07 LAB — URINALYSIS W MICROSCOPIC + REFLEX CULTURE
Bilirubin Urine: NEGATIVE
CASTS: NONE SEEN
Crystals: NONE SEEN
GLUCOSE, UA: NEGATIVE mg/dL
HGB URINE DIPSTICK: NEGATIVE
KETONES UR: NEGATIVE mg/dL
Nitrite: NEGATIVE
Protein, ur: NEGATIVE mg/dL
Specific Gravity, Urine: 1.018 (ref 1.005–1.030)
Urobilinogen, UA: 0.2 mg/dL (ref 0.0–1.0)
pH: 6 (ref 5.0–8.0)

## 2014-04-07 NOTE — Telephone Encounter (Signed)
-----   Message from Anastasio Auerbach, MD sent at 04/06/2014  9:24 AM EST ----- Schedule a urology appointment reference episode of vaginal bleeding with periurethral nodule noted.

## 2014-04-07 NOTE — Telephone Encounter (Signed)
Appointment on 05/04/14 @ 1:00pm with Dr.Nesi , notes faxed. Pt informed.

## 2014-04-08 ENCOUNTER — Other Ambulatory Visit: Payer: Self-pay | Admitting: Gynecology

## 2014-04-08 MED ORDER — AMPICILLIN 500 MG PO CAPS: 500.0000 mg | ORAL_CAPSULE | Freq: Four times a day (QID) | ORAL | Status: DC

## 2014-04-10 LAB — URINE CULTURE

## 2014-07-06 DIAGNOSIS — R351 Nocturia: Secondary | ICD-10-CM | POA: Diagnosis not present

## 2014-07-06 DIAGNOSIS — N393 Stress incontinence (female) (male): Secondary | ICD-10-CM | POA: Diagnosis not present

## 2014-07-13 ENCOUNTER — Other Ambulatory Visit: Payer: Self-pay

## 2014-07-13 DIAGNOSIS — Z1231 Encounter for screening mammogram for malignant neoplasm of breast: Secondary | ICD-10-CM

## 2014-07-21 ENCOUNTER — Encounter: Payer: Self-pay | Admitting: *Deleted

## 2014-07-23 ENCOUNTER — Encounter: Payer: Self-pay | Admitting: Gynecologic Oncology

## 2014-07-23 ENCOUNTER — Ambulatory Visit: Payer: Medicare Other | Attending: Gynecologic Oncology | Admitting: Gynecologic Oncology

## 2014-07-23 ENCOUNTER — Ambulatory Visit: Payer: Medicare Other

## 2014-07-23 VITALS — BP 161/75 | HR 67 | Temp 97.9°F | Resp 16 | Ht 66.0 in | Wt 249.0 lb

## 2014-07-23 DIAGNOSIS — Z96652 Presence of left artificial knee joint: Secondary | ICD-10-CM | POA: Diagnosis not present

## 2014-07-23 DIAGNOSIS — I10 Essential (primary) hypertension: Secondary | ICD-10-CM | POA: Insufficient documentation

## 2014-07-23 DIAGNOSIS — R350 Frequency of micturition: Secondary | ICD-10-CM

## 2014-07-23 DIAGNOSIS — Z8673 Personal history of transient ischemic attack (TIA), and cerebral infarction without residual deficits: Secondary | ICD-10-CM | POA: Diagnosis not present

## 2014-07-23 DIAGNOSIS — N9489 Other specified conditions associated with female genital organs and menstrual cycle: Secondary | ICD-10-CM | POA: Diagnosis not present

## 2014-07-23 DIAGNOSIS — Z90710 Acquired absence of both cervix and uterus: Secondary | ICD-10-CM | POA: Diagnosis not present

## 2014-07-23 DIAGNOSIS — N898 Other specified noninflammatory disorders of vagina: Secondary | ICD-10-CM | POA: Diagnosis not present

## 2014-07-23 DIAGNOSIS — Z9049 Acquired absence of other specified parts of digestive tract: Secondary | ICD-10-CM | POA: Diagnosis not present

## 2014-07-23 DIAGNOSIS — N368 Other specified disorders of urethra: Secondary | ICD-10-CM | POA: Diagnosis not present

## 2014-07-23 LAB — URINALYSIS, MICROSCOPIC - CHCC
Bilirubin (Urine): NEGATIVE
GLUCOSE UR CHCC: NEGATIVE mg/dL
Ketones: NEGATIVE mg/dL
LEUKOCYTE ESTERASE: NEGATIVE
Nitrite: NEGATIVE
PH: 6 (ref 4.6–8.0)
Protein: NEGATIVE mg/dL
SPECIFIC GRAVITY, URINE: 1.015 (ref 1.003–1.035)
Urobilinogen, UR: 0.2 mg/dL (ref 0.2–1)

## 2014-07-23 NOTE — Patient Instructions (Signed)
We will let you know about your biopsy results. Please call our office with any new questions or concerns.

## 2014-07-23 NOTE — Progress Notes (Signed)
Consult Note: Gyn-Onc  Consult was requested by Dr. Matilde Sprang for the evaluation of Kimberly Ferrell 74 y.o. female with a suburethral nodule  CC:  Chief Complaint  Patient presents with  . Vaginal Lesion    Assessment/Plan:  Ms. Kimberly Ferrell  is a 74 y.o.  year old with a suburethral nodule. 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 We performed biopsy today to rule out vaginal cancer (but I do not believe that its appearance is consistent with that).   Skene's duct cysts are typically managed expectantly. They can be associated with recurrent urinary tract infections or even potentially abscesses. We obtained a urinary culture today to rule out UTI given the patient's symptoms of urinary frequency. Based on my clinical exam findings I do not believe that this cyst is superinfected with an abscess. Chronically symptomatic cysts can be excised, or marsupialized, however I would only perform such a surgery if it became significantly symptomatic.   HPI: Kimberly Ferrell is a 74 year old gravida 3 para 3 who is seen in consultation at the request of Dr. McDiarmid for a suburethral vaginal nodule. The patient reports that approximately 3 weeks ago she passed tissue into the toilet with blood clots after voiding urine. Since that time she's had intermittent a very light vaginal spotting. She was seen by her gynecologist Dr. Phineas Real. The patient has a history of her remote hysterectomy for benign indications. Dr. Phineas Real performed an examination  which revealed a fleshy, purplish, vascular appearing nodule suburethrally. This was in the distal vagina. He referred the patient to Dr. McDiarmid who evaluated the patient and felt that it was unlikely to be a urethral diverticulum. She is seen by asked referred by Dr. McDiarmid for evaluation of this mass.  Interval History: persistent vaginal spotting. In the past week she has had  urinary frequency and left flank pain.   Current Meds:  Outpatient Encounter Prescriptions as of 07/23/2014  Medication Sig  . bisoprolol-hydrochlorothiazide (ZIAC) 2.5-6.25 MG per tablet Take 1 tablet by mouth daily.  Marland Kitchen METRONIDAZOLE, TOPICAL, 0.75 % LOTN Apply topically as needed.  . solifenacin (VESICARE) 10 MG tablet Take 10 mg by mouth daily.  . [DISCONTINUED] ampicillin (PRINCIPEN) 500 MG capsule Take 1 capsule (500 mg total) by mouth 4 (four) times daily. (Patient not taking: Reported on 07/23/2014)  . [DISCONTINUED] azithromycin (ZITHROMAX) 250 MG tablet Take by mouth daily.  . [DISCONTINUED] HYDROcodone-homatropine (HYCODAN) 5-1.5 MG/5ML syrup Take 5 mLs by mouth every 6 (six) hours as needed for cough.    Allergy:  Allergies  Allergen Reactions  . Other Other (See Comments)    IV dye, heart stopped    Social Hx:   History   Social History  . Marital Status: Married    Spouse Name: N/A  . Number of Children: N/A  . Years of Education: N/A   Occupational History  . Not on file.   Social History Main Topics  . Smoking status: Never Smoker   . Smokeless tobacco: Never Used  . Alcohol Use: No  . Drug Use: No  . Sexual Activity: Not on file   Other Topics Concern  . Not on file   Social History Narrative    Past Surgical Hx:  Past Surgical History  Procedure Laterality Date  . Cholecystectomy  1992  . Abdominal hysterectomy  1993  . Hemorrhoid surgery  1960's  . Total knee arthroplasty Left 05/23/2013  Procedure: LEFT TOTAL KNEE ARTHROPLASTY;  Surgeon: Gearlean Alf, MD;  Location: [facility];  Service: Orthopedics;  Laterality: Left;    Past Medical Hx:  Past Medical History  Diagnosis Date  . Hypertension   . Chicken pox as child  . Colon polyps   . Mononucleosis   . Lung nodule seen on imaging study     noted 8/13- needs CT f/u in 1 year  . Pneumonia     hx of  . TIA (transient ischemic attack) 2007    from IV dye, no residual, passed out and was  told by RN that it was TIA  . GERD (gastroesophageal reflux disease)     occasional  . Arthritis   . Hemorrhoids     "sometimes"  . Complication of anesthesia     "hard to wake up"  . Rosacea     Past Gynecological History:  Remote hysterectomy for benign indications. SVD x 3.  No LMP recorded. Patient has had a hysterectomy.  Family Hx:  Family History  Problem Relation Age of Onset  . Cancer Mother   . Sudden death Father   . Hypertension Neg Hx   . Hyperlipidemia Neg Hx   . Heart attack Neg Hx   . Diabetes Neg Hx     Review of Systems:  Constitutional  Feels well,    ENT Normal appearing ears and nares bilaterally Skin/Breast  No rash, sores, jaundice, itching, dryness Cardiovascular  No chest pain, shortness of breath, or edema  Pulmonary  No cough or wheeze.  Gastro Intestinal  No nausea, vomitting, or diarrhoea. No bright red blood per rectum, no abdominal pain, change in bowel movement, or constipation.  Genito Urinary  No frequency, urgency, dysuria, + frequency Musculo Skeletal  No myalgia, arthralgia, joint swelling or pain  Neurologic  No weakness, numbness, change in gait,  Psychology  No depression, anxiety, insomnia.   Vitals:  Blood pressure 161/75, pulse 67, temperature 97.9 F (36.6 C), temperature source Oral, resp. rate 16, height 5\' 6"  (1.676 m), weight 249 lb (112.946 kg).  Physical Exam: WD in NAD Neck  Supple NROM, without any enlargements.  Lymph Node Survey No cervical supraclavicular or inguinal adenopathy Cardiovascular  Pulse normal rate, regularity and rhythm. S1 and S2 normal.  Lungs  Clear to auscultation bilateraly, without wheezes/crackles/rhonchi. Good air movement.  Skin  No rash/lesions/breakdown  Psychiatry  Alert and oriented to person, place, and time  Abdomen  Normoactive bowel sounds, abdomen soft, non-tender and obese without evidence of hernia.  Back No CVA tenderness Genito Urinary  Vulva/vagina: Normal  external female genitalia.  No lesions. No discharge or bleeding.  Bladder/urethra:  No lesions or masses, well supported bladder  Vagina: A 2 x 1 cm fleshy soft, compressible cystic mass is arising from the distal urethra on the anterior wall of the vagina. The overlying vaginal mucosa is grossly normal with the exception of a purplish hue. There are no mucosal lesions apparent.  Procedure (vaginal biopsy): After obtaining verbal consent from the patient to perform a vaginal biopsy the cystic mass was infiltrated with 1% lidocaine after swallowing the area with Betadine. The Kevorkian biopsy forcep was used to take a piece of tissue from the overlying vaginal mucosa. Bleeding was made hemostatic with silver nitrate sticks. The patient tolerated the procedure well. Specimen was sent apparent pathology.  Cervix: surgically absent  Uterus: surgically absent   Adnexa: no masses. Rectal  deferred Extremities  No bilateral cyanosis, clubbing or  edema.   Donaciano Eva, MD   07/23/2014, 3:35 PM

## 2014-07-24 LAB — URINE CULTURE

## 2014-07-27 ENCOUNTER — Telehealth: Payer: Self-pay | Admitting: *Deleted

## 2014-07-27 NOTE — Telephone Encounter (Signed)
Per Joylene John, NP patient called and notified that biopsy results did not show cancer. Also, let patient know that the urine sample did not show a UTI. Told patient she can followup with a gynecologist or Dr. Matilde Sprang. Patient agreeable to this and very appreciative of the call.

## 2014-08-17 ENCOUNTER — Ambulatory Visit
Admission: RE | Admit: 2014-08-17 | Discharge: 2014-08-17 | Disposition: A | Discharge status: Home / Self Care | Payer: Medicare Other | Source: Ambulatory Visit

## 2014-08-17 DIAGNOSIS — Z1231 Encounter for screening mammogram for malignant neoplasm of breast: Secondary | ICD-10-CM | POA: Diagnosis not present

## 2014-10-01 DIAGNOSIS — L719 Rosacea, unspecified: Secondary | ICD-10-CM | POA: Diagnosis not present

## 2014-10-15 ENCOUNTER — Encounter: Payer: Self-pay | Admitting: Internal Medicine

## 2014-11-18 DIAGNOSIS — M1711 Unilateral primary osteoarthritis, right knee: Secondary | ICD-10-CM | POA: Diagnosis not present

## 2014-12-21 ENCOUNTER — Ambulatory Visit
Admission: RE | Admit: 2014-12-21 | Discharge: 2014-12-21 | Disposition: A | Discharge status: Home / Self Care | Payer: Medicare Other | Source: Ambulatory Visit | Attending: Family Medicine | Admitting: Family Medicine

## 2014-12-21 ENCOUNTER — Other Ambulatory Visit: Payer: Self-pay | Admitting: Family Medicine

## 2014-12-21 DIAGNOSIS — R05 Cough: Secondary | ICD-10-CM | POA: Diagnosis not present

## 2014-12-21 DIAGNOSIS — J189 Pneumonia, unspecified organism: Secondary | ICD-10-CM

## 2014-12-21 DIAGNOSIS — J181 Lobar pneumonia, unspecified organism: Principal | ICD-10-CM

## 2014-12-21 DIAGNOSIS — A481 Legionnaires' disease: Secondary | ICD-10-CM | POA: Diagnosis not present

## 2014-12-23 DIAGNOSIS — J188 Other pneumonia, unspecified organism: Secondary | ICD-10-CM | POA: Diagnosis not present

## 2014-12-23 DIAGNOSIS — A481 Legionnaires' disease: Secondary | ICD-10-CM | POA: Diagnosis not present

## 2014-12-28 DIAGNOSIS — A481 Legionnaires' disease: Secondary | ICD-10-CM | POA: Diagnosis not present

## 2014-12-31 DIAGNOSIS — M1711 Unilateral primary osteoarthritis, right knee: Secondary | ICD-10-CM | POA: Diagnosis not present

## 2015-01-27 DIAGNOSIS — M1711 Unilateral primary osteoarthritis, right knee: Secondary | ICD-10-CM | POA: Diagnosis not present

## 2015-02-05 DIAGNOSIS — M1711 Unilateral primary osteoarthritis, right knee: Secondary | ICD-10-CM | POA: Diagnosis not present

## 2015-02-12 DIAGNOSIS — M1711 Unilateral primary osteoarthritis, right knee: Secondary | ICD-10-CM | POA: Diagnosis not present

## 2015-04-02 DIAGNOSIS — M1711 Unilateral primary osteoarthritis, right knee: Secondary | ICD-10-CM | POA: Diagnosis not present

## 2015-05-26 DIAGNOSIS — I10 Essential (primary) hypertension: Secondary | ICD-10-CM | POA: Diagnosis not present

## 2015-05-26 DIAGNOSIS — M545 Low back pain: Secondary | ICD-10-CM | POA: Diagnosis not present

## 2015-05-26 DIAGNOSIS — L989 Disorder of the skin and subcutaneous tissue, unspecified: Secondary | ICD-10-CM | POA: Diagnosis not present

## 2015-05-26 DIAGNOSIS — Z9181 History of falling: Secondary | ICD-10-CM | POA: Diagnosis not present

## 2015-05-26 DIAGNOSIS — Z1389 Encounter for screening for other disorder: Secondary | ICD-10-CM | POA: Diagnosis not present

## 2015-05-26 DIAGNOSIS — G5792 Unspecified mononeuropathy of left lower limb: Secondary | ICD-10-CM | POA: Diagnosis not present

## 2015-06-25 DIAGNOSIS — I10 Essential (primary) hypertension: Secondary | ICD-10-CM | POA: Diagnosis not present

## 2015-07-12 ENCOUNTER — Other Ambulatory Visit: Payer: Self-pay

## 2015-07-12 DIAGNOSIS — Z1231 Encounter for screening mammogram for malignant neoplasm of breast: Secondary | ICD-10-CM

## 2015-08-19 ENCOUNTER — Ambulatory Visit
Admission: RE | Admit: 2015-08-19 | Discharge: 2015-08-19 | Disposition: A | Discharge status: Home / Self Care | Payer: Medicare Other | Source: Ambulatory Visit

## 2015-08-19 DIAGNOSIS — Z1231 Encounter for screening mammogram for malignant neoplasm of breast: Secondary | ICD-10-CM | POA: Diagnosis not present

## 2015-10-06 ENCOUNTER — Encounter: Payer: Self-pay | Admitting: Internal Medicine

## 2015-10-07 DIAGNOSIS — C44319 Basal cell carcinoma of skin of other parts of face: Secondary | ICD-10-CM | POA: Diagnosis not present

## 2015-10-07 DIAGNOSIS — L718 Other rosacea: Secondary | ICD-10-CM | POA: Diagnosis not present

## 2015-12-01 ENCOUNTER — Other Ambulatory Visit: Payer: Self-pay | Admitting: Physician Assistant

## 2015-12-01 DIAGNOSIS — Z23 Encounter for immunization: Secondary | ICD-10-CM | POA: Diagnosis not present

## 2015-12-01 DIAGNOSIS — E2839 Other primary ovarian failure: Secondary | ICD-10-CM

## 2015-12-01 DIAGNOSIS — Z0000 Encounter for general adult medical examination without abnormal findings: Secondary | ICD-10-CM | POA: Diagnosis not present

## 2015-12-01 DIAGNOSIS — I10 Essential (primary) hypertension: Secondary | ICD-10-CM | POA: Diagnosis not present

## 2015-12-01 DIAGNOSIS — Z1389 Encounter for screening for other disorder: Secondary | ICD-10-CM | POA: Diagnosis not present

## 2015-12-01 DIAGNOSIS — Z139 Encounter for screening, unspecified: Secondary | ICD-10-CM | POA: Diagnosis not present

## 2015-12-15 ENCOUNTER — Ambulatory Visit
Admission: RE | Admit: 2015-12-15 | Discharge: 2015-12-15 | Disposition: A | Discharge status: Home / Self Care | Payer: Medicare Other | Source: Ambulatory Visit | Attending: Physician Assistant | Admitting: Physician Assistant

## 2015-12-15 DIAGNOSIS — M85852 Other specified disorders of bone density and structure, left thigh: Secondary | ICD-10-CM | POA: Diagnosis not present

## 2015-12-15 DIAGNOSIS — E2839 Other primary ovarian failure: Secondary | ICD-10-CM

## 2015-12-15 DIAGNOSIS — Z780 Asymptomatic menopausal state: Secondary | ICD-10-CM | POA: Diagnosis not present

## 2015-12-16 DIAGNOSIS — Z85828 Personal history of other malignant neoplasm of skin: Secondary | ICD-10-CM | POA: Diagnosis not present

## 2015-12-16 DIAGNOSIS — L718 Other rosacea: Secondary | ICD-10-CM | POA: Diagnosis not present

## 2015-12-17 ENCOUNTER — Encounter: Payer: Self-pay | Admitting: Internal Medicine

## 2015-12-28 DIAGNOSIS — D3131 Benign neoplasm of right choroid: Secondary | ICD-10-CM | POA: Diagnosis not present

## 2015-12-28 DIAGNOSIS — H2513 Age-related nuclear cataract, bilateral: Secondary | ICD-10-CM | POA: Diagnosis not present

## 2016-01-05 ENCOUNTER — Ambulatory Visit: Payer: Medicare Other | Admitting: Family Medicine

## 2016-01-20 DIAGNOSIS — Z471 Aftercare following joint replacement surgery: Secondary | ICD-10-CM | POA: Diagnosis not present

## 2016-01-20 DIAGNOSIS — M1711 Unilateral primary osteoarthritis, right knee: Secondary | ICD-10-CM | POA: Diagnosis not present

## 2016-01-20 DIAGNOSIS — Z96652 Presence of left artificial knee joint: Secondary | ICD-10-CM | POA: Diagnosis not present

## 2016-01-31 ENCOUNTER — Encounter: Payer: Self-pay | Admitting: Family Medicine

## 2016-01-31 ENCOUNTER — Ambulatory Visit (INDEPENDENT_AMBULATORY_CARE_PROVIDER_SITE_OTHER): Payer: Medicare Other | Admitting: Family Medicine

## 2016-01-31 VITALS — BP 153/79 | HR 61 | Ht 64.25 in | Wt 240.7 lb

## 2016-01-31 DIAGNOSIS — L719 Rosacea, unspecified: Secondary | ICD-10-CM

## 2016-01-31 DIAGNOSIS — G458 Other transient cerebral ischemic attacks and related syndromes: Secondary | ICD-10-CM | POA: Diagnosis not present

## 2016-01-31 DIAGNOSIS — I10 Essential (primary) hypertension: Secondary | ICD-10-CM

## 2016-01-31 DIAGNOSIS — G459 Transient cerebral ischemic attack, unspecified: Secondary | ICD-10-CM | POA: Insufficient documentation

## 2016-01-31 MED ORDER — OLMESARTAN-AMLODIPINE-HCTZ 20-5-12.5 MG PO TABS: 0.5000 | ORAL_TABLET | Freq: Every day | ORAL | 0 refills | Status: DC

## 2016-01-31 MED ORDER — ASPIRIN EC 81 MG PO TBEC: 81.0000 mg | DELAYED_RELEASE_TABLET | Freq: Every day | ORAL | 3 refills | Status: DC

## 2016-01-31 NOTE — Assessment & Plan Note (Addendum)
2008- hospitalized for R sided TIA with L sided weakness in arm and leg-->   Totally resolved.  I can not see notes from that hospital visit.   - Will refer patient to neurology for further evaluation of her dizziness.  She was supposed to follow-up with them and never did.

## 2016-01-31 NOTE — Progress Notes (Signed)
New patient office visit note:  Impression and Recommendations:    1. Essential hypertension   2. Other specified transient cerebral ischemias   3. Rosacea     TIA (transient ischemic attack) 2008- hospitalized for R sided TIA with L sided weakness in arm and leg-->   Totally resolved.  I can not see notes from that hospital visit.   - Will refer patient to neurology for further evaluation of her dizziness.  She was supposed to follow-up with them and never did.  HTN (hypertension) -We'll change patient's blood pressure medicine and take her off the beta blocker. - Importance of labs in the near future discussed with patient. Will get our routine ones for elderly. - Ambulatory blood pressure monitoring discussed with patient, bring in log next OV  - Asked patient to get me her medical records from her other physicians.  Orders Placed This Encounter  Procedures  . Ambulatory referral to Neurology    New Prescriptions   AMLODIPINE (NORVASC) 5 MG TABLET    Take 1 tablet (5 mg total) by mouth daily.   ASPIRIN EC 81 MG TABLET    Take 1 tablet (81 mg total) by mouth daily.   HYDROCHLOROTHIAZIDE (HYDRODIURIL) 12.5 MG TABLET    Take 1 tablet (12.5 mg total) by mouth daily.   VALSARTAN (DIOVAN) 40 MG TABLET    Take 1 tablet (40 mg total) by mouth daily.    Modified Medications   No medications on file    Discontinued Medications   BISOPROLOL-HYDROCHLOROTHIAZIDE (ZIAC) 2.5-6.25 MG PER TABLET    Take 1 tablet by mouth daily.   OLMESARTAN-AMLODIPINE-HCTZ 20-5-12.5 MG TABS    Take 0.5 tablets by mouth daily.   SOLIFENACIN (VESICARE) 10 MG TABLET    Take 10 mg by mouth daily.    Current Meds  Medication Sig  . METRONIDAZOLE, TOPICAL, 0.75 % LOTN Apply topically as needed.  . [DISCONTINUED] bisoprolol-hydrochlorothiazide (ZIAC) 2.5-6.25 MG per tablet Take 1 tablet by mouth daily.    The patient was counseled, risk factors were discussed, anticipatory guidance  given.  Gross side effects, risk and benefits, and alternatives of medications discussed with patient.  Patient is aware that all medications have potential side effects and we are unable to predict every side effect or drug-drug interaction that may occur.  Expresses verbal understanding and consents to current therapy plan and treatment regimen.  Return in about 4 weeks (around 02/28/2016) for Follow-up current med issues- started new BP meds- come fasting- will get labs.  Please see AVS handed out to patient at the end of our visit for further patient instructions/ counseling done pertaining to today's office visit.    Note: This document was prepared using Dragon voice recognition software and may include unintentional dictation errors.  ----------------------------------------------------------------------------------------------------------------------    Subjective:    Chief Complaint  Patient presents with  . Establish Care    HPI: Kimberly Ferrell is a pleasant 75 y.o. female who presents to [place] at [facility] today to review their medical history with me and establish care.     No PCP several yrs now.  Lost to f/up.  I reviewed patient's medical history in full today and the chart was updated under the direction of the patient.   She endorses a history of TIA, hypertension, and rosacea, as well as various orthopedic-joint and muscle aches which she attributes to age.  Sees orthopedics.   H/o htn 1.5 YRS  NOW.  Well controlled.  Is currently on bisoprolol/ hydrochlorothiazide.  Explained to patient this beta blocker medicine can be contributing to her symptoms and we will switch it.  Sees Derm for rosacea   H/o TIA- seen ED in 2008 and  She has had repeated episodes since then- about every 6 mo or so.  Last times was 3-4 wks ago.  Gets lightheaded and hot- like a hot flash and then passes out.  Goes out for just a few minutes.  Pt told her PCP at the  time and he never did anything about it.  Dr Cyndi Bender in [place].  Never had a f/up with neuro or anything. No irreg HBeats etc.  No dizziness.  No chronic HA's, ringing in her ear, chest pain, shortness of breath, visual changes, palpitations, increasing dyspnea on exertion, orthopnea, claudication.  No recent memory loss, episodes of dysarthria, dysphasia etc.  H/o L TKA and needs R TKA and dr Elmyra Ricks won't touch her until she gets eval from Neuro re: TIA's/ passing out.  That is really why pt is here today.     Wt Readings from Last 3 Encounters:  01/31/16 240 lb 11.2 oz (109.2 kg)  07/23/14 249 lb (112.9 kg)  07/02/13 248 lb (112.5 kg)   BP Readings from Last 3 Encounters:  01/31/16 (!) 153/79  07/23/14 (!) 161/75  04/06/14 140/90   Pulse Readings from Last 3 Encounters:  01/31/16 61  07/23/14 67  07/02/13 65   BMI Readings from Last 3 Encounters:  01/31/16 41.00 kg/m  07/23/14 40.19 kg/m  07/02/13 40.03 kg/m      Patient Active Problem List   Diagnosis Date Noted  . TIA (transient ischemic attack) 01/31/2016  . Vaginal mass 07/23/2014  . OA (osteoarthritis) of knee 05/23/2013  . OAB (overactive bladder) 04/16/2013  . Acute bronchitis 04/16/2013  . Post-nasal drip 05/30/2012  . Finger injury 05/30/2012  . Myalgia and myositis 01/30/2012  . Lung nodule seen on imaging study 01/23/2012  . Flank pain 01/22/2012  . Radicular low back pain 01/22/2012  . Rosacea 06/25/2011  . HTN (hypertension) 06/15/2011  . Back pain 06/15/2011  . Urinary frequency 06/15/2011     Past Medical History:  Diagnosis Date  . Arthritis   . Chicken pox as child  . Colon polyps   . Complication of anesthesia    "hard to wake up"  . GERD (gastroesophageal reflux disease)    occasional  . Hemorrhoids    "sometimes"  . Hypertension   . Lung nodule seen on imaging study    noted 8/13- needs CT f/u in 1 year  . Mononucleosis   . Pneumonia    hx of  . Rosacea   . TIA  (transient ischemic attack) 2007   from IV dye, no residual, passed out and was told by RN that it was TIA     Past Medical History:  Diagnosis Date  . Arthritis   . Chicken pox as child  . Colon polyps   . Complication of anesthesia    "hard to wake up"  . GERD (gastroesophageal reflux disease)    occasional  . Hemorrhoids    "sometimes"  . Hypertension   . Lung nodule seen on imaging study    noted 8/13- needs CT f/u in 1 year  . Mononucleosis   . Pneumonia    hx of  . Rosacea   . TIA (transient ischemic attack) 2007   from IV dye, no residual,  passed out and was told by RN that it was TIA     Past Surgical History:  Procedure Laterality Date  . ABDOMINAL HYSTERECTOMY  1993  . CHOLECYSTECTOMY  1992  . HEMORRHOID SURGERY  1960's  . TOTAL KNEE ARTHROPLASTY Left 05/23/2013   Procedure: LEFT TOTAL KNEE ARTHROPLASTY;  Surgeon: Gearlean Alf, MD;  Location: [facility];  Service: Orthopedics;  Laterality: Left;     Family History  Problem Relation Age of Onset  . Cancer Mother 1    colon  . Sudden death Father 31    suicide  . Stroke Maternal Grandmother 78  . Hypertension Neg Hx   . Hyperlipidemia Neg Hx   . Heart attack Neg Hx   . Diabetes Neg Hx      History  Drug Use No    History  Alcohol Use No    History  Smoking Status  . Never Smoker  Smokeless Tobacco  . Never Used     Patient's Medications  New Prescriptions   AMLODIPINE (NORVASC) 5 MG TABLET    Take 1 tablet (5 mg total) by mouth daily.   ASPIRIN EC 81 MG TABLET    Take 1 tablet (81 mg total) by mouth daily.   HYDROCHLOROTHIAZIDE (HYDRODIURIL) 12.5 MG TABLET    Take 1 tablet (12.5 mg total) by mouth daily.   VALSARTAN (DIOVAN) 40 MG TABLET    Take 1 tablet (40 mg total) by mouth daily.  Previous Medications   METRONIDAZOLE, TOPICAL, 0.75 % LOTN    Apply topically as needed.  Modified Medications   No medications on file  Discontinued Medications   BISOPROLOL-HYDROCHLOROTHIAZIDE (ZIAC)  2.5-6.25 MG PER TABLET    Take 1 tablet by mouth daily.   OLMESARTAN-AMLODIPINE-HCTZ 20-5-12.5 MG TABS    Take 0.5 tablets by mouth daily.   SOLIFENACIN (VESICARE) 10 MG TABLET    Take 10 mg by mouth daily.    Allergies: Other  Review of Systems  Constitutional: Negative.   HENT: Negative.   Eyes: Negative.   Respiratory: Negative.   Cardiovascular: Negative.   Gastrointestinal: Negative.   Genitourinary: Negative.   Musculoskeletal: Negative.   Skin: Negative.   Neurological: Negative.   Endo/Heme/Allergies: Negative.   Psychiatric/Behavioral: Negative.      Objective:    Blood pressure (!) 153/79, pulse 61, height 5' 4.25" (1.632 m), weight 240 lb 11.2 oz (109.2 kg). Body mass index is 41 kg/m. General: Well Developed, well nourished, and in no acute distress.  Neuro: Alert and oriented x3, extra-ocular muscles intact, sensation grossly intact.  HEENT: Normocephalic, atraumatic, pupils equal round reactive to light, neck supple, no gross masses, no carotid bruits, no JVD apprec Skin: no gross suspicious lesions or rashes  Cardiac: Regular rate and rhythm, no murmurs rubs or gallops.  Respiratory: Essentially clear to auscultation bilaterally. Not using accessory muscles, speaking in full sentences.  Abdominal: Soft, not grossly distended Musculoskeletal: Ambulates w/o diff, FROM * 4 ext.  Vasc: less 2 sec cap RF, warm and pink  Psych:  No HI/SI, judgement and insight good, Euthymic mood. Full Affect.

## 2016-01-31 NOTE — Patient Instructions (Addendum)
Patient just had blood work with her PCPs office 1 month ago. We will await to receive those results prior to ordering more.     - referral to [facility] placed for eval of TIA's  Change BP med - d/c old one and start new one.

## 2016-02-02 ENCOUNTER — Telehealth: Payer: Self-pay

## 2016-02-02 ENCOUNTER — Other Ambulatory Visit (HOSPITAL_COMMUNITY): Payer: Self-pay | Admitting: Family Medicine

## 2016-02-02 MED ORDER — VALSARTAN 40 MG PO TABS: 40.0000 mg | ORAL_TABLET | Freq: Every day | ORAL | 1 refills | Status: DC

## 2016-02-02 MED ORDER — AMLODIPINE BESYLATE 5 MG PO TABS: 5.0000 mg | ORAL_TABLET | Freq: Every day | ORAL | 1 refills | Status: DC

## 2016-02-02 MED ORDER — HYDROCHLOROTHIAZIDE 12.5 MG PO TABS: 12.5000 mg | ORAL_TABLET | Freq: Every day | ORAL | 1 refills | Status: DC

## 2016-02-02 NOTE — Telephone Encounter (Signed)
I reordered all of her medicines and did so individually. She still has a problem with the cause please let us know.

## 2016-02-02 NOTE — Telephone Encounter (Signed)
Pt states that olmesartan-amlodipine-hctz is $90 for a 30 day supply and she cannot afford this.  Pt would like to know if this medication can be changed to something less expensive.  Charyl Bigger, CMA

## 2016-02-02 NOTE — Telephone Encounter (Signed)
Pt informed of changes in RXs and to call if cost is still a concern.  Pt expressed understanding and is agreeable.  Charyl Bigger, CMA

## 2016-02-06 NOTE — Telephone Encounter (Signed)
Hey just wanted to remind you that I see a message from [id number] in the chart on her.  I know we discussed it in person and you were going to send in the indiviudual meds, and it appears you have.  Alsom, I do not see a message in my inbox at all but says you routed it to me.( ?)       So, I just wanted to circle back around with you to make sure this was addressed and completed, as I do not see the documentation to show it was.   Thanks. Dr Jenetta Downer

## 2016-02-06 NOTE — Assessment & Plan Note (Signed)
-  We'll change patient's blood pressure medicine and take her off the beta blocker. - Importance of labs in the near future discussed with patient. Will get our routine ones for elderly. - Ambulatory blood pressure monitoring discussed with patient, bring in log next OV

## 2016-02-07 NOTE — Telephone Encounter (Signed)
In reviewing the patient's medication and message routing history, you actually completed the refills yourself and there is no history of me routing the message back to you.  Sorry, but I do not know how it would have shown back up in your inbox.

## 2016-02-16 ENCOUNTER — Encounter: Payer: Self-pay | Admitting: Family Medicine

## 2016-02-16 ENCOUNTER — Ambulatory Visit (INDEPENDENT_AMBULATORY_CARE_PROVIDER_SITE_OTHER): Payer: Medicare Other | Admitting: Family Medicine

## 2016-02-16 VITALS — BP 129/79 | HR 52 | Temp 98.1°F | Ht 66.0 in | Wt 240.2 lb

## 2016-02-16 DIAGNOSIS — J00 Acute nasopharyngitis [common cold]: Secondary | ICD-10-CM

## 2016-02-16 DIAGNOSIS — J029 Acute pharyngitis, unspecified: Secondary | ICD-10-CM | POA: Diagnosis not present

## 2016-02-16 LAB — POCT RAPID STREP A (OFFICE): Rapid Strep A Screen: NEGATIVE

## 2016-02-16 MED ORDER — DM-GUAIFENESIN ER 30-600 MG PO TB12: 1.0000 | ORAL_TABLET | Freq: Two times a day (BID) | ORAL | 0 refills | Status: DC

## 2016-02-16 MED ORDER — FLUTICASONE PROPIONATE 50 MCG/ACT NA SUSP: 2.0000 | Freq: Every day | NASAL | 6 refills | Status: DC

## 2016-02-16 NOTE — Progress Notes (Signed)
Assessment and plan:  1. Acute pharyngitis, unspecified etiology   2. Acute nasopharyngitis    - Viral vs Allergic vs Bacterial causes for pt's symptoms reveiwed.    - Supportive care and various OTC medications discussed in addition to any prescribed. - Call or RTC if new symptoms, or if no improvement or worse over next couple days.   - Will consider ABX at that time if sx continue past 5-7 days and worsening.   - If you are not starting to feel better in the next 48 hours, by Friday morning, give [place] a call, and we will call you and antibiotics-likely amoxicillin for your cold.   -- Patient states amoxicillin has worked very well for her I would prescribe 875 twice a day for 10 days if need be in the next couple days.   - since her blood pressure medicines- taking them all 3 at once -makes her feel a little sick on her stomach, I want you to take the hydrochlorothiazide first thing in the morning after breakfast, then the amlodipine after lunchtime, and then your losartan after dinner.  New Prescriptions   DEXTROMETHORPHAN-GUAIFENESIN (MUCINEX DM) 30-600 MG 12HR TABLET    Take 1 tablet by mouth 2 (two) times daily.   FLUTICASONE (FLONASE) 50 MCG/ACT NASAL SPRAY    Place 2 sprays into both nostrils daily.    Modified Medications   No medications on file    Discontinued Medications   No medications on file    Anticipatory guidance and routine counseling done re: condition, txmnt options and need for follow up. All questions of patient's were answered.   Gross side effects, risk and benefits, and alternatives of medications discussed with patient.  Patient is aware that all medications have potential side effects and we are unable to predict every sideeffect or drug-drug interaction that may occur.  Expresses verbal understanding and consents to current therapy plan and treatment regiment.  Return if symptoms worsen or fail to improve.  Please see AVS handed out to patient at the  end of our visit for additional patient instructions/ counseling done pertaining to today's office visit.  Note: This document was prepared using Dragon voice recognition software and may include unintentional dictation errors.    Subjective:    CC: URI symptoms  HPI:  Pt presents with URI sx for 1.5 days days.      Coughing- mostly at night when laying down- non-productive,  Causing ST, tight in chest- just lying down at nihgt feels diff to breath, no wheeze and nose dripping. Felt like a bad cold.   Fevers 102-> orally 2 nights ago, No face pain or ear pain, No N/V/D,  No Rash. NO urinary sx at all, no inc freq or urgency, no abd pain.     Cold meds, tylenol, taken anything for sx.    Overall getting better.     Patient Active Problem List   Diagnosis Date Noted  . TIA (transient ischemic attack) 01/31/2016  . Vaginal mass 07/23/2014  . OA (osteoarthritis) of knee 05/23/2013  . OAB (overactive bladder) 04/16/2013  . Acute bronchitis 04/16/2013  . Post-nasal drip 05/30/2012  . Finger injury 05/30/2012  . Myalgia and myositis 01/30/2012  . Lung nodule seen on imaging study 01/23/2012  . Flank pain 01/22/2012  . Radicular low back pain 01/22/2012  . Rosacea 06/25/2011  . HTN (hypertension) 06/15/2011  . Back pain 06/15/2011  . Urinary frequency 06/15/2011    Past medical history, Surgical history,  Family history reviewed and noted below, Social history, Allergies, and Medications have been entered into the medical record, reviewed and changed as needed.   Allergies  Allergen Reactions  . Other Other (See Comments)    IV dye, heart stopped    Review of Systems  Constitutional: Positive for diaphoresis, fever and malaise/fatigue. Negative for chills and weight loss.  HENT: Positive for congestion and sore throat. Negative for tinnitus.   Eyes: Negative.  Negative for blurred vision, double vision and photophobia.  Respiratory: Positive for cough, sputum production  and wheezing.   Cardiovascular: Positive for chest pain. Negative for palpitations.  Gastrointestinal: Negative.  Negative for blood in stool, diarrhea, nausea and vomiting.  Genitourinary: Negative.  Negative for dysuria, frequency and urgency.  Musculoskeletal: Negative.  Negative for joint pain and myalgias.  Skin: Negative.  Negative for itching and rash.  Neurological: Positive for weakness. Negative for dizziness, focal weakness and headaches.  Endo/Heme/Allergies: Negative.  Negative for environmental allergies and polydipsia. Does not bruise/bleed easily.  Psychiatric/Behavioral: Negative.  Negative for depression and memory loss. The patient is not nervous/anxious and does not have insomnia.     Objective:   Blood pressure 129/79, pulse (!) 52, temperature 98.1 F (36.7 C), temperature source Oral, height 5\' 6"  (1.676 m), weight 240 lb 3.2 oz (109 kg). Body mass index is 38.77 kg/m.  General: Well Developed, well nourished, appropriate for stated age.  Neuro: Alert and oriented x3, extra-ocular muscles intact, sensation grossly intact.  HEENT: Normocephalic, atraumatic, pupils equal round reactive to light, neck supple, no masses, no painful lymphadenopathy, TM's intact B/L, no acute findings. Nares- patent, clear d/c, OP- clear, mild erythema, No TTP sinuses Skin: Warm and dry, no gross rash. Cardiac: RRR, S1 S2,  no murmurs rubs or gallops.  Respiratory: ECTA B/L and A/P, Not using accessory muscles, speaking in full sentences- unlabored. Vascular:  No gross lower ext edema, cap RF less 2 sec. Psych: No HI/SI, judgement and insight good, Euthymic mood. Full Affect.

## 2016-02-16 NOTE — Patient Instructions (Addendum)
You have a viral infection that should resolve on its own over time.    Symptoms usually last 3-7 days but can stretch out to 2-3 weeks.  Your symptoms should not worsen after 7-10 days and if they do, please notify our office  You can use over-the-counter afrin nasal spray for up to 3 days (NO longer than that) which will help with nasal drainage/congestion short term.   Also, sterile saline nasal rinses, such as Milta Deiters med sinus rinse, can be very helpful and should be done twice daily.  I would also like you to use an over the counter cold and flu medication such as Tylenol Severe Cold and Sinus or Dayquil/ Nyquil and the like which will help with cough, congestion, pain, and fevers/chills etc.  Unfortunately, antibiotics are not helpful for viral infections.  Wash your hands frequently, as you did not want to get those around you sick as well. Never sneeze or cough on others.  Drink plenty of fluids and stay hydrated, especially if you are running fevers.  We don't know why, but chicken soup also helps, try it! :)  If you are not starting to feel better in the next 48 hours, by Friday morning, give [place] a call, and we will call you and antibiotics-likely amoxicillin for your cold.     since her blood pressure medicines, taking them all 3 at once, make sure feel little sick on her stomach, I want you to take the hydrochlorothiazide first thing in the morning after breakfast, then the amlodipine after lunchtime, and then your losartan after dinner.

## 2016-02-22 ENCOUNTER — Encounter: Payer: Medicare Other | Admitting: Internal Medicine

## 2016-03-17 ENCOUNTER — Ambulatory Visit (INDEPENDENT_AMBULATORY_CARE_PROVIDER_SITE_OTHER): Payer: Medicare Other | Admitting: Neurology

## 2016-03-17 ENCOUNTER — Encounter: Payer: Self-pay | Admitting: Neurology

## 2016-03-17 VITALS — Ht 65.0 in | Wt 245.0 lb

## 2016-03-17 DIAGNOSIS — R55 Syncope and collapse: Secondary | ICD-10-CM

## 2016-03-17 DIAGNOSIS — K219 Gastro-esophageal reflux disease without esophagitis: Secondary | ICD-10-CM

## 2016-03-17 NOTE — Progress Notes (Signed)
NEUROLOGY CONSULTATION NOTE  Kimberly Ferrell MRN: [id number] DOB: [birth year]  Referring provider: Dr. Raliegh Scarlet Primary care provider: Dr. Raliegh Scarlet  Reason for consult:  History of TIA  HISTORY OF PRESENT ILLNESS: Kimberly Ferrell is a 75 year old right-handed woman with hypertension and history of TIA who presents for dizziness and TIAs.  History obtained by patient, husband, PCP.  History is not clear.  She was admitted to the hospital in February 2008 after a syncopal episode.  She was at church when she suddenly felt hot and lightheaded.  She got up to go wait in the car when suddenly she saw "stars" and had tunnel vision.  She then passed out for a couple of seconds.  She reported left sided weakness, but she attributes it to having fell on her left side and it hurt.  She also had a headache, which she attributes to having hit her head when she fell.  CT of head was performed on 06/25/06 and was unremarkable.  MRI of brain with and without contrast was performed on 06/28/06, which was personally reviewed and revealed chronic small vessel ischemic changes but no acute stroke, bleed or mass lesion.   She reports two other episodes of loss of consciousness, similar to initial spell, however not associated with any unilateral weakness or headache.  She reports feeling hot, lightheaded, seeing stars/tunnel vision prior to passing out.  As per her husband, she passed out for a couple of seconds.  There was no seizure activity, urinary incontinence or tongue biting.  There was no postictal confusion.  Last event was at least over a year ago.  She had episode of altered awareness in on 12/15/03 following receiving IV dye.  MRI of brain was personally reviewed and was unremarkable.  MRA of head was personally reviewed and did not reveal basilar stenosis.  She does take ASA daily.  PAST MEDICAL HISTORY: Past Medical History:  Diagnosis Date  . Arthritis   . Chicken pox as child  . Colon polyps     . Complication of anesthesia    "hard to wake up"  . GERD (gastroesophageal reflux disease)    occasional  . Hemorrhoids    "sometimes"  . Hypertension   . Lung nodule seen on imaging study    noted 8/13- needs CT f/u in 1 year  . Mononucleosis   . Pneumonia    hx of  . Rosacea   . TIA (transient ischemic attack) 2007   from IV dye, no residual, passed out and was told by RN that it was TIA    PAST SURGICAL HISTORY: Past Surgical History:  Procedure Laterality Date  . ABDOMINAL HYSTERECTOMY  1993  . CHOLECYSTECTOMY  1992  . HEMORRHOID SURGERY  1960's  . 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 amLODipine (NORVASC) 5 MG tablet Take 1 tablet (5 mg total) by mouth daily. 90 tablet 1  . aspirin EC 81 MG tablet Take 1 tablet (81 mg total) by mouth daily. 90 tablet 3  . dextromethorphan-guaiFENesin (MUCINEX DM) 30-600 MG 12hr tablet Take 1 tablet by mouth 2 (two) times daily. 20 tablet 0  . fluticasone (FLONASE) 50 MCG/ACT nasal spray Place 2 sprays into both nostrils daily. 16 g 6  . hydrochlorothiazide (HYDRODIURIL)  12.5 MG tablet Take 1 tablet (12.5 mg total) by mouth daily. 90 tablet 1  . METRONIDAZOLE, TOPICAL, 0.75 % LOTN Apply topically as needed.    . valsartan (DIOVAN) 40 MG tablet Take 1 tablet (40 mg total) by mouth daily. 90 tablet 1   No current facility-administered medications on file prior to visit.     ALLERGIES: Allergies  Allergen Reactions  . Other Other (See Comments)    IV dye, heart stopped    FAMILY HISTORY: Family History  Problem Relation Age of Onset  . Cancer Mother 3    colon  . Sudden death Father 26    suicide  . Stroke Maternal Grandmother 78  . Hypertension Neg Hx   . Hyperlipidemia Neg Hx   .  Heart attack Neg Hx   . Diabetes Neg Hx     SOCIAL HISTORY: Social History   Social History  . Marital status: Married    Spouse name: N/A  . Number of children: N/A  . Years of education: N/A   Occupational History  . Not on file.   Social History Main Topics  . Smoking status: Never Smoker  . Smokeless tobacco: Never Used  . Alcohol use No  . Drug use: No  . Sexual activity: Not Currently   Other Topics Concern  . Not on file   Social History Narrative  . No narrative on file    REVIEW OF SYSTEMS: Constitutional: No fevers, chills, or sweats, no generalized fatigue, change in appetite Eyes: No visual changes, double vision, eye pain Ear, nose and throat: No hearing loss, ear pain, nasal congestion, sore throat Cardiovascular: No chest pain, palpitations Respiratory:  No shortness of breath at rest or with exertion, wheezes GastrointestinaI: No nausea, vomiting, diarrhea, abdominal pain, fecal incontinence Genitourinary:  No dysuria, urinary retention or frequency Musculoskeletal:  No neck pain, back pain Integumentary: No rash, pruritus, skin lesions Neurological: as above Psychiatric: No depression, insomnia, anxiety Endocrine: No palpitations, fatigue, diaphoresis, mood swings, change in appetite, change in weight, increased thirst Hematologic/Lymphatic:  No purpura, petechiae. Allergic/Immunologic: no itchy/runny eyes, nasal congestion, recent allergic reactions, rashes  PHYSICAL EXAM: There were no vitals filed for this visit. General: No acute distress.  Patient appears well-groomed.  Head:  Normocephalic/atraumatic Eyes:  fundi examined but not visualized Neck: supple, no paraspinal tenderness, full range of motion Back: No paraspinal tenderness Heart: regular rate and rhythm Lungs: Clear to auscultation bilaterally. Vascular: No carotid bruits. Neurological Exam: Mental status: alert and oriented to person, place, and time, recent and remote memory  intact, fund of knowledge intact, attention and concentration intact, speech fluent and not dysarthric, language intact. Cranial nerves: CN I: not tested CN II: pupils equal, round and reactive to light, visual fields intact CN III, IV, VI:  full range of motion, no nystagmus, no ptosis CN V: facial sensation intact CN VII: upper and lower face symmetric CN VIII: hearing intact CN IX, X: gag intact, uvula midline CN XI: sternocleidomastoid and trapezius muscles intact CN XII: tongue midline Bulk & Tone: normal, no fasciculations. Motor:  5/5 throughout  Sensation: temperature and vibration sensation intact. Deep Tendon Reflexes:  2+ throughout, toes downgoing.  Finger to nose testing:  Without dysmetria.  Heel to shin:  Without dysmetria.  Gait:  Antalgic gait due to right knee pain.  Able to turn and tandem walk. Romberg negative.  IMPRESSION: Syncope.  No recent events.  The first event was associated with left sided weakness, which may possibly  be TIA, however she reports having fell on left side and was hurting.  She has had 2 other spells consistent with syncope but without any unilateral weakness.  These events are not consistent with TIA or seizure.  She has not had any recent spell.  She is already on ASA anyway.  If further workup warranted, then consider cardiac consultation.  Thank you for allowing me to take part in the care of this patient.  Metta Clines, DO  CC:  Mellody Dance, DO

## 2016-03-17 NOTE — Patient Instructions (Signed)
I think you simply have passed out on 2 or 3 occasions.  I don't think it is neurologic.  I don't think you had a stroke or seizure.  If your doctor feels it needs to be further evaluated, I would recommend seeing a cardiologist.

## 2016-03-22 ENCOUNTER — Ambulatory Visit (INDEPENDENT_AMBULATORY_CARE_PROVIDER_SITE_OTHER): Payer: Medicare Other

## 2016-03-22 DIAGNOSIS — Z23 Encounter for immunization: Secondary | ICD-10-CM | POA: Diagnosis not present

## 2016-04-12 ENCOUNTER — Ambulatory Visit: Payer: Medicare Other | Admitting: Neurology

## 2016-04-14 DIAGNOSIS — J209 Acute bronchitis, unspecified: Secondary | ICD-10-CM | POA: Diagnosis not present

## 2016-04-27 DIAGNOSIS — M1711 Unilateral primary osteoarthritis, right knee: Secondary | ICD-10-CM | POA: Diagnosis not present

## 2016-05-01 ENCOUNTER — Ambulatory Visit (INDEPENDENT_AMBULATORY_CARE_PROVIDER_SITE_OTHER): Payer: Medicare Other | Admitting: Family Medicine

## 2016-05-01 ENCOUNTER — Encounter: Payer: Self-pay | Admitting: Family Medicine

## 2016-05-01 ENCOUNTER — Other Ambulatory Visit: Payer: Self-pay | Admitting: Orthopedic Surgery

## 2016-05-01 VITALS — Ht 65.0 in | Wt 246.6 lb

## 2016-05-01 DIAGNOSIS — M545 Low back pain: Secondary | ICD-10-CM | POA: Diagnosis not present

## 2016-05-01 DIAGNOSIS — R350 Frequency of micturition: Secondary | ICD-10-CM | POA: Diagnosis not present

## 2016-05-01 DIAGNOSIS — R112 Nausea with vomiting, unspecified: Secondary | ICD-10-CM | POA: Diagnosis not present

## 2016-05-01 DIAGNOSIS — M1711 Unilateral primary osteoarthritis, right knee: Secondary | ICD-10-CM

## 2016-05-01 LAB — POCT URINALYSIS DIPSTICK
Bilirubin, UA: NEGATIVE
Glucose, UA: NEGATIVE
Ketones, UA: NEGATIVE
LEUKOCYTES UA: NEGATIVE
Nitrite, UA: NEGATIVE
PROTEIN UA: NEGATIVE
Spec Grav, UA: 1.025
Urobilinogen, UA: 0.2
pH, UA: 5.5

## 2016-05-01 NOTE — Progress Notes (Signed)
Subjective:    HPI: Kimberly Ferrell is a 75 y.o. female who presents to [place] at [facility] today for c/o frequency and back pain. B/L lower back- both sides.   Sx for 5-6 days.  Now no worse, no better , last night was last time she had N/V    C/O: no dysuria y   increased frequency Yes    increased urgency Yes    malodorous urination Yes    Nausea 4days ago or so had chills.   Fever/ Chills Yes    Back Pain No    vaginal/penile discharge No    prior h/o STI Monogamous currently; partner w/o STI Yes for her bronchitis     ABX usage past 30 d   Went to Dr Elmyra Ricks recently for her R knee pain.   Urinalysis    Component Value Date/Time   COLORURINE YELLOW 04/06/2014 [place] 04/06/2014 1605   LABSPEC 1.015 07/23/2014 1604   PHURINE 6.0 07/23/2014 1604   PHURINE 6.0 04/06/2014 1605   GLUCOSEU Negative 07/23/2014 1604   HGBUR Large 07/23/2014 1604   HGBUR NEG 04/06/2014 1605   BILIRUBINUR negative 05/01/2016 1656   BILIRUBINUR Negative 07/23/2014 1604   KETONESUR Negative 07/23/2014 1604   KETONESUR NEG 04/06/2014 1605   PROTEINUR negative 05/01/2016 1656   PROTEINUR Negative 07/23/2014 1604   PROTEINUR NEG 04/06/2014 1605   UROBILINOGEN 0.2 05/01/2016 1656   UROBILINOGEN 0.2 07/23/2014 1604   NITRITE negative 05/01/2016 1656   NITRITE Negative 07/23/2014 1604   NITRITE NEG 04/06/2014 1605   LEUKOCYTESUR Negative 05/01/2016 1656   LEUKOCYTESUR Negative 07/23/2014 1604    Wt Readings from Last 3 Encounters:  05/01/16 246 lb 9.6 oz (111.9 kg)  03/17/16 245 lb (111.1 kg)  02/16/16 240 lb 3.2 oz (109 kg)   BP Readings from Last 3 Encounters:  02/16/16 129/79  01/31/16 (!) 153/79  07/23/14 (!) 161/75   Pulse Readings from Last 3 Encounters:  02/16/16 (!) 52  01/31/16 61  07/23/14 67   BMI Readings from Last 3 Encounters:  05/01/16 41.04 kg/m  03/17/16 40.77 kg/m  02/16/16 38.77 kg/m     Patient Active  Problem List   Diagnosis Date Noted  . TIA (transient ischemic attack) 01/31/2016  . Vaginal mass 07/23/2014  . OA (osteoarthritis) of knee 05/23/2013  . OAB (overactive bladder) 04/16/2013  . Acute bronchitis 04/16/2013  . Post-nasal drip 05/30/2012  . Finger injury 05/30/2012  . Myalgia and myositis 01/30/2012  . Lung nodule seen on imaging study 01/23/2012  . Flank pain 01/22/2012  . Radicular low back pain 01/22/2012  . Rosacea 06/25/2011  . HTN (hypertension) 06/15/2011  . Back pain 06/15/2011  . Urinary frequency 06/15/2011    Past Surgical History:  Procedure Laterality Date  . ABDOMINAL HYSTERECTOMY  1993  . CHOLECYSTECTOMY  1992  . HEMORRHOID SURGERY  1960's  . TOTAL KNEE ARTHROPLASTY Left 05/23/2013   Procedure: LEFT TOTAL KNEE ARTHROPLASTY;  Surgeon: Gearlean Alf, MD;  Location: [facility];  Service: Orthopedics;  Laterality: Left;    Family History  Problem Relation Age of Onset  . Cancer Mother 38    colon  . Sudden death Father 46    suicide  . Stroke Maternal Grandmother 78  . Hypertension Neg Hx   . Hyperlipidemia Neg Hx   . Heart attack Neg Hx   . Diabetes Neg Hx     History  Drug  Use No  ,  History  Alcohol Use No  ,  History  Smoking Status  . Never Smoker  Smokeless Tobacco  . Never Used  ,  History  Sexual Activity  . Sexual activity: Not Currently    Patient's Medications  New Prescriptions   No medications on file  Previous Medications   AMLODIPINE (NORVASC) 5 MG TABLET    Take 1 tablet (5 mg total) by mouth daily.   ASPIRIN EC 81 MG TABLET    Take 1 tablet (81 mg total) by mouth daily.   DEXTROMETHORPHAN-GUAIFENESIN (MUCINEX DM) 30-600 MG 12HR TABLET    Take 1 tablet by mouth 2 (two) times daily.   HYDROCHLOROTHIAZIDE (HYDRODIURIL) 12.5 MG TABLET    Take 1 tablet (12.5 mg total) by mouth daily.   METRONIDAZOLE, TOPICAL, 0.75 % LOTN    Apply topically as needed.   VALSARTAN (DIOVAN) 40 MG TABLET    Take 1 tablet (40 mg total) by  mouth daily.  Modified Medications   No medications on file  Discontinued Medications   FLUTICASONE (FLONASE) 50 MCG/ACT NASAL SPRAY    Place 2 sprays into both nostrils daily.     Allergies  Allergen Reactions  . Other Other (See Comments)    IV dye, heart stopped    Current Meds  Medication Sig  . amLODipine (NORVASC) 5 MG tablet Take 1 tablet (5 mg total) by mouth daily.  Marland Kitchen aspirin EC 81 MG tablet Take 1 tablet (81 mg total) by mouth daily.  Marland Kitchen dextromethorphan-guaiFENesin (MUCINEX DM) 30-600 MG 12hr tablet Take 1 tablet by mouth 2 (two) times daily.  . hydrochlorothiazide (HYDRODIURIL) 12.5 MG tablet Take 1 tablet (12.5 mg total) by mouth daily.  Marland Kitchen METRONIDAZOLE, TOPICAL, 0.75 % LOTN Apply topically as needed.  . valsartan (DIOVAN) 40 MG tablet Take 1 tablet (40 mg total) by mouth daily.  . [DISCONTINUED] fluticasone (FLONASE) 50 MCG/ACT nasal spray Place 2 sprays into both nostrils daily.    Review of Systems  Constitutional: Positive for malaise/fatigue. Negative for chills and fever.  Respiratory: Negative for shortness of breath.   Gastrointestinal: Negative for abdominal pain, diarrhea, nausea and vomiting.  Genitourinary: Positive for frequency and urgency. Negative for dysuria, flank pain and hematuria.       No Genital discharge/ complaints  Musculoskeletal: Positive for back pain.  Skin: Negative for rash.  Neurological: Negative for dizziness, focal weakness and loss of consciousness.  Endo/Heme/Allergies: Negative for polydipsia.  Psychiatric/Behavioral:       Mood- stable    Objective:  Height 5\' 5"  (1.651 m), weight 246 lb 9.6 oz (111.9 kg). Body mass index is 41.04 kg/m.  General: Well Developed, well nourished, and in no acute distress.  HEENT: Normocephalic, atraumatic Skin: Warm and dry, cap RF less 2 sec, good turgor CV: +S1, S2 Respiratory: ECTA B/L; speaking in full sentences, no conversational dyspnea Abd: Soft, NT, ND, No G/R/R, no SPT, No flank  pain, midline back pain- L3-5.  NeuroM-Sk: Ambulates w/o assistance, moves * 4 Psych: A and O *3   Impression and Recommendations:    1. Urinary frequency   2. Midline low back pain, unspecified chronicity, with sciatica presence unspecified   3. Nausea and vomiting, intractability of vomiting not specified, unspecified vomiting type    1. Urine analysis reassuring today in office as well as P.E.- no UTI apprec 2. Expectant mgt d/c pt and red flags to watch for d/c her.   The patient was counseled, risk factors  were discussed, anticipatory guidance given.  Gross side effects, risk and benefits, and alternatives of medications discussed with patient.  Patient is aware that all medications have potential side effects and we are unable to predict every side effect or drug-drug interaction that may occur.  Expresses verbal understanding and consents to current therapy plan and treatment regimen.   Orders Placed This Encounter  Procedures  . POCT urinalysis dipstick    New Prescriptions   No medications on file    Modified Medications   No medications on file    Discontinued Medications   FLUTICASONE (FLONASE) 50 MCG/ACT NASAL SPRAY    Place 2 sprays into both nostrils daily.    Return if symptoms worsen or fail to improve.

## 2016-05-01 NOTE — Patient Instructions (Signed)
AZO prn is dysuria dev

## 2016-05-22 ENCOUNTER — Ambulatory Visit
Admission: RE | Admit: 2016-05-22 | Discharge: 2016-05-22 | Disposition: A | Discharge status: Home / Self Care | Payer: Medicare Other | Source: Ambulatory Visit | Attending: Orthopedic Surgery | Admitting: Orthopedic Surgery

## 2016-05-22 ENCOUNTER — Other Ambulatory Visit: Payer: Medicare Other

## 2016-05-22 DIAGNOSIS — M179 Osteoarthritis of knee, unspecified: Secondary | ICD-10-CM | POA: Diagnosis not present

## 2016-05-22 DIAGNOSIS — M1711 Unilateral primary osteoarthritis, right knee: Secondary | ICD-10-CM

## 2016-07-07 ENCOUNTER — Other Ambulatory Visit: Payer: Self-pay | Admitting: Physician Assistant

## 2016-07-07 DIAGNOSIS — Z1231 Encounter for screening mammogram for malignant neoplasm of breast: Secondary | ICD-10-CM

## 2016-07-14 ENCOUNTER — Ambulatory Visit: Payer: Self-pay | Admitting: Orthopedic Surgery

## 2016-07-14 NOTE — H&P (Signed)
Kimberly Ferrell DOB: 12-Dec-1940 Married / Language: English / Race: White Female Date of Admission:  08/09/2016 CC:  Right Knee Pain History of Present Illness  The patient is a 76 year old female who comes in for a preoperative History and Physical. The patient is scheduled for a right unicompartmental knee arthroplasty to be performed by Dr. Dione Plover. Aluisio, MD at [facility] on 08-09-2016. The patient is a 76 year old female who presented for follow up of their knee. The patient is being followed for their right knee pain and osteoarthritis. They are now several month(s) out from cortisone injection. Symptoms reported include: pain, pain at night, aching, throbbing, instability and difficulty ambulating. The patient feels that they are doing poorly and report their pain level to be moderate to severe. Current treatment includes: ice and heat. The following medication has been used for pain control: Tylenol. The patient has reported improvement of their symptoms with: Cortisone injections. She states the last injection helped for about a month. She completed a round of viscosupplementation last fall which did not get her any relief. She reports that she is having pain at all times, even at rest. Unfortunately, the right knee is getting progressively worse. All of her pain is on the medial side of the knee. She is not having any swelling. She does not have any lateral anterior pain. She feels like it is getting worse. It started to limit what she can and cannot do. The cortisone and viscosupplements have not provided any lasting benefit. She is ready to get the kne replaced. It is felt that she would benefit from undergoing a unicompartmental knee replacement. They have been treated conservatively in the past for the above stated problem and despite conservative measures, they continue to have progressive pain and severe functional limitations and dysfunction. They have failed non-operative  management including home exercise, medications, and injections. It is felt that they would benefit from undergoing partial knee joint replacement. Risks and benefits of the procedure have been discussed with the patient and they elect to proceed with surgery. There are no active contraindications to surgery such as ongoing infection or rapidly progressive neurological disease.  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 mother  Social History Marital status  married Living situation  live with spouse Illicit drug use  no Tobacco use  Never smoker. never smoker Pain Contract  no Number of flights of stairs before winded  greater than 5 Current work status  retired Children  2 Alcohol use  never consumed alcohol Exercise  Exercises rarely; does other Drug/Alcohol Rehab (Previously)  no Drug/Alcohol Rehab (Currently)  no Advance Directives  Living Will, Healthcare POA  Medication History Bisoprolol-Hydrochlorothiazide (5-6.25MG  Tablet, Oral) Active. Tylenol Extra Strength (500MG  Tablet, Oral) Active. NexIUM (20MG  Capsule DR, Oral) Active.   Past Surgical History Gallbladder Surgery  laporoscopic Hysterectomy  complete (non-cancerous)   Review of Systems General Not Present- Chills, Fatigue, Fever, Memory Loss, Night Sweats, Weight Gain and Weight Loss. Skin Not Present- Eczema, Hives, Itching, Lesions and Rash. HEENT Not Present- Dentures, Double Vision, Headache, Hearing Loss, Tinnitus  and Visual  Loss. Respiratory Not Present- Allergies, Chronic Cough, Coughing up blood, Shortness of breath at rest and Shortness of breath with exertion. Cardiovascular Not Present- Chest Pain, Difficulty Breathing Lying Down, Murmur, Palpitations, Racing/skipping heartbeats and Swelling. Gastrointestinal Not Present- Abdominal Pain, Bloody Stool, Constipation, Diarrhea, Difficulty Swallowing, Heartburn, Jaundice, Loss of appetitie, Nausea and Vomiting. Female Genitourinary Present- Urinating at Night. Not Present- Blood in Urine, Discharge, Flank Pain, Incontinence, Painful Urination, Urgency, Urinary frequency, Urinary Retention and Weak urinary stream. Musculoskeletal Present- Joint Pain. Not Present- Back Pain, Joint Swelling, Morning Stiffness, Muscle Pain, Muscle Weakness and Spasms. Neurological Not Present- Blackout spells, Difficulty with balance, Dizziness, Paralysis, Tremor and Weakness. Psychiatric Not Present- Insomnia.  Vitals Weight: 240 lb Height: 66in Weight was reported by patient. Height was reported by patient. Body Surface Area: 2.16 m Body Mass Index: 38.74 kg/m  Pulse: 84 (Regular)  BP: 148/78 (Sitting, Right Arm, Standard)  Physical Exam  General Mental Status -Alert, cooperative and good historian. General Appearance-pleasant, Not in acute distress. Orientation-Oriented X3. Build & Nutrition-Well nourished and Well developed.  Head and Neck Head-normocephalic, atraumatic . Neck Global Assessment - supple, no bruit auscultated on the right, no bruit auscultated on the left.  Eye Vision-Wears corrective lenses. Pupil - Bilateral-Regular and Round. Motion - Bilateral-EOMI.  ENMT Note: upper denture plate   Chest and Lung Exam Auscultation Breath sounds - clear at anterior chest wall and clear at posterior chest wall. Adventitious sounds - No Adventitious sounds.  Cardiovascular Auscultation Rhythm - Regular rate and rhythm. Heart  Sounds - S1 WNL and S2 WNL. Murmurs & Other Heart Sounds - Auscultation of the heart reveals - No Murmurs.  Abdomen Inspection Contour - Generalized moderate distention. Palpation/Percussion Tenderness - Abdomen is non-tender to palpation. Rigidity (guarding) - Abdomen is soft. Auscultation Auscultation of the abdomen reveals - Bowel sounds normal.  Female Genitourinary Note: Not done, not pertinent to present illness   Musculoskeletal Note: She is alert and oriented, no apparent distress. Her right knee shows no effusion. She does have slight varus. Her range is bout 5 to 130. There is minimal crepitus on range of motion. She is very tender medially. There is no lateral tenderness or instability noted.  Her radiographs from last visit are reviewed and she has got bone on bone in the medial compartment, but does not have any tibial subluxation or any lateral patellofemoral involvement.  Assessment & Plan Primary osteoarthritis of one knee, right (Principal Diagnosis) (M17.11) Status post total left knee replacement [id number])  Note:Surgical Plans: Right Total Knee Replacement  Disposition: Home with Husband  PCP: Dr. Mellody Dance - pending at time of H&P  IV TXA  Anesthesia Issues: None  Patient was instructed on what medications to stop prior to surgery.  Signed electronically by Ok Edwards, III PA-C

## 2016-07-14 NOTE — H&P (Signed)
Kimberly Ferrell DOB: 06/26/1940 Married / Language: English / Race: White Female Date of Admission:  08/09/2016 CC:  Right Knee Pain History of Present Illness  The patient is a 76 year old female who comes in for a preoperative History and Physical. The patient is scheduled for a right unicompartmental knee arthroplasty to be performed by Dr. Dione Plover. Aluisio, MD at [facility] on 08-09-2016. The patient is a 76 year old female who presented for follow up of their knee. The patient is being followed for their right knee pain and osteoarthritis. They are now several month(s) out from cortisone injection. Symptoms reported include: pain, pain at night, aching, throbbing, instability and difficulty ambulating. The patient feels that they are doing poorly and report their pain level to be moderate to severe. Current treatment includes: ice and heat. The following medication has been used for pain control: Tylenol. The patient has reported improvement of their symptoms with: Cortisone injections. She states the last injection helped for about a month. She completed a round of viscosupplementation last fall which did not get her any relief. She reports that she is having pain at all times, even at rest. Unfortunately, the right knee is getting progressively worse. All of her pain is on the medial side of the knee. She is not having any swelling. She does not have any lateral anterior pain. She feels like it is getting worse. It started to limit what she can and cannot do. The cortisone and viscosupplements have not provided any lasting benefit. She is ready to get the kne replaced. It is felt that she would benefit from undergoing a unicompartmental knee replacement. They have been treated conservatively in the past for the above stated problem and despite conservative measures, they continue to have progressive pain and severe functional limitations and dysfunction. They have failed non-operative  management including home exercise, medications, and injections. It is felt that they would benefit from undergoing partial knee joint replacement. Risks and benefits of the procedure have been discussed with the patient and they elect to proceed with surgery. There are no active contraindications to surgery such as ongoing infection or rapidly progressive neurological disease.  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 mother  Social History Marital status  married Living situation  live with spouse Illicit drug use  no Tobacco use  Never smoker. never smoker Pain Contract  no Number of flights of stairs before winded  greater than 5 Current work status  retired Children  2 Alcohol use  never consumed alcohol Exercise  Exercises rarely; does other Drug/Alcohol Rehab (Previously)  no Drug/Alcohol Rehab (Currently)  no Advance Directives  Living Will, Healthcare POA  Medication History Bisoprolol-Hydrochlorothiazide (5-6.25MG  Tablet, Oral) Active. Tylenol Extra Strength (500MG  Tablet, Oral) Active. NexIUM (20MG  Capsule DR, Oral) Active.   Past Surgical History Gallbladder Surgery  laporoscopic Hysterectomy  complete (non-cancerous)   Review of Systems General Not Present- Chills, Fatigue, Fever, Memory Loss, Night Sweats, Weight Gain and Weight Loss. Skin Not Present- Eczema, Hives, Itching, Lesions and Rash. HEENT Not Present- Dentures, Double Vision, Headache, Hearing Loss, Tinnitus  and Visual  Loss. Respiratory Not Present- Allergies, Chronic Cough, Coughing up blood, Shortness of breath at rest and Shortness of breath with exertion. Cardiovascular Not Present- Chest Pain, Difficulty Breathing Lying Down, Murmur, Palpitations, Racing/skipping heartbeats and Swelling. Gastrointestinal Not Present- Abdominal Pain, Bloody Stool, Constipation, Diarrhea, Difficulty Swallowing, Heartburn, Jaundice, Loss of appetitie, Nausea and Vomiting. Female Genitourinary Present- Urinating at Night. Not Present- Blood in Urine, Discharge, Flank Pain, Incontinence, Painful Urination, Urgency, Urinary frequency, Urinary Retention and Weak urinary stream. Musculoskeletal Present- Joint Pain. Not Present- Back Pain, Joint Swelling, Morning Stiffness, Muscle Pain, Muscle Weakness and Spasms. Neurological Not Present- Blackout spells, Difficulty with balance, Dizziness, Paralysis, Tremor and Weakness. Psychiatric Not Present- Insomnia.  Vitals Weight: 240 lb Height: 66in Weight was reported by patient. Height was reported by patient. Body Surface Area: 2.16 m Body Mass Index: 38.74 kg/m  Pulse: 84 (Regular)  BP: 148/78 (Sitting, Right Arm, Standard)  Physical Exam  General Mental Status -Alert, cooperative and good historian. General Appearance-pleasant, Not in acute distress. Orientation-Oriented X3. Build & Nutrition-Well nourished and Well developed.  Head and Neck Head-normocephalic, atraumatic . Neck Global Assessment - supple, no bruit auscultated on the right, no bruit auscultated on the left.  Eye Vision-Wears corrective lenses. Pupil - Bilateral-Regular and Round. Motion - Bilateral-EOMI.  ENMT Note: upper denture plate   Chest and Lung Exam Auscultation Breath sounds - clear at anterior chest wall and clear at posterior chest wall. Adventitious sounds - No Adventitious sounds.  Cardiovascular Auscultation Rhythm - Regular rate and rhythm.  Heart Sounds - S1 WNL and S2 WNL. Murmurs & Other Heart Sounds - Auscultation of the heart reveals - No Murmurs.  Abdomen Inspection Contour - Generalized moderate distention. Palpation/Percussion Tenderness - Abdomen is non-tender to palpation. Rigidity (guarding) - Abdomen is soft. Auscultation Auscultation of the abdomen reveals - Bowel sounds normal.  Female Genitourinary Note: Not done, not pertinent to present illness   Musculoskeletal Note: She is alert and oriented, no apparent distress. Her right knee shows no effusion. She does have slight varus. Her range is bout 5 to 130. There is minimal crepitus on range of motion. She is very tender medially. There is no lateral tenderness or instability noted.  Her radiographs from last visit are reviewed and she has got bone on bone in the medial compartment, but does not have any tibial subluxation or any lateral patellofemoral involvement.  Assessment & Plan Primary osteoarthritis of one knee, right (Principal Diagnosis) (M17.11) Status post total left knee replacement [id number])  Note:Surgical Plans: Right Total Knee Replacement  Disposition: Home with Husband  PCP: Dr. Mellody Dance - pending at time of H&P  IV TXA  Anesthesia Issues: None  Patient was instructed on what medications to stop prior to surgery.  Signed electronically by Ok Edwards, III PA-C

## 2016-07-18 ENCOUNTER — Ambulatory Visit: Payer: Self-pay | Admitting: Orthopedic Surgery

## 2016-07-31 ENCOUNTER — Ambulatory Visit (INDEPENDENT_AMBULATORY_CARE_PROVIDER_SITE_OTHER): Payer: Medicare Other | Admitting: Adult Health

## 2016-07-31 ENCOUNTER — Telehealth: Payer: Self-pay

## 2016-07-31 ENCOUNTER — Encounter: Payer: Self-pay | Admitting: Adult Health

## 2016-07-31 VITALS — BP 167/77 | HR 73 | Ht 65.0 in | Wt 256.4 lb

## 2016-07-31 DIAGNOSIS — I10 Essential (primary) hypertension: Secondary | ICD-10-CM | POA: Diagnosis not present

## 2016-07-31 DIAGNOSIS — Z01818 Encounter for other preprocedural examination: Secondary | ICD-10-CM | POA: Diagnosis not present

## 2016-07-31 MED ORDER — BISOPROLOL-HYDROCHLOROTHIAZIDE 5-6.25 MG PO TABS: 1.0000 | ORAL_TABLET | Freq: Every day | ORAL | 1 refills | Status: DC

## 2016-07-31 NOTE — Progress Notes (Signed)
Subjective:    Patient ID: Kimberly Ferrell, female    DOB: [birth year], 76 y.o.   MRN: [id number]  HPI:  Kimberly Ferrell present for medical clearance for right unicompartmental replacement surgery scheduled for 3/128/2018.  She had complete left knee replacement [id number]/5643, without complications and she did completed course of PT as directed.  She denies CP/dyspnea at rest/dizziness/palpitations.  Right knee pain is constant, described as "pulling" and rated at 8/10.  Sitting/rest decreases pain and prolonged standing/walking increases pain.  She reports 90-100 wt. Gain in the last two years and would like to loss the weight.  She reports that she and her husband were walking 20mins daily until about 4 months ago (stopped fue to right knee pain).   Upon medication review discovered that she stopped her anti-hypertensives that was started in Sept 2017: Valsartan 40mg , HCTZ 12.5mg , Amlodipine 5mg . She states that the medications made her "dizziness and sick on her stomach".  When she was asked to provide current medication information for pre-op she reported that she was not taking these medications, instead that she was on Bisoprolol-HCTZ 2.5/6.25mg  daily (she was on that in the past and tolerated it well).   Discussed at length the importance taking medications as directed. She reports home BP readings SBP 140-150, DBP 70-90s, HR 70-90s Denies CP/dyspnea/dizziness/HA/palpitations/hypotension. Patient Care Team    Relationship Specialty Notifications Start End  Mellody Dance, DO PCP - General Family Medicine  01/31/16   Gaynelle Arabian, MD Consulting Physician Orthopedic Surgery  01/31/16   Druscilla Brownie, MD Consulting Physician Dermatology  01/31/16   Gaynelle Arabian, MD Consulting Physician Orthopedic Surgery  01/31/16   Anastasio Auerbach, MD Consulting Physician Gynecology  02/06/16     Patient Active Problem List   Diagnosis Date Noted  . Pre-operative examination 07/31/2016  . TIA (transient  ischemic attack) 01/31/2016  . Vaginal mass 07/23/2014  . OA (osteoarthritis) of knee 05/23/2013  . OAB (overactive bladder) 04/16/2013  . Acute bronchitis 04/16/2013  . Post-nasal drip 05/30/2012  . Finger injury 05/30/2012  . Myalgia and myositis 01/30/2012  . Lung nodule seen on imaging study 01/23/2012  . Flank pain 01/22/2012  . Radicular low back pain 01/22/2012  . Rosacea 06/25/2011  . HTN (hypertension) 06/15/2011  . Back pain 06/15/2011  . Urinary frequency 06/15/2011     Past Medical History:  Diagnosis Date  . Arthritis   . Chicken pox as child  . Colon polyps   . Complication of anesthesia    "hard to wake up"  . GERD (gastroesophageal reflux disease)    occasional  . Hemorrhoids    "sometimes"  . Hypertension   . Lung nodule seen on imaging study    noted 8/13- needs CT f/u in 1 year  . Mononucleosis   . Pneumonia    hx of  . Rosacea   . TIA (transient ischemic attack) 2007   from IV dye, no residual, passed out and was told by RN that it was TIA     Past Surgical History:  Procedure Laterality Date  . ABDOMINAL HYSTERECTOMY  1993  . CHOLECYSTECTOMY  1992  . HEMORRHOID SURGERY  1960's  . TOTAL KNEE ARTHROPLASTY Left 05/23/2013   Procedure: LEFT TOTAL KNEE ARTHROPLASTY;  Surgeon: Gearlean Alf, MD;  Location: [facility];  Service: Orthopedics;  Laterality: Left;     Family History  Problem Relation Age of Onset  . Cancer Mother 11    colon  . Sudden death Father 62  suicide  . Stroke Maternal Grandmother 78  . Hypertension Neg Hx   . Hyperlipidemia Neg Hx   . Heart attack Neg Hx   . Diabetes Neg Hx      History  Drug Use No     History  Alcohol Use No     History  Smoking Status  . Never Smoker  Smokeless Tobacco  . Never Used     Outpatient Encounter Prescriptions as of 07/31/2016  Medication Sig  . aspirin EC 81 MG tablet Take 1 tablet (81 mg total) by mouth daily.  Marland Kitchen METRONIDAZOLE, TOPICAL, 0.75 % LOTN Apply 1  application topically as needed (rosacea).   . [DISCONTINUED] bisoprolol-hydrochlorothiazide (ZIAC) 2.5-6.25 MG tablet Take 1 tablet by mouth daily.  . bisoprolol-hydrochlorothiazide (ZIAC) 5-6.25 MG tablet Take 1 tablet by mouth daily.   No facility-administered encounter medications on file as of 07/31/2016.     Allergies: Other and Amlodipine  Body mass index is 42.67 kg/m.  Blood pressure (!) 167/77, pulse 73, height 5\' 5"  (1.651 m), weight 256 lb 6.4 oz (116.3 kg).  SHE INGESTED CUP OF COFFEE IMMEDIATELY PRIOR TO OV.    Review of Systems  Constitutional: Positive for activity change and unexpected weight change. Negative for appetite change, chills, diaphoresis, fatigue and fever.  HENT: Negative for congestion.   Eyes: Negative for visual disturbance.  Respiratory: Negative for cough, chest tightness, shortness of breath, wheezing and stridor.   Cardiovascular: Negative for chest pain, palpitations and leg swelling.  Endocrine: Negative for cold intolerance, heat intolerance, polydipsia, polyphagia and polyuria.  Genitourinary: Negative for difficulty urinating and flank pain.  Musculoskeletal: Positive for arthralgias, gait problem, joint swelling and myalgias. Negative for back pain.  Skin: Negative for color change, pallor, rash and wound.  Neurological: Negative for dizziness, tremors, weakness and headaches.  Psychiatric/Behavioral: Negative for agitation.       Objective:   Physical Exam  Constitutional: She appears well-developed and well-nourished. No distress.  HENT:  Head: Normocephalic and atraumatic.  Eyes: Conjunctivae are normal. Pupils are equal, round, and reactive to light.  Neck: Normal range of motion. Neck supple.  Cardiovascular: Normal rate, regular rhythm, normal heart sounds and intact distal pulses.   No murmur heard. Lymphadenopathy:    She has no cervical adenopathy.  Skin: She is not diaphoretic.          Assessment & Plan:   1.  Essential hypertension   2. Pre-operative examination     HTN (hypertension) Upon medication review discovered that she stopped her anti-hypertensives that was started in Sept 2017: Valsartan 40mg , HCTZ 12.5mg , Amlodipine 5mg . She states that the medications made her "dizziness and sick on her stomach".  When she was asked to provide current medication information for pre-op she reported that she was not taking these medications, instead that she was on Bisoprolol-HCTZ 2.5/6.25mg  daily (she was on that in the past and tolerated it well).   Discussed at length the importance taking medications as directed. She reports home BP readings SBP 140-150, DBP 70-90s, HR 70-90s Please continue home BP recordings and call clinic with results in 7 days. Denies CP/dyspnea/dizziness/HA/palpitations/hypotension. Reduce caffeine intake (reported ingesting coffee just prior to OV today).  Pre-operative examination Denies CP/dyspnea/dizziness/HA/palpitations. BP slightly elevated today-she ingested coffee right before OV today. She has pre-op labs tomorrow and EKG.     FOLLOW-UP:  Return in about 3 months (around 10/31/2016) for Regular Follow Up.

## 2016-07-31 NOTE — Telephone Encounter (Signed)
ERROR

## 2016-07-31 NOTE — Patient Instructions (Addendum)
Hypertension Hypertension, commonly called high blood pressure, is when the force of blood pumping through the arteries is too strong. The arteries are the blood vessels that carry blood from the heart throughout the body. Hypertension forces the heart to work harder to pump blood and may cause arteries to become narrow or stiff. Having untreated or uncontrolled hypertension can cause heart attacks, strokes, kidney disease, and other problems. A blood pressure reading consists of a higher number over a lower number. Ideally, your blood pressure should be below 120/80. The first ("top") number is called the systolic pressure. It is a measure of the pressure in your arteries as your heart beats. The second ("bottom") number is called the diastolic pressure. It is a measure of the pressure in your arteries as the heart relaxes. What are the causes? The cause of this condition is not known. What increases the risk? Some risk factors for high blood pressure are under your control. Others are not. Factors you can change   Smoking.  Having type 2 diabetes mellitus, high cholesterol, or both.  Not getting enough exercise or physical activity.  Being overweight.  Having too much fat, sugar, calories, or salt (sodium) in your diet.  Drinking too much alcohol. Factors that are difficult or impossible to change   Having chronic kidney disease.  Having a family history of high blood pressure.  Age. Risk increases with age.  Race. You may be at higher risk if you are African-American.  Gender. Men are at higher risk than women before age 45. After age 65, women are at higher risk than men.  Having obstructive sleep apnea.  Stress. What are the signs or symptoms? Extremely high blood pressure (hypertensive crisis) may cause:  Headache.  Anxiety.  Shortness of breath.  Nosebleed.  Nausea and vomiting.  Severe chest pain.  Jerky movements you cannot control (seizures). How is this  diagnosed? This condition is diagnosed by measuring your blood pressure while you are seated, with your arm resting on a surface. The cuff of the blood pressure monitor will be placed directly against the skin of your upper arm at the level of your heart. It should be measured at least twice using the same arm. Certain conditions can cause a difference in blood pressure between your right and left arms. Certain factors can cause blood pressure readings to be lower or higher than normal (elevated) for a short period of time:  When your blood pressure is higher when you are in a health care provider's office than when you are at home, this is called white coat hypertension. Most people with this condition do not need medicines.  When your blood pressure is higher at home than when you are in a health care provider's office, this is called masked hypertension. Most people with this condition may need medicines to control blood pressure. If you have a high blood pressure reading during one visit or you have normal blood pressure with other risk factors:  You may be asked to return on a different day to have your blood pressure checked again.  You may be asked to monitor your blood pressure at home for 1 week or longer. If you are diagnosed with hypertension, you may have other blood or imaging tests to help your health care provider understand your overall risk for other conditions. How is this treated? This condition is treated by making healthy lifestyle changes, such as eating healthy foods, exercising more, and reducing your alcohol intake. Your health   care provider may prescribe medicine if lifestyle changes are not enough to get your blood pressure under control, and if:  Your systolic blood pressure is above 130.  Your diastolic blood pressure is above 80. Your personal target blood pressure may vary depending on your medical conditions, your age, and other factors. Follow these instructions  at home: Eating and drinking   Eat a diet that is high in fiber and potassium, and low in sodium, added sugar, and fat. An example eating plan is called the DASH (Dietary Approaches to Stop Hypertension) diet. To eat this way:  Eat plenty of fresh fruits and vegetables. Try to fill half of your plate at each meal with fruits and vegetables.  Eat whole grains, such as whole wheat pasta, brown rice, or whole grain bread. Fill about one quarter of your plate with whole grains.  Eat or drink low-fat dairy products, such as skim milk or low-fat yogurt.  Avoid fatty cuts of meat, processed or cured meats, and poultry with skin. Fill about one quarter of your plate with lean proteins, such as fish, chicken without skin, beans, eggs, and tofu.  Avoid premade and processed foods. These tend to be higher in sodium, added sugar, and fat.  Reduce your daily sodium intake. Most people with hypertension should eat less than 1,500 mg of sodium a day.  Limit alcohol intake to no more than 1 drink a day for nonpregnant women and 2 drinks a day for men. One drink equals 12 oz of beer, 5 oz of wine, or 1 oz of hard liquor. Lifestyle   Work with your health care provider to maintain a healthy body weight or to lose weight. Ask what an ideal weight is for you.  Get at least 30 minutes of exercise that causes your heart to beat faster (aerobic exercise) most days of the week. Activities may include walking, swimming, or biking.  Include exercise to strengthen your muscles (resistance exercise), such as pilates or lifting weights, as part of your weekly exercise routine. Try to do these types of exercises for 30 minutes at least 3 days a week.  Do not use any products that contain nicotine or tobacco, such as cigarettes and e-cigarettes. If you need help quitting, ask your health care provider.  Monitor your blood pressure at home as told by your health care provider.  Keep all follow-up visits as told by  your health care provider. This is important. Medicines   Take over-the-counter and prescription medicines only as told by your health care provider. Follow directions carefully. Blood pressure medicines must be taken as prescribed.  Do not skip doses of blood pressure medicine. Doing this puts you at risk for problems and can make the medicine less effective.  Ask your health care provider about side effects or reactions to medicines that you should watch for. Contact a health care provider if:  You think you are having a reaction to a medicine you are taking.  You have headaches that keep coming back (recurring).  You feel dizzy.  You have swelling in your ankles.  You have trouble with your vision. Get help right away if:  You develop a severe headache or confusion.  You have unusual weakness or numbness.  You feel faint.  You have severe pain in your chest or abdomen.  You vomit repeatedly.  You have trouble breathing. Summary  Hypertension is when the force of blood pumping through your arteries is too strong. If this condition is   not controlled, it may put you at risk for serious complications.  Your personal target blood pressure may vary depending on your medical conditions, your age, and other factors. For most people, a normal blood pressure is less than 120/80.  Hypertension is treated with lifestyle changes, medicines, or a combination of both. Lifestyle changes include weight loss, eating a healthy, low-sodium diet, exercising more, and limiting alcohol. This information is not intended to replace advice given to you by your health care provider. Make sure you discuss any questions you have with your health care provider. Document Released: 05/01/2005 Document Revised: 03/29/2016 Document Reviewed: 03/29/2016 Elsevier Interactive Patient Education  2017 [employer].   Heart-Healthy Eating Plan Many factors influence your heart health, including eating and  exercise habits. Heart (coronary) risk increases with abnormal blood fat (lipid) levels. Heart-healthy meal planning includes limiting unhealthy fats, increasing healthy fats, and making other small dietary changes. This includes maintaining a healthy body weight to help keep lipid levels within a normal range. What is my plan? Your health care provider recommends that you:  Get no more than _________% of the total calories in your daily diet from fat.  Limit your intake of saturated fat to less than _________% of your total calories each day.  Limit the amount of cholesterol in your diet to less than _________ mg per day. What types of fat should I choose?  Choose healthy fats more often. Choose monounsaturated and polyunsaturated fats, such as olive oil and canola oil, flaxseeds, walnuts, almonds, and seeds.  Eat more omega-3 fats. Good choices include salmon, mackerel, sardines, tuna, flaxseed oil, and ground flaxseeds. Aim to eat fish at least two times each week.  Limit saturated fats. Saturated fats are primarily found in animal products, such as meats, butter, and cream. Plant sources of saturated fats include palm oil, palm kernel oil, and coconut oil.  Avoid foods with partially hydrogenated oils in them. These contain trans fats. Examples of foods that contain trans fats are stick margarine, some tub margarines, cookies, crackers, and other baked goods. What general guidelines do I need to follow?  Check food labels carefully to identify foods with trans fats or high amounts of saturated fat.  Fill one half of your plate with vegetables and green salads. Eat 4-5 servings of vegetables per day. A serving of vegetables equals 1 cup of raw leafy vegetables,  cup of raw or cooked cut-up vegetables, or  cup of vegetable juice.  Fill one fourth of your plate with whole grains. Look for the word "whole" as the first word in the ingredient list.  Fill one fourth of your plate with lean  protein foods.  Eat 4-5 servings of fruit per day. A serving of fruit equals one medium whole fruit,  cup of dried fruit,  cup of fresh, frozen, or canned fruit, or  cup of 100% fruit juice.  Eat more foods that contain soluble fiber. Examples of foods that contain this type of fiber are apples, broccoli, carrots, beans, peas, and barley. Aim to get 20-30 g of fiber per day.  Eat more home-cooked food and less restaurant, buffet, and fast food.  Limit or avoid alcohol.  Limit foods that are high in starch and sugar.  Avoid fried foods.  [place] foods by using methods other than frying. Baking, boiling, grilling, and broiling are all great options. Other fat-reducing suggestions include:  Removing the skin from poultry.  Removing all visible fats from meats.  Skimming the fat off  of stews, soups, and gravies before serving them.  Steaming vegetables in water or broth.  Lose weight if you are overweight. Losing just 5-10% of your initial body weight can help your overall health and prevent diseases such as diabetes and heart disease.  Increase your consumption of nuts, legumes, and seeds to 4-5 servings per week. One serving of dried beans or legumes equals  cup after being cooked, one serving of nuts equals 1 ounces, and one serving of seeds equals  ounce or 1 tablespoon.  You may need to monitor your salt (sodium) intake, especially if you have high blood pressure. Talk with your health care provider or dietitian to get more information about reducing sodium. What foods can I eat? Grains   Breads, including [place], white, pita, wheat, raisin, rye, oatmeal, and [place]. Tortillas that are neither fried nor made with lard or trans fat. Low-fat rolls, including hotdog and hamburger buns and English muffins. Biscuits. Muffins. Waffles. Pancakes. Light popcorn. Whole-grain cereals. Flatbread. Melba toast. Pretzels. Breadsticks. Rusks. Low-fat snacks and crackers, including oyster,  saltine, matzo, graham, animal, and rye. Rice and pasta, including brown rice and those that are made with whole wheat. Vegetables  All vegetables. Fruits  All fruits, but limit coconut. Meats and Other Protein Sources  Lean, well-trimmed beef, veal, pork, and lamb. Chicken and [place] without skin. All fish and shellfish. Wild duck, rabbit, pheasant, and venison. Egg whites or low-cholesterol egg substitutes. Dried beans, peas, lentils, and tofu.Seeds and most nuts. Dairy  Low-fat or nonfat cheeses, including ricotta, string, and mozzarella. Skim or 1% milk that is liquid, powdered, or evaporated. Buttermilk that is made with low-fat milk. Nonfat or low-fat yogurt. Beverages  Mineral water. Diet carbonated beverages. Sweets and Desserts  Sherbets and fruit ices. Honey, jam, marmalade, jelly, and syrups. Meringues and gelatins. Pure sugar candy, such as hard candy, jelly beans, gumdrops, mints, marshmallows, and small amounts of dark chocolate. [employer] Eat all sweets and desserts in moderation. Fats and Oils  Nonhydrogenated (trans-free) margarines. Vegetable oils, including soybean, sesame, sunflower, olive, peanut, safflower, corn, canola, and cottonseed. Salad dressings or mayonnaise that are made with a vegetable oil. Limit added fats and oils that you use for cooking, baking, salads, and as spreads. Other  Cocoa powder. Coffee and tea. All seasonings and condiments. The items listed above may not be a complete list of recommended foods or beverages. Contact your dietitian for more options.  What foods are not recommended? Grains  Breads that are made with saturated or trans fats, oils, or whole milk. Croissants. Butter rolls. Cheese breads. Sweet rolls. Donuts. Buttered popcorn. Chow mein noodles. High-fat crackers, such as cheese or butter crackers. Meats and Other Protein Sources  Fatty meats, such as hotdogs, short ribs, sausage, spareribs, bacon, ribeye roast or steak, and  mutton. High-fat deli meats, such as salami and bologna. Caviar. Domestic duck and goose. Organ meats, such as kidney, liver, sweetbreads, brains, gizzard, chitterlings, and heart. Dairy  Cream, sour cream, cream cheese, and creamed cottage cheese. Whole milk cheeses, including blue (bleu), Monte[place] Jack, [place], [place], American, [place], Swiss, [place], [place], and [place]. Whole or 2% milk that is liquid, evaporated, or condensed. Whole buttermilk. Cream sauce or high-fat cheese sauce. Yogurt that is made from whole milk. Beverages  Regular sodas and drinks with added sugar. Sweets and Desserts  Frosting. Pudding. Cookies. Cakes other than angel food cake. Candy that has milk chocolate or white chocolate, hydrogenated fat, butter, coconut, or unknown ingredients. Buttered  syrups. Full-fat ice cream or ice cream drinks. Fats and Oils  Gravy that has suet, meat fat, or shortening. Cocoa butter, hydrogenated oils, palm oil, coconut oil, palm kernel oil. These can often be found in baked products, candy, fried foods, nondairy creamers, and whipped toppings. Solid fats and shortenings, including bacon fat, salt pork, lard, and butter. Nondairy cream substitutes, such as coffee creamers and sour cream substitutes. Salad dressings that are made of unknown oils, cheese, or sour cream. The items listed above may not be a complete list of foods and beverages to avoid. Contact your dietitian for more information.  This information is not intended to replace advice given to you by your health care provider. Make sure you discuss any questions you have with your health care provider. Document Released: 02/08/2008 Document Revised: 11/19/2015 Document Reviewed: 10/23/2013 Elsevier Interactive Patient Education  2017 [employer]  Decrease saturated fat/sugar/carbohydrates. Reduce caffeine intake. Slowly increase regular movement after PT completed. Please return in 3 months for regular follow-up.

## 2016-07-31 NOTE — Assessment & Plan Note (Signed)
Denies CP/dyspnea/dizziness/HA/palpitations. BP slightly elevated today-she ingested coffee right before OV today. She has pre-op labs tomorrow and EKG.

## 2016-07-31 NOTE — Assessment & Plan Note (Addendum)
Upon medication review discovered that she stopped her anti-hypertensives that was started in Sept 2017: Valsartan 40mg , HCTZ 12.5mg , Amlodipine 5mg . She states that the medications made her "dizziness and sick on her stomach".  When she was asked to provide current medication information for pre-op she reported that she was not taking these medications, instead that she was on Bisoprolol-HCTZ 2.5/6.25mg  daily (she was on that in the past and tolerated it well).   Discussed at length the importance taking medications as directed. She reports home BP readings SBP 140-150, DBP 70-90s, HR 70-90s Please continue home BP recordings and call clinic with results in 7 days. Denies CP/dyspnea/dizziness/HA/palpitations/hypotension. Reduce caffeine intake (reported ingesting coffee just prior to OV today).

## 2016-07-31 NOTE — Patient Instructions (Addendum)
FOTINI LEMUS  07/31/2016   Your procedure is scheduled on: 08/09/16  Report to [facility] Main  Entrance to admitting at    0700AM.  Call this number if you have problems the morning of surgery [phone]   Remember: ONLY 1 PERSON MAY GO WITH YOU TO SHORT STAY TO GET  READY MORNING OF YOUR SURGERY.  Do not eat food or drink liquids :After Midnight.     Take these medicines the morning of surgery with A SIP OF WATER: bisoprolol- Hydrochlorithiazide                                You may not have any metal on your body including hair pins and              piercings  Do not wear jewelry, make-up, lotions, powders or perfumes, deodorant             No NAIL POLISH AND NO SHAVING 48 HOURS Prior to surgery               Do not bring valuables to the hospital. [place].  Contacts, dentures or bridgework may not be worn into surgery.  Leave suitcase in the car. After surgery it may be brought to your room.                 Please read over the following fact sheets you were given: _____________________________________________________________________             [facility] - Preparing for Surgery Before surgery, you can play an important role.  Because skin is not sterile, your skin needs to be as free of germs as possible.  You can reduce the number of germs on your skin by washing with CHG (chlorahexidine gluconate) soap before surgery.  CHG is an antiseptic cleaner which kills germs and bonds with the skin to continue killing germs even after washing. Please DO NOT use if you have an allergy to CHG or antibacterial soaps.  If your skin becomes reddened/irritated stop using the CHG and inform your nurse when you arrive at Short Stay. Do not shave (including legs and underarms) for at least 48 hours prior to the first CHG shower.  You may shave your face/neck. Please follow these instructions carefully:  1.   Shower with CHG Soap the night before surgery and the  morning of Surgery.  2.  If you choose to wash your hair, wash your hair first as usual with your  normal  shampoo.  3.  After you shampoo, rinse your hair and body thoroughly to remove the  shampoo.                           4.  Use CHG as you would any other liquid soap.  You can apply chg directly  to the skin and wash                       Gently with a scrungie or clean washcloth.  5.  Apply the CHG Soap to your body ONLY FROM THE NECK DOWN.   Do not use on face/ open  Wound or open sores. Avoid contact with eyes, ears mouth and genitals (private parts).                       Wash face,  Genitals (private parts) with your normal soap.             6.  Wash thoroughly, paying special attention to the area where your surgery  will be performed.  7.  Thoroughly rinse your body with warm water from the neck down.  8.  DO NOT shower/wash with your normal soap after using and rinsing off  the CHG Soap.                9.  Pat yourself dry with a clean towel.            10.  Wear clean pajamas.            11.  Place clean sheets on your bed the night of your first shower and do not  sleep with pets. Day of Surgery : Do not apply any lotions/deodorants the morning of surgery.  Please wear clean clothes to the hospital/surgery center.  FAILURE TO FOLLOW THESE INSTRUCTIONS MAY RESULT IN THE CANCELLATION OF YOUR SURGERY PATIENT SIGNATURE_________________________________  NURSE SIGNATURE__________________________________  ________________________________________________________________________  WHAT IS A BLOOD TRANSFUSION? Blood Transfusion Information  A transfusion is the replacement of blood or some of its parts. Blood is made up of multiple cells which provide different functions.  Red blood cells carry oxygen and are used for blood loss replacement.  White blood cells fight against infection.  Platelets control  bleeding.  Plasma helps clot blood.  Other blood products are available for specialized needs, such as hemophilia or other clotting disorders. BEFORE THE TRANSFUSION  Who gives blood for transfusions?   Healthy volunteers who are fully evaluated to make sure their blood is safe. This is blood bank blood. Transfusion therapy is the safest it has ever been in the practice of medicine. Before blood is taken from a donor, a complete history is taken to make sure that person has no history of diseases nor engages in risky social behavior (examples are intravenous drug use or sexual activity with multiple partners). The donor's travel history is screened to minimize risk of transmitting infections, such as malaria. The donated blood is tested for signs of infectious diseases, such as HIV and hepatitis. The blood is then tested to be sure it is compatible with you in order to minimize the chance of a transfusion reaction. If you or a relative donates blood, this is often done in anticipation of surgery and is not appropriate for emergency situations. It takes many days to process the donated blood. RISKS AND COMPLICATIONS Although transfusion therapy is very safe and saves many lives, the main dangers of transfusion include:   Getting an infectious disease.  Developing a transfusion reaction. This is an allergic reaction to something in the blood you were given. Every precaution is taken to prevent this. The decision to have a blood transfusion has been considered carefully by your caregiver before blood is given. Blood is not given unless the benefits outweigh the risks. AFTER THE TRANSFUSION  Right after receiving a blood transfusion, you will usually feel much better and more energetic. This is especially true if your red blood cells have gotten low (anemic). The transfusion raises the level of the red blood cells which carry oxygen, and this usually causes an energy increase.  The  nurse  administering the transfusion will monitor you carefully for complications. HOME CARE INSTRUCTIONS  No special instructions are needed after a transfusion. You may find your energy is better. Speak with your caregiver about any limitations on activity for underlying diseases you may have. SEEK MEDICAL CARE IF:   Your condition is not improving after your transfusion.  You develop redness or irritation at the intravenous (IV) site. SEEK IMMEDIATE MEDICAL CARE IF:  Any of the following symptoms occur over the next 12 hours:  Shaking chills.  You have a temperature by mouth above 102 F (38.9 C), not controlled by medicine.  Chest, back, or muscle pain.  People around you feel you are not acting correctly or are confused.  Shortness of breath or difficulty breathing.  Dizziness and fainting.  You get a rash or develop hives.  You have a decrease in urine output.  Your urine turns a dark color or changes to pink, red, or brown. Any of the following symptoms occur over the next 10 days:  You have a temperature by mouth above 102 F (38.9 C), not controlled by medicine.  Shortness of breath.  Weakness after normal activity.  The white part of the eye turns yellow (jaundice).  You have a decrease in the amount of urine or are urinating less often.  Your urine turns a dark color or changes to pink, red, or brown. Document Released: 04/28/2000 Document Revised: 07/24/2011 Document Reviewed: 12/16/2007 ExitCare Patient Information 2014 [place].  _______________________________________________________________________  Incentive Spirometer  An incentive spirometer is a tool that can help keep your lungs clear and active. This tool measures how well you are filling your lungs with each breath. Taking long deep breaths may help reverse or decrease the chance of developing breathing (pulmonary) problems (especially infection) following:  A long period of time when you are  unable to move or be active. BEFORE THE PROCEDURE   If the spirometer includes an indicator to show your best effort, your nurse or respiratory therapist will set it to a desired goal.  If possible, sit up straight or lean slightly forward. Try not to slouch.  Hold the incentive spirometer in an upright position. INSTRUCTIONS FOR USE  1. Sit on the edge of your bed if possible, or sit up as far as you can in bed or on a chair. 2. Hold the incentive spirometer in an upright position. 3. Breathe out normally. 4. Place the mouthpiece in your mouth and seal your lips tightly around it. 5. Breathe in slowly and as deeply as possible, raising the piston or the ball toward the top of the column. 6. Hold your breath for 3-5 seconds or for as long as possible. Allow the piston or ball to fall to the bottom of the column. 7. Remove the mouthpiece from your mouth and breathe out normally. 8. Rest for a few seconds and repeat Steps 1 through 7 at least 10 times every 1-2 hours when you are awake. Take your time and take a few normal breaths between deep breaths. 9. The spirometer may include an indicator to show your best effort. Use the indicator as a goal to work toward during each repetition. 10. After each set of 10 deep breaths, practice coughing to be sure your lungs are clear. If you have an incision (the cut made at the time of surgery), support your incision when coughing by placing a pillow or rolled up towels firmly against it. Once you are able to get  out of bed, walk around indoors and cough well. You may stop using the incentive spirometer when instructed by your caregiver.  RISKS AND COMPLICATIONS  Take your time so you do not get dizzy or light-headed.  If you are in pain, you may need to take or ask for pain medication before doing incentive spirometry. It is harder to take a deep breath if you are having pain. AFTER USE  Rest and breathe slowly and easily.  It can be helpful to  keep track of a log of your progress. Your caregiver can provide you with a simple table to help with this. If you are using the spirometer at home, follow these instructions: [place] IF:   You are having difficultly using the spirometer.  You have trouble using the spirometer as often as instructed.  Your pain medication is not giving enough relief while using the spirometer.  You develop fever of 100.5 F (38.1 C) or higher. SEEK IMMEDIATE MEDICAL CARE IF:   You cough up bloody sputum that had not been present before.  You develop fever of 102 F (38.9 C) or greater.  You develop worsening pain at or near the incision site. MAKE SURE YOU:   Understand these instructions.  Will watch your condition.  Will get help right away if you are not doing well or get worse. Document Released: 09/11/2006 Document Revised: 07/24/2011 Document Reviewed: 11/12/2006 [facility] Patient Information 2014 [place], [place].   ________________________________________________________________________

## 2016-08-01 ENCOUNTER — Encounter (HOSPITAL_COMMUNITY): Payer: Self-pay

## 2016-08-01 ENCOUNTER — Encounter (HOSPITAL_COMMUNITY)
Admission: RE | Admit: 2016-08-01 | Discharge: 2016-08-01 | Disposition: A | Discharge status: Home / Self Care | Payer: Medicare Other | Source: Ambulatory Visit | Attending: Orthopedic Surgery | Admitting: Orthopedic Surgery

## 2016-08-01 DIAGNOSIS — Z01818 Encounter for other preprocedural examination: Secondary | ICD-10-CM | POA: Insufficient documentation

## 2016-08-01 DIAGNOSIS — M1711 Unilateral primary osteoarthritis, right knee: Secondary | ICD-10-CM | POA: Diagnosis not present

## 2016-08-01 HISTORY — DX: Other specified postprocedural states: Z98.890

## 2016-08-01 HISTORY — DX: Other specified postprocedural states: R11.2

## 2016-08-01 HISTORY — DX: Malignant (primary) neoplasm, unspecified: C80.1

## 2016-08-01 LAB — CBC
HEMATOCRIT: 43.5 % (ref 36.0–46.0)
Hemoglobin: 14.3 g/dL (ref 12.0–15.0)
MCH: 30.7 pg (ref 26.0–34.0)
MCHC: 32.9 g/dL (ref 30.0–36.0)
MCV: 93.3 fL (ref 78.0–100.0)
Platelets: 176 10*3/uL (ref 150–400)
RBC: 4.66 MIL/uL (ref 3.87–5.11)
RDW: 13.9 % (ref 11.5–15.5)
WBC: 7.4 10*3/uL (ref 4.0–10.5)

## 2016-08-01 LAB — COMPREHENSIVE METABOLIC PANEL
ALT: 42 U/L (ref 14–54)
AST: 44 U/L — ABNORMAL HIGH (ref 15–41)
Albumin: 4.3 g/dL (ref 3.5–5.0)
Alkaline Phosphatase: 80 U/L (ref 38–126)
Anion gap: 6 (ref 5–15)
BILIRUBIN TOTAL: 0.8 mg/dL (ref 0.3–1.2)
BUN: 9 mg/dL (ref 6–20)
CO2: 28 mmol/L (ref 22–32)
CREATININE: 0.66 mg/dL (ref 0.44–1.00)
Calcium: 9.4 mg/dL (ref 8.9–10.3)
Chloride: 106 mmol/L (ref 101–111)
Glucose, Bld: 112 mg/dL — ABNORMAL HIGH (ref 65–99)
POTASSIUM: 4.6 mmol/L (ref 3.5–5.1)
Sodium: 140 mmol/L (ref 135–145)
TOTAL PROTEIN: 7.6 g/dL (ref 6.5–8.1)

## 2016-08-01 LAB — SURGICAL PCR SCREEN
MRSA, PCR: NEGATIVE
STAPHYLOCOCCUS AUREUS: NEGATIVE

## 2016-08-01 LAB — APTT: aPTT: 33 seconds (ref 24–36)

## 2016-08-01 LAB — PROTIME-INR
INR: 1.06
PROTHROMBIN TIME: 13.8 s (ref 11.4–15.2)

## 2016-08-01 NOTE — Progress Notes (Signed)
Clearance 07/31/16 on chart

## 2016-08-01 NOTE — Progress Notes (Signed)
Spoke with Dr. Smith Robert Anesthesia regarding pt. Bisoprolol- Hydrochlorathiazide  To take in am prior to surgery. He said yes pt. Is to take this med am before surgery instructed pt. To do so . Pt. VU

## 2016-08-07 ENCOUNTER — Telehealth: Payer: Self-pay | Admitting: Family Medicine

## 2016-08-07 NOTE — Telephone Encounter (Signed)
FYI

## 2016-08-07 NOTE — Telephone Encounter (Signed)
Pt cld to report BP reading as request by nurse/provider--states BP meds not rcv'd T pharmacy til Tues 3/20 so she started recording them on Monday:                        Blood Pressure ---------- Pulse   3/19  159/84   71  3/20  113/89   60  3/21  155/89   51  3/22  137/95   59  3/23  166/81   55  3/24  153/78   56 3/25  142/87   72 3/26  115/79   56  Pt states she is scheduled for surgery on Wednesday 3/28 --but has slight cold and is unsure if doctor will proceed.  Please give her a call. -glh

## 2016-08-07 NOTE — Telephone Encounter (Signed)
Good Afternoon Kimberly Ferrell, Can you please call Kimberly Ferrell- Her BP numbers look okay, please confirm she is taking the correct medication/dosage. Please ask how she is feeling? It is not unusual for her surgeon to post-pone her procedure if she is acutely ill. Please ask if she needs anything else from [place]. Thanks! Valetta Fuller

## 2016-08-07 NOTE — Telephone Encounter (Signed)
Pt informed.  Pt confirmed correct dosage and medication.  Pt states that she is feeling much better since starting the bisoprolol/hctz.  Charyl Bigger, CMA

## 2016-08-09 ENCOUNTER — Encounter (HOSPITAL_COMMUNITY)
Admission: RE | Disposition: A | Discharge status: Home / Self Care | Payer: Self-pay | Source: Ambulatory Visit | Attending: Orthopedic Surgery

## 2016-08-09 ENCOUNTER — Ambulatory Visit (HOSPITAL_COMMUNITY): Payer: Medicare Other | Admitting: Anesthesiology

## 2016-08-09 ENCOUNTER — Encounter (HOSPITAL_COMMUNITY): Payer: Self-pay | Admitting: Anesthesiology

## 2016-08-09 ENCOUNTER — Observation Stay (HOSPITAL_COMMUNITY)
Admission: RE | Admit: 2016-08-09 | Discharge: 2016-08-10 | Disposition: A | Discharge status: Home / Self Care | Payer: Medicare Other | Source: Ambulatory Visit | Attending: Orthopedic Surgery | Admitting: Orthopedic Surgery

## 2016-08-09 DIAGNOSIS — Z79899 Other long term (current) drug therapy: Secondary | ICD-10-CM | POA: Insufficient documentation

## 2016-08-09 DIAGNOSIS — Z8673 Personal history of transient ischemic attack (TIA), and cerebral infarction without residual deficits: Secondary | ICD-10-CM | POA: Insufficient documentation

## 2016-08-09 DIAGNOSIS — Z96652 Presence of left artificial knee joint: Secondary | ICD-10-CM | POA: Insufficient documentation

## 2016-08-09 DIAGNOSIS — I10 Essential (primary) hypertension: Secondary | ICD-10-CM | POA: Diagnosis not present

## 2016-08-09 DIAGNOSIS — K219 Gastro-esophageal reflux disease without esophagitis: Secondary | ICD-10-CM | POA: Insufficient documentation

## 2016-08-09 DIAGNOSIS — M1711 Unilateral primary osteoarthritis, right knee: Secondary | ICD-10-CM | POA: Diagnosis not present

## 2016-08-09 DIAGNOSIS — L719 Rosacea, unspecified: Secondary | ICD-10-CM | POA: Diagnosis not present

## 2016-08-09 DIAGNOSIS — Z85828 Personal history of other malignant neoplasm of skin: Secondary | ICD-10-CM | POA: Diagnosis not present

## 2016-08-09 DIAGNOSIS — M1710 Unilateral primary osteoarthritis, unspecified knee: Secondary | ICD-10-CM | POA: Diagnosis present

## 2016-08-09 DIAGNOSIS — Z6841 Body Mass Index (BMI) 40.0 and over, adult: Secondary | ICD-10-CM | POA: Diagnosis not present

## 2016-08-09 DIAGNOSIS — M179 Osteoarthritis of knee, unspecified: Secondary | ICD-10-CM | POA: Diagnosis present

## 2016-08-09 DIAGNOSIS — G459 Transient cerebral ischemic attack, unspecified: Secondary | ICD-10-CM | POA: Diagnosis not present

## 2016-08-09 DIAGNOSIS — G8918 Other acute postprocedural pain: Secondary | ICD-10-CM | POA: Diagnosis not present

## 2016-08-09 HISTORY — PX: PARTIAL KNEE ARTHROPLASTY: SHX2174

## 2016-08-09 LAB — TYPE AND SCREEN
ABO/RH(D): O POS
Antibody Screen: NEGATIVE

## 2016-08-09 SURGERY — ARTHROPLASTY, KNEE, UNICOMPARTMENTAL
Anesthesia: General | Site: Knee | Laterality: Right

## 2016-08-09 MED ORDER — ACETAMINOPHEN 325 MG PO TABS: 650.0000 mg | ORAL_TABLET | Freq: Four times a day (QID) | ORAL | Status: DC | PRN

## 2016-08-09 MED ORDER — ROCURONIUM BROMIDE 50 MG/5ML IV SOSY
PREFILLED_SYRINGE | INTRAVENOUS | Status: AC
  Filled 2016-08-09: qty 5

## 2016-08-09 MED ORDER — HYDROMORPHONE HCL 1 MG/ML IJ SOLN
INTRAMUSCULAR | Status: DC
Start: 2016-08-09 — End: 2016-08-09
  Filled 2016-08-09: qty 1

## 2016-08-09 MED ORDER — FENTANYL CITRATE (PF) 100 MCG/2ML IJ SOLN
INTRAMUSCULAR | Status: AC
  Filled 2016-08-09: qty 2

## 2016-08-09 MED ORDER — BUPIVACAINE HCL (PF) 0.25 % IJ SOLN
INTRAMUSCULAR | Status: AC
  Filled 2016-08-09: qty 30

## 2016-08-09 MED ORDER — ROCURONIUM BROMIDE 10 MG/ML (PF) SYRINGE
PREFILLED_SYRINGE | INTRAVENOUS | Status: DC | PRN
  Administered 2016-08-09: 50 mg via INTRAVENOUS

## 2016-08-09 MED ORDER — LACTATED RINGERS IV SOLN
INTRAVENOUS | Status: DC
  Administered 2016-08-09 (×3): via INTRAVENOUS

## 2016-08-09 MED ORDER — FLEET ENEMA 7-19 GM/118ML RE ENEM: 1.0000 | ENEMA | Freq: Once | RECTAL | Status: DC | PRN

## 2016-08-09 MED ORDER — CEFAZOLIN SODIUM-DEXTROSE 2-4 GM/100ML-% IV SOLN
2.0000 g | Freq: Four times a day (QID) | INTRAVENOUS | Status: AC
  Administered 2016-08-09 (×2): 2 g via INTRAVENOUS
  Filled 2016-08-09 (×2): qty 100

## 2016-08-09 MED ORDER — MORPHINE SULFATE (PF) 4 MG/ML IV SOLN: 1.0000 mg | INTRAVENOUS | Status: DC | PRN

## 2016-08-09 MED ORDER — PROPOFOL 500 MG/50ML IV EMUL
INTRAVENOUS | Status: DC | PRN
  Administered 2016-08-09: 50 ug/kg/min via INTRAVENOUS

## 2016-08-09 MED ORDER — HYDROMORPHONE HCL 1 MG/ML IJ SOLN
0.2500 mg | INTRAMUSCULAR | Status: DC | PRN
  Administered 2016-08-09 (×2): 0.5 mg via INTRAVENOUS

## 2016-08-09 MED ORDER — PROPOFOL 10 MG/ML IV BOLUS
INTRAVENOUS | Status: AC
  Filled 2016-08-09: qty 20

## 2016-08-09 MED ORDER — BISOPROLOL-HYDROCHLOROTHIAZIDE 5-6.25 MG PO TABS
1.0000 | ORAL_TABLET | Freq: Every day | ORAL | Status: DC
  Administered 2016-08-10: 1 via ORAL
  Filled 2016-08-09: qty 1

## 2016-08-09 MED ORDER — PHENOL 1.4 % MT LIQD
1.0000 | OROMUCOSAL | Status: DC | PRN
Start: 2016-08-09 — End: 2016-08-10

## 2016-08-09 MED ORDER — HYDROMORPHONE HCL 1 MG/ML IJ SOLN
INTRAMUSCULAR | Status: AC
  Administered 2016-08-09: 0.5 mg via INTRAVENOUS
  Filled 2016-08-09: qty 1

## 2016-08-09 MED ORDER — DOCUSATE SODIUM 100 MG PO CAPS
100.0000 mg | ORAL_CAPSULE | Freq: Two times a day (BID) | ORAL | Status: DC
  Administered 2016-08-09 – 2016-08-10 (×2): 100 mg via ORAL
  Filled 2016-08-09 (×2): qty 1

## 2016-08-09 MED ORDER — PROPOFOL 10 MG/ML IV BOLUS
INTRAVENOUS | Status: AC
  Filled 2016-08-09: qty 40

## 2016-08-09 MED ORDER — TRANEXAMIC ACID 1000 MG/10ML IV SOLN
1000.0000 mg | INTRAVENOUS | Status: AC
  Administered 2016-08-09: 1000 mg via INTRAVENOUS
  Filled 2016-08-09: qty 1100

## 2016-08-09 MED ORDER — MIDAZOLAM HCL 5 MG/5ML IJ SOLN
INTRAMUSCULAR | Status: DC | PRN
  Administered 2016-08-09: 2 mg via INTRAVENOUS

## 2016-08-09 MED ORDER — BUPIVACAINE-EPINEPHRINE (PF) 0.5% -1:200000 IJ SOLN
INTRAMUSCULAR | Status: DC | PRN
  Administered 2016-08-09: 30 mL via PERINEURAL

## 2016-08-09 MED ORDER — ONDANSETRON HCL 4 MG/2ML IJ SOLN
4.0000 mg | Freq: Four times a day (QID) | INTRAMUSCULAR | Status: DC | PRN
  Administered 2016-08-09: 14:00:00 4 mg via INTRAVENOUS
  Filled 2016-08-09: qty 2

## 2016-08-09 MED ORDER — GLYCOPYRROLATE 0.2 MG/ML IV SOSY
PREFILLED_SYRINGE | INTRAVENOUS | Status: DC | PRN
  Administered 2016-08-09: .2 mg via INTRAVENOUS

## 2016-08-09 MED ORDER — ACETAMINOPHEN 500 MG PO TABS
1000.0000 mg | ORAL_TABLET | Freq: Four times a day (QID) | ORAL | Status: AC
  Administered 2016-08-09 – 2016-08-10 (×4): 1000 mg via ORAL
  Filled 2016-08-09 (×4): qty 2

## 2016-08-09 MED ORDER — MENTHOL 3 MG MT LOZG: 1.0000 | LOZENGE | OROMUCOSAL | Status: DC | PRN

## 2016-08-09 MED ORDER — BUPIVACAINE LIPOSOME 1.3 % IJ SUSP
INTRAMUSCULAR | Status: DC | PRN
  Administered 2016-08-09: 20 mL

## 2016-08-09 MED ORDER — DEXAMETHASONE SODIUM PHOSPHATE 10 MG/ML IJ SOLN
10.0000 mg | Freq: Once | INTRAMUSCULAR | Status: AC
  Administered 2016-08-10: 11:00:00 10 mg via INTRAVENOUS
  Filled 2016-08-09: qty 1

## 2016-08-09 MED ORDER — PHENYLEPHRINE 40 MCG/ML (10ML) SYRINGE FOR IV PUSH (FOR BLOOD PRESSURE SUPPORT)
PREFILLED_SYRINGE | INTRAVENOUS | Status: DC | PRN
  Administered 2016-08-09 (×2): 80 ug via INTRAVENOUS

## 2016-08-09 MED ORDER — ONDANSETRON HCL 4 MG PO TABS: 4.0000 mg | ORAL_TABLET | Freq: Four times a day (QID) | ORAL | Status: DC | PRN

## 2016-08-09 MED ORDER — FENTANYL CITRATE (PF) 100 MCG/2ML IJ SOLN: 50.0000 ug | INTRAMUSCULAR | Status: DC | PRN

## 2016-08-09 MED ORDER — LIDOCAINE 2% (20 MG/ML) 5 ML SYRINGE
INTRAMUSCULAR | Status: DC | PRN
  Administered 2016-08-09: 80 mg via INTRAVENOUS

## 2016-08-09 MED ORDER — MIDAZOLAM HCL 2 MG/2ML IJ SOLN
INTRAMUSCULAR | Status: AC
  Filled 2016-08-09: qty 2

## 2016-08-09 MED ORDER — SODIUM CHLORIDE 0.9 % IJ SOLN
INTRAMUSCULAR | Status: DC | PRN
  Administered 2016-08-09: 30 mL

## 2016-08-09 MED ORDER — ACETAMINOPHEN 10 MG/ML IV SOLN
1000.0000 mg | Freq: Once | INTRAVENOUS | Status: AC
  Administered 2016-08-09: 1000 mg via INTRAVENOUS

## 2016-08-09 MED ORDER — DEXAMETHASONE SODIUM PHOSPHATE 10 MG/ML IJ SOLN
INTRAMUSCULAR | Status: AC
  Filled 2016-08-09: qty 1

## 2016-08-09 MED ORDER — CHLORHEXIDINE GLUCONATE 4 % EX LIQD: 60.0000 mL | Freq: Once | CUTANEOUS | Status: DC

## 2016-08-09 MED ORDER — METOCLOPRAMIDE HCL 5 MG/ML IJ SOLN
5.0000 mg | Freq: Three times a day (TID) | INTRAMUSCULAR | Status: DC | PRN
  Administered 2016-08-09: 14:00:00 10 mg via INTRAVENOUS
  Filled 2016-08-09: qty 2

## 2016-08-09 MED ORDER — PROMETHAZINE HCL 25 MG/ML IJ SOLN: 6.2500 mg | INTRAMUSCULAR | Status: DC | PRN

## 2016-08-09 MED ORDER — RIVAROXABAN 10 MG PO TABS
10.0000 mg | ORAL_TABLET | Freq: Every day | ORAL | Status: DC
  Administered 2016-08-10: 09:00:00 10 mg via ORAL

## 2016-08-09 MED ORDER — LIDOCAINE 2% (20 MG/ML) 5 ML SYRINGE
INTRAMUSCULAR | Status: AC
  Filled 2016-08-09: qty 5

## 2016-08-09 MED ORDER — POLYETHYLENE GLYCOL 3350 17 G PO PACK: 17.0000 g | PACK | Freq: Every day | ORAL | Status: DC | PRN

## 2016-08-09 MED ORDER — SODIUM CHLORIDE 0.9 % IV SOLN
INTRAVENOUS | Status: DC
  Administered 2016-08-09 – 2016-08-10 (×2): via INTRAVENOUS

## 2016-08-09 MED ORDER — ONDANSETRON HCL 4 MG/2ML IJ SOLN
INTRAMUSCULAR | Status: AC
  Filled 2016-08-09: qty 2

## 2016-08-09 MED ORDER — ONDANSETRON HCL 4 MG/2ML IJ SOLN
INTRAMUSCULAR | Status: DC | PRN
  Administered 2016-08-09: 4 mg via INTRAVENOUS

## 2016-08-09 MED ORDER — ACETAMINOPHEN 10 MG/ML IV SOLN
INTRAVENOUS | Status: AC
  Filled 2016-08-09: qty 100

## 2016-08-09 MED ORDER — SODIUM CHLORIDE 0.9 % IR SOLN
Status: DC | PRN
  Administered 2016-08-09: 1000 mL

## 2016-08-09 MED ORDER — DEXAMETHASONE SODIUM PHOSPHATE 10 MG/ML IJ SOLN
10.0000 mg | Freq: Once | INTRAMUSCULAR | Status: AC
  Administered 2016-08-09: 10 mg via INTRAVENOUS

## 2016-08-09 MED ORDER — OXYCODONE HCL 5 MG PO TABS: 5.0000 mg | ORAL_TABLET | ORAL | Status: DC | PRN

## 2016-08-09 MED ORDER — TRAMADOL HCL 50 MG PO TABS: 50.0000 mg | ORAL_TABLET | Freq: Four times a day (QID) | ORAL | Status: DC | PRN

## 2016-08-09 MED ORDER — MIDAZOLAM HCL 2 MG/2ML IJ SOLN: 1.0000 mg | INTRAMUSCULAR | Status: DC | PRN

## 2016-08-09 MED ORDER — METHOCARBAMOL 500 MG PO TABS
500.0000 mg | ORAL_TABLET | Freq: Four times a day (QID) | ORAL | Status: DC | PRN
  Administered 2016-08-09 – 2016-08-10 (×2): 500 mg via ORAL
  Filled 2016-08-09 (×2): qty 1

## 2016-08-09 MED ORDER — ACETAMINOPHEN 650 MG RE SUPP: 650.0000 mg | Freq: Four times a day (QID) | RECTAL | Status: DC | PRN

## 2016-08-09 MED ORDER — METOCLOPRAMIDE HCL 5 MG PO TABS: 5.0000 mg | ORAL_TABLET | Freq: Three times a day (TID) | ORAL | Status: DC | PRN

## 2016-08-09 MED ORDER — CEFAZOLIN SODIUM-DEXTROSE 2-4 GM/100ML-% IV SOLN
INTRAVENOUS | Status: AC
  Filled 2016-08-09: qty 100

## 2016-08-09 MED ORDER — SUGAMMADEX SODIUM 200 MG/2ML IV SOLN
INTRAVENOUS | Status: DC | PRN
  Administered 2016-08-09: 200 mg via INTRAVENOUS

## 2016-08-09 MED ORDER — DIPHENHYDRAMINE HCL 12.5 MG/5ML PO ELIX: 12.5000 mg | ORAL_SOLUTION | ORAL | Status: DC | PRN

## 2016-08-09 MED ORDER — PHENYLEPHRINE 40 MCG/ML (10ML) SYRINGE FOR IV PUSH (FOR BLOOD PRESSURE SUPPORT)
PREFILLED_SYRINGE | INTRAVENOUS | Status: AC
  Filled 2016-08-09: qty 10

## 2016-08-09 MED ORDER — BISACODYL 10 MG RE SUPP: 10.0000 mg | Freq: Every day | RECTAL | Status: DC | PRN

## 2016-08-09 MED ORDER — BUPIVACAINE LIPOSOME 1.3 % IJ SUSP
20.0000 mL | Freq: Once | INTRAMUSCULAR | Status: DC
  Filled 2016-08-09: qty 20

## 2016-08-09 MED ORDER — METHOCARBAMOL 1000 MG/10ML IJ SOLN
500.0000 mg | Freq: Four times a day (QID) | INTRAVENOUS | Status: DC | PRN
  Administered 2016-08-09: 500 mg via INTRAVENOUS
  Filled 2016-08-09: qty 550
  Filled 2016-08-09 (×2): qty 5

## 2016-08-09 MED ORDER — PROPOFOL 10 MG/ML IV BOLUS
INTRAVENOUS | Status: DC | PRN
  Administered 2016-08-09: 200 mg via INTRAVENOUS

## 2016-08-09 MED ORDER — CEFAZOLIN SODIUM-DEXTROSE 2-4 GM/100ML-% IV SOLN
2.0000 g | INTRAVENOUS | Status: AC
  Administered 2016-08-09: 2 g via INTRAVENOUS

## 2016-08-09 MED ORDER — SODIUM CHLORIDE 0.9 % IJ SOLN
INTRAMUSCULAR | Status: AC
  Filled 2016-08-09: qty 50

## 2016-08-09 MED ORDER — FENTANYL CITRATE (PF) 100 MCG/2ML IJ SOLN
INTRAMUSCULAR | Status: DC | PRN
Start: 2016-08-09 — End: 2016-08-09
  Administered 2016-08-09: 50 ug via INTRAVENOUS
  Administered 2016-08-09: 100 ug via INTRAVENOUS
  Administered 2016-08-09: 50 ug via INTRAVENOUS

## 2016-08-09 MED ORDER — 0.9 % SODIUM CHLORIDE (POUR BTL) OPTIME
TOPICAL | Status: DC | PRN
  Administered 2016-08-09: 1000 mL

## 2016-08-09 SURGICAL SUPPLY — 46 items
BAG SPEC THK2 15X12 ZIP CLS (MISCELLANEOUS) ×1
BAG ZIPLOCK 12X15 (MISCELLANEOUS) ×2 IMPLANT
BANDAGE ACE 6X5 VEL STRL LF (GAUZE/BANDAGES/DRESSINGS) ×3 IMPLANT
BLADE SAW RECIPROCATING 77.5 (BLADE) ×3 IMPLANT
BLADE SAW SGTL 13.0X1.19X90.0M (BLADE) ×3 IMPLANT
BOWL SMART MIX CTS (DISPOSABLE) ×3 IMPLANT
BUR OVAL CARBIDE 4.0 (BURR) ×3 IMPLANT
CAPT KNEE PARTIAL 2 ×3 IMPLANT
CEMENT HV SMART SET (Cement) ×3 IMPLANT
CLOSURE WOUND 1/2 X4 (GAUZE/BANDAGES/DRESSINGS) ×1
CLOTH BEACON ORANGE TIMEOUT ST (SAFETY) ×3 IMPLANT
CUFF TOURN SGL QUICK 34 (TOURNIQUET CUFF) ×3
CUFF TRNQT CYL 34X4X40X1 (TOURNIQUET CUFF) ×1 IMPLANT
DRSG ADAPTIC 3X8 NADH LF (GAUZE/BANDAGES/DRESSINGS) ×3 IMPLANT
DRSG PAD ABDOMINAL 8X10 ST (GAUZE/BANDAGES/DRESSINGS) ×3 IMPLANT
DURAPREP 26ML APPLICATOR (WOUND CARE) ×3 IMPLANT
ELECT REM PT RETURN 15FT ADLT (MISCELLANEOUS) ×3 IMPLANT
EVACUATOR 1/8 PVC DRAIN (DRAIN) ×3 IMPLANT
GAUZE SPONGE 4X4 12PLY STRL (GAUZE/BANDAGES/DRESSINGS) ×3 IMPLANT
GLOVE BIO SURGEON STRL SZ7.5 (GLOVE) ×3 IMPLANT
GLOVE BIO SURGEON STRL SZ8 (GLOVE) ×3 IMPLANT
GLOVE BIOGEL PI IND STRL 7.5 (GLOVE) IMPLANT
GLOVE BIOGEL PI IND STRL 8 (GLOVE) ×2 IMPLANT
GLOVE BIOGEL PI INDICATOR 7.5 (GLOVE) ×8
GLOVE BIOGEL PI INDICATOR 8 (GLOVE) ×2
GLOVE SURG SS PI 7.5 STRL IVOR (GLOVE) ×4 IMPLANT
GOWN STRL REUS W/TWL LRG LVL3 (GOWN DISPOSABLE) ×3 IMPLANT
GOWN STRL REUS W/TWL XL LVL3 (GOWN DISPOSABLE) ×5 IMPLANT
HANDPIECE INTERPULSE COAX TIP (DISPOSABLE) ×3
IMMOBILIZER KNEE 20 (SOFTGOODS) ×3
IMMOBILIZER KNEE 20 THIGH 36 (SOFTGOODS) ×1 IMPLANT
KIT IMPL STRL TIB IPOLY IUNI IMPLANT
MANIFOLD NEPTUNE II (INSTRUMENTS) ×3 IMPLANT
PACK TOTAL KNEE CUSTOM (KITS) ×3 IMPLANT
PAD ABD 8X10 STRL (GAUZE/BANDAGES/DRESSINGS) ×2 IMPLANT
PADDING CAST COTTON 6X4 STRL (CAST SUPPLIES) ×4 IMPLANT
POSITIONER SURGICAL ARM (MISCELLANEOUS) ×3 IMPLANT
SET HNDPC FAN SPRY TIP SCT (DISPOSABLE) ×1 IMPLANT
STRIP CLOSURE SKIN 1/2X4 (GAUZE/BANDAGES/DRESSINGS) ×3 IMPLANT
SUT MNCRL AB 4-0 PS2 18 (SUTURE) ×3 IMPLANT
SUT STRATAFIX 0 PDS 27 VIOLET (SUTURE) ×3
SUT VIC AB 2-0 CT1 27 (SUTURE) ×6
SUT VIC AB 2-0 CT1 TAPERPNT 27 (SUTURE) ×2 IMPLANT
SUTURE STRATFX 0 PDS 27 VIOLET (SUTURE) IMPLANT
SYR 50ML LL SCALE MARK (SYRINGE) ×3 IMPLANT
WRAP KNEE MAXI GEL POST OP (GAUZE/BANDAGES/DRESSINGS) ×2 IMPLANT

## 2016-08-09 NOTE — Anesthesia Procedure Notes (Signed)
Anesthesia Regional Block: Adductor canal block   Pre-Anesthetic Checklist: ,, timeout performed, Correct Patient, Correct Site, Correct Laterality, Correct Procedure, Correct Position, site marked, Risks and benefits discussed,  Surgical consent,  Pre-op evaluation,  At surgeon's request and post-op pain management  Laterality: Right  Prep: chloraprep       Needles:  Injection technique: Single-shot  Needle Type: Echogenic Needle     Needle Length: 9cm      Additional Needles:   Procedures: ultrasound guided,,,,,,,,  Narrative:  Start time: 08/09/2016 8:00 AM End time: 08/09/2016 8:10 AM Injection made incrementally with aspirations every 5 mL.  Performed by: Personally  Anesthesiologist: Trace Wirick  Additional Notes: Patient tolerated the procedure well without complications

## 2016-08-09 NOTE — Progress Notes (Signed)
Assisted Dr. Rose with right, ultrasound guided, adductor canal block. Side rails up, monitors on throughout procedure. See vital signs in flow sheet. Tolerated Procedure well.  

## 2016-08-09 NOTE — Interval H&P Note (Signed)
History and Physical Interval Note:  08/09/2016 7:25 AM  Kimberly Ferrell  has presented today for surgery, with the diagnosis of medial compartment osteoarthritis right knee  The various methods of treatment have been discussed with the patient and family. After consideration of risks, benefits and other options for treatment, the patient has consented to  Procedure(s) with comments: RIGHT UNICOMPARTMENTAL KNEE (Right) - requests 1hr as a surgical intervention .  The patient's history has been reviewed, patient examined, no change in status, stable for surgery.  I have reviewed the patient's chart and labs.  Questions were answered to the patient's satisfaction.     Gearlean Alf

## 2016-08-09 NOTE — Anesthesia Preprocedure Evaluation (Signed)
Anesthesia Evaluation  Patient identified by MRN, date of birth, ID band Patient awake    Reviewed: Allergy & Precautions, NPO status , Patient's Chart, lab work & pertinent test results  History of Anesthesia Complications (+) PONV  Airway Mallampati: III  TM Distance: <3 FB Neck ROM: Full    Dental no notable dental hx.    Pulmonary neg pulmonary ROS,    Pulmonary exam normal breath sounds clear to auscultation       Cardiovascular hypertension, Normal cardiovascular exam Rhythm:Regular Rate:Normal     Neuro/Psych negative neurological ROS  negative psych ROS   GI/Hepatic Neg liver ROS, GERD  ,  Endo/Other  Morbid obesity  Renal/GU negative Renal ROS  negative genitourinary   Musculoskeletal negative musculoskeletal ROS (+)   Abdominal   Peds negative pediatric ROS (+)  Hematology negative hematology ROS (+)   Anesthesia Other Findings   Reproductive/Obstetrics negative OB ROS                             Anesthesia Physical Anesthesia Plan  ASA: III  Anesthesia Plan: General   Post-op Pain Management:    Induction: Intravenous  Airway Management Planned: Oral ETT  Additional Equipment:   Intra-op Plan:   Post-operative Plan: Extubation in OR  Informed Consent: I have reviewed the patients History and Physical, chart, labs and discussed the procedure including the risks, benefits and alternatives for the proposed anesthesia with the patient or authorized representative who has indicated his/her understanding and acceptance.   Dental advisory given  Plan Discussed with: CRNA and Surgeon  Anesthesia Plan Comments:         Anesthesia Quick Evaluation

## 2016-08-09 NOTE — Transfer of Care (Signed)
Immediate Anesthesia Transfer of Care Note  Patient: Kimberly Ferrell  Procedure(s) Performed: Procedure(s) with comments: RIGHT UNICOMPARTMENTAL KNEE (Right) - requests 1hr; Adductor Block  Patient Location: PACU  Anesthesia Type:GA combined with regional for post-op pain  Level of Consciousness:  sedated, patient cooperative and responds to stimulation  Airway & Oxygen Therapy:Patient Spontanous Breathing and Patient connected to face mask oxgen  Post-op Assessment:  Report given to PACU RN and Post -op Vital signs reviewed and stable  Post vital signs:  Reviewed and stable  Last Vitals:  Vitals:   08/09/16 0820 08/09/16 0822  BP:    Pulse: (!) 56 (!) 55  Resp: 13 (!) 9  Temp:      Complications: No apparent anesthesia complications

## 2016-08-09 NOTE — Evaluation (Signed)
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  Pt should progress to dc home with family assist.    Follow Up Recommendations Home health PT    Equipment Recommendations  None recommended by PT    Recommendations for Other Services       Precautions / Restrictions Precautions Precautions: Knee;Fall Restrictions Weight Bearing Restrictions: No Other Position/Activity Restrictions: WBAT      Mobility  Bed Mobility Overal bed mobility: Needs Assistance Bed Mobility: Supine to Sit     Supine to sit: Min assist;Mod assist     General bed mobility comments: cues for sequence and use of L LE to self assist  Transfers Overall transfer level: Needs assistance Equipment used: Rolling walker (2 wheeled) Transfers: Sit to/from Stand Sit to Stand: Min assist;Mod assist         General transfer comment: cues for LE management and use of UEs to self assist  Ambulation/Gait Ambulation/Gait assistance: Min assist;Mod assist Ambulation Distance (Feet): 35 Feet Assistive device: Rolling walker (2 wheeled) Gait Pattern/deviations: Step-to pattern;Decreased step length - right;Decreased step length - left;Shuffle;Trunk flexed Gait velocity: decr Gait velocity interpretation: Below normal speed for age/gender General Gait Details: cues for sequence, posture and position from [employer]            Wheelchair Mobility    Modified Rankin (Stroke Patients Only)       Balance                                             Pertinent Vitals/Pain Pain Assessment: 0-10 Pain Score: 4  Pain Location: R knee Pain Descriptors / Indicators: Aching;Sore Pain Intervention(s): Limited  activity within patient's tolerance;Monitored during session;Premedicated before session    [place] expects to be discharged to:: Private residence Living Arrangements: Spouse/significant other Available Help at Discharge: Family Type of Home: House Home Access: Stairs to enter Entrance Stairs-Rails: Right Entrance Stairs-Number of Steps: 2 stairs with no rail vs 7 stairs with single rail Home Layout: One level Home Equipment: Environmental consultant - 2 wheels      Prior Function Level of Independence: Independent               Hand Dominance        Extremity/Trunk Assessment   Upper Extremity Assessment Upper Extremity Assessment: Overall WFL for tasks assessed    Lower Extremity Assessment Lower Extremity Assessment: LLE deficits/detail    Cervical / Trunk Assessment Cervical / Trunk Assessment: Normal  Communication   Communication: No difficulties  Cognition Arousal/Alertness: Awake/alert Behavior During Therapy: WFL for tasks assessed/performed Overall Cognitive Status: Within Functional Limits for tasks assessed                                        General Comments      Exercises     Assessment/Plan    PT Assessment Patient needs continued PT services  PT Problem List Decreased strength;Decreased range of motion;Decreased activity tolerance;Decreased mobility;Decreased knowledge of use of DME;Pain;Obesity  PT Treatment Interventions DME instruction;Gait training;Stair training;Functional mobility training;Therapeutic activities;Therapeutic exercise;Patient/family education    PT Goals (Current goals can be found in the Care Plan section)  Acute Rehab PT Goals Patient Stated Goal: Regain IND and walk without pain PT Goal Formulation: With patient Time For Goal Achievement: 08/12/16 Potential to Achieve Goals: Good    Frequency 7X/week   Barriers to discharge        Co-evaluation               End of  Session   Activity Tolerance: Patient tolerated treatment well;Other (comment) (N/V) Patient left: in chair;with call bell/phone within reach;with family/visitor present Nurse Communication: Mobility status PT Visit Diagnosis: Difficulty in walking, not elsewhere classified (R26.2)    Time: 6759-1638 PT Time Calculation (min) (ACUTE ONLY): 30 min   Charges:   PT Evaluation $PT Eval Low Complexity: 1 Procedure PT Treatments $Gait Training: 8-22 mins   PT G Codes:   PT G-Codes **NOT FOR INPATIENT CLASS** Functional Assessment Tool Used: Clinical judgement Functional Limitation: Mobility: Walking and moving around Mobility: Walking and Moving Around Current Status (G6659): At least 20 percent but less than 40 percent impaired, limited or restricted Mobility: Walking and Moving Around Goal Status [phone]): At least 1 percent but less than 20 percent impaired, limited or restricted     [facility] 08/09/2016, 6:17 PM

## 2016-08-09 NOTE — Op Note (Signed)
OPERATIVE REPORT-UNICOMPARTMENTAL ARTHROPLASTY  PREOPERATIVE DIAGNOSIS: Medial compartment osteoarthritis, Right knee  POSTOPERATIVE DIAGNOSIS: Medial compartment osteoarthritis, Right knee  PROCEDURE:Right knee medial unicompartmental arthroplasty.   SURGEON: Gaynelle Arabian, MD   ASSISTANT: Nurse assist  ANESTHESIA:  General with adductor canal block   ESTIMATED BLOOD LOSS: Minimal.   DRAINS: Hemovac x1.   TOURNIQUET TIME:   Total Tourniquet Time Documented: Thigh (Right) - 40 minutes Total: Thigh (Right) - 40 minutes    COMPLICATIONS: None.   CONDITION: Stable to recovery.   BRIEF CLINICAL NOTE:Kimberly Ferrell is a 76 y.o. female, who has  significant isolated medial compartment arthritis of the Right knee. The patient has had nonoperative management including injections of cortisone and viscous supplements. Unfortunately, the pain persists.  Radiograph showed isolated medial compartment bone-on-bone arthritis  with normal-appearing patellofemoral and lateral compartments. The patient presents now for left knee unicompartmental arthroplasty.   PROCEDURE IN DETAIL: After successful administration of  Adductor canal block and general anesthetic, a tourniquet was placed high on the  Right thigh and the Right lower extremity prepped and draped in usual sterile fashion. Extremity was wrapped in an Esmarch, knee flexed, and tourniquet inflated to 300 mmHg.       A midline incision was made with a 10 blade through subcutaneous  tissue to the extensor mechanism. A fresh blade was used to make a  medial parapatellar arthrotomy. Soft tissue on the proximal medial  tibia subperiosteally elevated to the joint line with a knife and into  the semimembranosus bursa with a Cobb elevator. The patella was  subluxed laterally, and the knee flexed 90 degrees. The ACL was intact.  The marginal osteophytes on the medial femur and tibia were removed with  a rongeur. The medial  meniscus was also removed. The femoral cutting  block for the conformis unicompartmental knee system was placed along  the femur. There was excellent fit. I traced the outline. We then  removed any remaining cartilage within this outline. We then placed the  cutting block again and pinned in position. The posterior femoral cut  was made, it was approximately 5 mm. The lug holes for the femoral  component were then drilled through the cutting block. The cutting  block was subsequently removed. We then utilized the high speed burr to  create a small trough at the superior aspect of the component tomake it inset and would not overhang the cartilage. The trial was placed,  it had excellent fit. The trial was subsequently removed.       The trial was placed again and the B chip was placed. There was  excellent balance throughout full motion. Also with excellent fit on  the tibia. This was removed as was the femoral trial. A curette was  used to remove any remaining cartilage from the tibia. The tibial  cutting block was then placed and there was a perfect fit on the tibial  surface. The appropriate slope was placed and it was pinned in  position. The reciprocating saw was used to make the central cut and  then the oscillating saw used to make the horizontal cut. The bone  fragment was then removed. The tibial trial was placed and had perfect  fit on the tibia. We then drilled the 2 lug holes and did the keel punch.  We then placed tibia trial and femur trial, and a 6 mm trial insert. There was  excellent stability throughout full range of motion and no impingement.  The trial was  then removed. We drilled small holes in the distal  femur in order to create more conduits for the cement. The cut bone  surfaces were thoroughly irrigated with pulsatile lavage while the  cement was mixed on the back table. We then cemented the tibial  component into place, impacted it and removed the extruded cement.  The  same was done for the femoral component. Trial 6-mm inserts placed,  knee held in full extension, and all extruded cement removed. While the  cement was hardening, I injected the extensor mechanism, periosteum of  the femur and subcu tissues, a total of 20 mL of Exparel mixed with 30  mL of saline and then did an additional injection of 20 mL of 0.25%  Marcaine into the same tissues. When the cement had fully hardened,  then the permanent polyethylene was placed in tibial tray. There was  excellent stability throughout full range of motion with no lift off the  component and no evidence of any impingement.       Wound was copiously irrigated with saline solution, and the arthrotomy closed over a Hemovac drain with a running #1 V-Loc suture. The subcutaneous was closed with  interrupted 2-0 Vicryl and subcuticular running 4-0 Monocryl. The drain  was hooked to suction. Incision cleaned and dried and Steri-Strips and  a bulky sterile dressing applied. The tourniquet was released after a  total time of 40 minutes. This was done after closing the extensor  mechanism. The wound was closed and a bulky sterile dressing was  applied. The operative limb was placed into a knee immobilizer, and the patient awakened and transported to recovery room in stable condition.       Please note that a surgical assistant was a medical necessity for this  procedure in order to perform it in a safe and expeditious manner.  Assistance was necessary for retracting vital ligaments, neurovascular  structures, as well as for proper positioning of the limb to allow for  appropriate bone cuts and appropriate placement of the prosthesis.    Dione Plover Kimberly Freund, MD

## 2016-08-09 NOTE — Anesthesia Postprocedure Evaluation (Signed)
Anesthesia Post Note  Patient: Kimberly Ferrell  Procedure(s) Performed: Procedure(s) (LRB): RIGHT UNICOMPARTMENTAL KNEE (Right)  Patient location during evaluation: PACU Anesthesia Type: General and Regional Level of consciousness: awake and alert Pain management: pain level controlled Vital Signs Assessment: post-procedure vital signs reviewed and stable Respiratory status: spontaneous breathing, nonlabored ventilation, respiratory function stable and patient connected to nasal cannula oxygen Cardiovascular status: blood pressure returned to baseline and stable Postop Assessment: no signs of nausea or vomiting Anesthetic complications: no       Last Vitals:  Vitals:   08/09/16 1015 08/09/16 1030  BP: 139/74 (!) 147/77  Pulse: (!) 59 (!) 57  Resp: 10 12  Temp:      Last Pain:  Vitals:   08/09/16 1015  TempSrc:   PainSc: 5                  Albaraa Swingle S

## 2016-08-09 NOTE — Anesthesia Procedure Notes (Signed)
Procedure Name: Intubation Date/Time: 08/09/2016 8:32 AM Performed by: Belia Febo, Virgel Gess Pre-anesthesia Checklist: Patient identified, Emergency Drugs available, Suction available, Patient being monitored and Timeout performed Patient Re-evaluated:Patient Re-evaluated prior to inductionOxygen Delivery Method: Circle system utilized Preoxygenation: Pre-oxygenation with 100% oxygen Intubation Type: IV induction Ventilation: Mask ventilation without difficulty Laryngoscope Size: Mac and 4 Grade View: Grade I Tube type: Oral Tube size: 7.5 mm Number of attempts: 1 Airway Equipment and Method: Stylet Placement Confirmation: ETT inserted through vocal cords under direct vision,  positive ETCO2,  CO2 detector and breath sounds checked- equal and bilateral Secured at: 21 cm Tube secured with: Tape Dental Injury: Teeth and Oropharynx as per pre-operative assessment

## 2016-08-10 DIAGNOSIS — M1711 Unilateral primary osteoarthritis, right knee: Secondary | ICD-10-CM | POA: Diagnosis not present

## 2016-08-10 DIAGNOSIS — Z96652 Presence of left artificial knee joint: Secondary | ICD-10-CM | POA: Diagnosis not present

## 2016-08-10 DIAGNOSIS — Z8673 Personal history of transient ischemic attack (TIA), and cerebral infarction without residual deficits: Secondary | ICD-10-CM | POA: Diagnosis not present

## 2016-08-10 DIAGNOSIS — I10 Essential (primary) hypertension: Secondary | ICD-10-CM | POA: Diagnosis not present

## 2016-08-10 DIAGNOSIS — L719 Rosacea, unspecified: Secondary | ICD-10-CM | POA: Diagnosis not present

## 2016-08-10 DIAGNOSIS — K219 Gastro-esophageal reflux disease without esophagitis: Secondary | ICD-10-CM | POA: Diagnosis not present

## 2016-08-10 DIAGNOSIS — Z85828 Personal history of other malignant neoplasm of skin: Secondary | ICD-10-CM | POA: Diagnosis not present

## 2016-08-10 DIAGNOSIS — Z79899 Other long term (current) drug therapy: Secondary | ICD-10-CM | POA: Diagnosis not present

## 2016-08-10 LAB — BASIC METABOLIC PANEL
ANION GAP: 9 (ref 5–15)
BUN: 11 mg/dL (ref 6–20)
CALCIUM: 8.9 mg/dL (ref 8.9–10.3)
CO2: 26 mmol/L (ref 22–32)
Chloride: 103 mmol/L (ref 101–111)
Creatinine, Ser: 0.65 mg/dL (ref 0.44–1.00)
GLUCOSE: 145 mg/dL — AB (ref 65–99)
POTASSIUM: 4.1 mmol/L (ref 3.5–5.1)
Sodium: 138 mmol/L (ref 135–145)

## 2016-08-10 LAB — CBC
HEMATOCRIT: 40 % (ref 36.0–46.0)
Hemoglobin: 13.5 g/dL (ref 12.0–15.0)
MCH: 31.5 pg (ref 26.0–34.0)
MCHC: 33.8 g/dL (ref 30.0–36.0)
MCV: 93.2 fL (ref 78.0–100.0)
PLATELETS: 158 10*3/uL (ref 150–400)
RBC: 4.29 MIL/uL (ref 3.87–5.11)
RDW: 13.7 % (ref 11.5–15.5)
WBC: 10.8 10*3/uL — AB (ref 4.0–10.5)

## 2016-08-10 MED ORDER — ALUM & MAG HYDROXIDE-SIMETH 200-200-20 MG/5ML PO SUSP
30.0000 mL | Freq: Four times a day (QID) | ORAL | Status: DC | PRN
  Administered 2016-08-10: 13:00:00 30 mL via ORAL
  Filled 2016-08-10: qty 30

## 2016-08-10 MED ORDER — OXYCODONE HCL 5 MG PO TABS: 5.0000 mg | ORAL_TABLET | ORAL | 0 refills | Status: DC | PRN

## 2016-08-10 MED ORDER — PANTOPRAZOLE SODIUM 40 MG PO TBEC: 40.0000 mg | DELAYED_RELEASE_TABLET | Freq: Every day | ORAL | Status: DC

## 2016-08-10 MED ORDER — METHOCARBAMOL 500 MG PO TABS: 500.0000 mg | ORAL_TABLET | Freq: Four times a day (QID) | ORAL | 0 refills | Status: DC | PRN

## 2016-08-10 MED ORDER — RIVAROXABAN 10 MG PO TABS: 10.0000 mg | ORAL_TABLET | Freq: Every day | ORAL | 0 refills | Status: DC

## 2016-08-10 MED ORDER — TRAMADOL HCL 50 MG PO TABS: 50.0000 mg | ORAL_TABLET | Freq: Four times a day (QID) | ORAL | 0 refills | Status: DC | PRN

## 2016-08-10 NOTE — Discharge Instructions (Addendum)
° °Dr. Frank Aluisio °Total Joint Specialist °[facility] °[address] °[place], [place] [address] °[phone] ° °UNI KNEE REPLACEMENT POSTOPERATIVE DIRECTIONS ° ° °Knee Rehabilitation, Guidelines Following Surgery  °Results after knee surgery are often greatly improved when you follow the exercise, range of motion and muscle strengthening exercises prescribed by your doctor. Safety measures are also important to protect the knee from further injury. Any time any of these exercises cause you to have increased pain or swelling in your knee joint, decrease the amount until you are comfortable again and slowly increase them. If you have problems or questions, call your caregiver or physical therapist for advice.  ° °HOME CARE INSTRUCTIONS  °Remove items at home which could result in a fall. This includes throw rugs or furniture in walking pathways.  °· ICE to the affected knee every three hours for 30 minutes at a time and then as needed for pain and swelling.  Continue to use ice on the knee for pain and swelling from surgery. You may notice swelling that will progress down to the foot and ankle.  This is normal after surgery.  Elevate the leg when you are not up walking on it.   °· Continue to use the breathing machine which will help keep your temperature down.  It is common for your temperature to cycle up and down following surgery, especially at night when you are not up moving around and exerting yourself.  The breathing machine keeps your lungs expanded and your temperature down. °· Do not place pillow under knee, focus on keeping the knee straight while resting ° °DIET °You may resume your previous home diet once your are discharged from the hospital. ° °DRESSING / WOUND CARE / SHOWERING °You may shower 3 days after surgery, but keep the wounds dry during showering.  You may use an occlusive plastic wrap (Press'n Seal for example), NO SOAKING/SUBMERGING IN THE BATHTUB.  If the  bandage gets wet, change with a clean dry gauze.  If the incision gets wet, pat the wound dry with a clean towel. °You may start showering once you are discharged home but do not submerge the incision under water. Just pat the incision dry and apply a dry gauze dressing on daily. °Change the surgical dressing daily and reapply a dry dressing each time. ° °ACTIVITY °Walk with your walker as instructed. °Use walker as long as suggested by your caregivers. °Avoid periods of inactivity such as sitting longer than an hour when not asleep. This helps prevent blood clots.  °You may resume a sexual relationship in one month or when given the OK by your doctor.  °You may return to work once you are cleared by your doctor.  °Do not drive a car for 6 weeks or until released by you surgeon.  °Do not drive while taking narcotics. ° °WEIGHT BEARING °Weight bearing as tolerated with assist device (walker, cane, etc) as directed, use it as long as suggested by your surgeon or therapist, typically at least 4-6 weeks. ° °POSTOPERATIVE CONSTIPATION PROTOCOL °Constipation - defined medically as fewer than three stools per week and severe constipation as less than one stool per week. ° °One of the most common issues patients have following surgery is constipation.  Even if you have a regular bowel pattern at home, your normal regimen is likely to be disrupted due to multiple reasons following surgery.  Combination of anesthesia, postoperative narcotics, change in appetite and fluid intake all can affect your bowels.    In order to avoid complications following surgery, here are some recommendations in order to help you during your recovery period.  Colace (docusate) - Pick up an over-the-counter form of Colace or another stool softener and take twice a day as long as you are requiring postoperative pain medications.  Take with a full glass of water daily.  If you experience loose stools or diarrhea, hold the colace until you stool forms  back up.  If your symptoms do not get better within 1 week or if they get worse, check with your doctor.  Dulcolax (bisacodyl) - Pick up over-the-counter and take as directed by the product packaging as needed to assist with the movement of your bowels.  Take with a full glass of water.  Use this product as needed if not relieved by Colace only.   MiraLax (polyethylene glycol) - Pick up over-the-counter to have on hand.  MiraLax is a solution that will increase the amount of water in your bowels to assist with bowel movements.  Take as directed and can mix with a glass of water, juice, soda, coffee, or tea.  Take if you go more than two days without a movement. Do not use MiraLax more than once per day. Call your doctor if you are still constipated or irregular after using this medication for 7 days in a row.  If you continue to have problems with postoperative constipation, please contact the office for further assistance and recommendations.  If you experience "the worst abdominal pain ever" or develop nausea or vomiting, please contact the office immediatly for further recommendations for treatment.  ITCHING  If you experience itching with your medications, try taking only a single pain pill, or even half a pain pill at a time.  You can also use Benadryl over the counter for itching or also to help with sleep.   TED HOSE STOCKINGS Wear the elastic stockings on both legs for three weeks following surgery during the day but you may remove then at night for sleeping.  MEDICATIONS See your medication summary on the After Visit Summary that the nursing staff will review with you prior to discharge.  You may have some home medications which will be placed on hold until you complete the course of blood thinner medication.  It is important for you to complete the blood thinner medication as prescribed by your surgeon.  Continue your approved medications as instructed at time of  discharge.  PRECAUTIONS If you experience chest pain or shortness of breath - call 911 immediately for transfer to the hospital emergency department.  If you develop a fever greater that 101 F, purulent drainage from wound, increased redness or drainage from wound, foul odor from the wound/dressing, or calf pain - CONTACT YOUR SURGEON.                                                   FOLLOW-UP APPOINTMENTS Make sure you keep all of your appointments after your operation with your surgeon and caregivers. You should call the office at the above phone number and make an appointment for approximately two weeks after the date of your surgery or on the date instructed by your surgeon outlined in the "After Visit Summary".  RANGE OF MOTION AND STRENGTHENING EXERCISES  Rehabilitation of the knee is important following a knee injury or an  operation. After just a few days of immobilization, the muscles of the thigh which control the knee become weakened and shrink (atrophy). Knee exercises are designed to build up the tone and strength of the thigh muscles and to improve knee motion. Often times heat used for twenty to thirty minutes before working out will loosen up your tissues and help with improving the range of motion but do not use heat for the first two weeks following surgery. These exercises can be done on a training (exercise) mat, on the floor, on a table or on a bed. Use what ever works the best and is most comfortable for you Knee exercises include:  Leg Lifts - While your knee is still immobilized in a splint or cast, you can do straight leg raises. Lift the leg to 60 degrees, hold for 3 sec, and slowly lower the leg. Repeat 10-20 times 2-3 times daily. Perform this exercise against resistance later as your knee gets better.  Quad and Hamstring Sets - Tighten up the muscle on the front of the thigh (Quad) and hold for 5-10 sec. Repeat this 10-20 times hourly. Hamstring sets are done by pushing the  foot backward against an object and holding for 5-10 sec. Repeat as with quad sets.   Leg Slides: Lying on your back, slowly slide your foot toward your buttocks, bending your knee up off the floor (only go as far as is comfortable). Then slowly slide your foot back down until your leg is flat on the floor again.  Angel Wings: Lying on your back spread your legs to the side as far apart as you can without causing discomfort.  A rehabilitation program following serious knee injuries can speed recovery and prevent re-injury in the future due to weakened muscles. Contact your doctor or a physical therapist for more information on knee rehabilitation.   IF YOU ARE TRANSFERRED TO A SKILLED REHAB FACILITY If the patient is transferred to a skilled rehab facility following release from the hospital, a list of the current medications will be sent to the facility for the patient to continue.  When discharged from the skilled rehab facility, please have the facility set up the patient's [place] prior to being released. Also, the skilled facility will be responsible for providing the patient with their medications at time of release from the facility to include their pain medication, the muscle relaxants, and their blood thinner medication. If the patient is still at the rehab facility at time of the two week follow up appointment, the skilled rehab facility will also need to assist the patient in arranging follow up appointment in our office and any transportation needs.  MAKE SURE YOU:  Understand these instructions.  Get help right away if you are not doing well or get worse.    Pick up stool softner and laxative for home use following surgery while on pain medications. Do not submerge incision under water. Please use good hand washing techniques while changing dressing each day. May shower starting three days after surgery. Please use a clean towel to pat the incision dry following  showers. Continue to use ice for pain and swelling after surgery. Do not use any lotions or creams on the incision until instructed by your surgeon.   Take Xarelto 10 mg daily for ten days, then change to Aspirin 325 mg daily for two weeks, then resume the Baby Aspirin 81 mg daily at home.   Information on my medicine - XARELTO (  Rivaroxaban)  This medication education was reviewed with me or my healthcare representative as part of my discharge preparation.  The pharmacist that spoke with me during my hospital stay was:  Tommie Raymond, Student-PharmD  Why was Xarelto prescribed for you? Xarelto was prescribed for you to reduce the risk of blood clots forming after orthopedic surgery. The medical term for these abnormal blood clots is venous thromboembolism (VTE).  What do you need to know about xarelto ? Take your Xarelto ONCE DAILY at the same time every day. You may take it either with or without food.  If you have difficulty swallowing the tablet whole, you may crush it and mix in applesauce just prior to taking your dose.  Take Xarelto exactly as prescribed by your doctor and DO NOT stop taking Xarelto without talking to the doctor who prescribed the medication.  Stopping without other VTE prevention medication to take the place of Xarelto may increase your risk of developing a clot.  After discharge, you should have regular check-up appointments with your healthcare provider that is prescribing your Xarelto.    What do you do if you miss a dose? If you miss a dose, take it as soon as you remember on the same day then continue your regularly scheduled once daily regimen the next day. Do not take two doses of Xarelto on the same day.   Important Safety Information A possible side effect of Xarelto is bleeding. You should call your healthcare provider right away if you experience any of the following: ? Bleeding from an injury or your nose that does not stop. ? Unusual  colored urine (red or dark brown) or unusual colored stools (red or black). ? Unusual bruising for unknown reasons. ? A serious fall or if you hit your head (even if there is no bleeding).  Some medicines may interact with Xarelto and might increase your risk of bleeding while on Xarelto. To help avoid this, consult your healthcare provider or pharmacist prior to using any new prescription or non-prescription medications, including herbals, vitamins, non-steroidal anti-inflammatory drugs (NSAIDs) and supplements.  This website has more information on Xarelto: [email or web address]

## 2016-08-10 NOTE — Progress Notes (Signed)
Subjective: 1 Day Post-Op Procedure(s) (LRB): RIGHT UNICOMPARTMENTAL KNEE (Right) Patient reports pain as mild.   Patient seen in rounds by Dr. Wynelle Link. Patient is well, but has had some minor complaints of pain in the knee, requiring pain medications Patient is ready to go home today.  Objective: Vital signs in last 24 hours: Temp:  [97.4 F (36.3 C)-98.2 F (36.8 C)] 98.2 F (36.8 C) (03/29 0606) Pulse Rate:  [51-65] 63 (03/29 0606) Resp:  [0-22] 16 (03/29 0606) BP: (134-163)/(51-80) 142/51 (03/29 0606) SpO2:  [92 %-98 %] 92 % (03/29 0606) Weight:  [115.2 kg (254 lb)] 115.2 kg (254 lb) (03/28 0740)  Intake/Output from previous day:  Intake/Output Summary (Last 24 hours) at 08/10/16 0714 Last data filed at 08/10/16 0607  Gross per 24 hour  Intake          3101.25 ml  Output              885 ml  Net          2216.25 ml    Intake/Output this shift: No intake/output data recorded.  Labs:  Recent Labs  08/10/16 0415  HGB 13.5    Recent Labs  08/10/16 0415  WBC 10.8*  RBC 4.29  HCT 40.0  PLT 158    Recent Labs  08/10/16 0415  NA 138  K 4.1  CL 103  CO2 26  BUN 11  CREATININE 0.65  GLUCOSE 145*  CALCIUM 8.9   No results for input(s): LABPT, INR in the last 72 hours.  EXAM: General - Patient is Alert, Appropriate and Oriented Extremity - Neurovascular intact Sensation intact distally Intact pulses distally Dorsiflexion/Plantar flexion intact Dressing - clean, dry Motor Function - intact, moving foot and toes well on exam.  Hemovac pulled without difficulty.  Assessment/Plan: 1 Day Post-Op Procedure(s) (LRB): RIGHT UNICOMPARTMENTAL KNEE (Right) Procedure(s) (LRB): RIGHT UNICOMPARTMENTAL KNEE (Right) Past Medical History:  Diagnosis Date  . Arthritis   . Cancer ([place])    skin cancer removed from right forehead  . Chicken pox as child  . Colon polyps   . Complication of anesthesia    "hard to wake up"  . GERD (gastroesophageal reflux  disease)    occasional  . Hemorrhoids    "sometimes"  . Hypertension   . Lung nodule seen on imaging study    noted 8/13- needs CT f/u in 1 year  . Mononucleosis   . Pneumonia    hx of  . PONV (postoperative nausea and vomiting)   . Rosacea   . TIA (transient ischemic attack) 2007   from IV dye, no residual, passed out and was told by RN that it was TIA   Active Problems:   OA (osteoarthritis) of knee  Estimated body mass index is 41 kg/m as calculated from the following:   Height as of this encounter: 5\' 6"  (1.676 m).   Weight as of this encounter: 115.2 kg (254 lb). Up with therapy Diet - Cardiac diet Follow up - in 2 weeks Activity - WBAT Dressing - May remove the surgical dressing tomorrow at home and then apply a dry gauze dressing daily. May shower three days following surgery but do not submerge the incision under water. Disposition - Home Condition Upon Discharge - Good D/C Meds - See DC Summary DVT Prophylaxis Xarelto 10 mg daily for ten days, then change to Aspirin 325 mg daily for two weeks, then resume the Baby Aspirin 81 mg daily at home.  Arlee Muslim,  PA-C Orthopaedic Surgery 08/10/2016, 7:14 AM

## 2016-08-10 NOTE — Progress Notes (Signed)
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   Follow Up Recommendations  Home health PT     Equipment Recommendations  None recommended by PT    Recommendations for Other Services       Precautions / Restrictions Precautions Precautions: Knee;Fall Restrictions Weight Bearing Restrictions: No Other Position/Activity Restrictions: WBAT    Mobility  Bed Mobility Overal bed mobility: Needs Assistance Bed Mobility: Supine to Sit     Supine to sit: Min guard     General bed mobility comments: increased time with cues for use of L LE to self assist  Transfers Overall transfer level: Needs assistance Equipment used: Rolling walker (2 wheeled) Transfers: Sit to/from Stand Sit to Stand: Supervision         General transfer comment: cues for LE management and use of UEs to self assist  Ambulation/Gait Ambulation/Gait assistance: Min guard;Supervision Ambulation Distance (Feet): 120 Feet Assistive device: Rolling walker (2 wheeled) Gait Pattern/deviations: Step-to pattern;Decreased step length - right;Decreased step length - left;Shuffle;Trunk flexed Gait velocity: decr Gait velocity interpretation: Below normal speed for age/gender General Gait Details: cues for sequence, posture and position from [employer]            Wheelchair Mobility    Modified Rankin (Stroke Patients Only)       Balance                                            Cognition Arousal/Alertness: Awake/alert Behavior During Therapy: WFL for tasks assessed/performed Overall Cognitive Status: Within Functional Limits for tasks assessed                                        Exercises Total Joint Exercises Ankle  Circles/Pumps: AROM;Both;15 reps;Supine Quad Sets: AAROM;Both;15 reps;Supine Heel Slides: AAROM;Right;15 reps;Supine Straight Leg Raises: AROM;Right;15 reps;Supine Goniometric ROM: AAROM R knee -10 - 45    General Comments        Pertinent Vitals/Pain Pain Assessment: 0-10 Pain Score: 6  Faces Pain Scale: Hurts a little bit Pain Location: R knee Pain Descriptors / Indicators: Aching;Sore Pain Intervention(s): Limited activity within patient's tolerance;Monitored during session;Premedicated before session;Ice applied    Home Living Family/patient expects to be discharged to:: Private residence Living Arrangements: Spouse/significant other Available Help at Discharge: Family Type of Home: House Home Access: Stairs to enter Entrance Stairs-Rails: Right   Home Equipment: Grab bars - toilet;Shower seat - built in      Prior Function Level of Independence: Independent          PT Goals (current goals can now be found in the care plan section) Acute Rehab PT Goals Patient Stated Goal: Regain IND and walk without pain PT Goal Formulation: With patient Time For Goal Achievement: 08/12/16 Potential to Achieve Goals: Good Progress towards PT goals: Progressing toward goals    Frequency    7X/week      PT Plan Current plan remains appropriate    Co-evaluation  End of Session Equipment Utilized During Treatment: Gait belt Activity Tolerance: Patient tolerated treatment well Patient left: in chair;with call bell/phone within reach;with family/visitor present Nurse Communication: Mobility status PT Visit Diagnosis: Difficulty in walking, not elsewhere classified (R26.2)     Time: 3437-3578 PT Time Calculation (min) (ACUTE ONLY): 28 min  Charges:  $Gait Training: 8-22 mins $Therapeutic Exercise: 8-22 mins                    G Codes:          Taron Conrey 08/10/2016, 12:49 PM

## 2016-08-10 NOTE — Evaluation (Signed)
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 All education was completed. No further OT is needed at this time    Follow Up Recommendations  Supervision/Assistance - 24 hour    Equipment Recommendations  None recommended by OT    Recommendations for Other Services       Precautions / Restrictions Precautions Precautions: Knee;Fall Restrictions Other Position/Activity Restrictions: WBAT      Mobility Bed Mobility               General bed mobility comments: oob  Transfers   Equipment used: Rolling walker (2 wheeled)   Sit to Stand: Min guard         General transfer comment: for safety    Balance                                           ADL either performed or assessed with clinical judgement   ADL Overall ADL's : Needs assistance/impaired     Grooming: Wash/dry Radiographer, therapeutic: Supervision/safety;Ambulation;BSC;RW   Toileting- Clothing Manipulation and Hygiene: Supervision/safety;Sit to/from stand   Tub/ Shower Transfer: Min guard;3 in 1;Ambulation     General ADL Comments: husband had helped pt with adl this am.  Educated on and practiced bathroom transfers. Reviewed knee precautions, use of ice.  Pt verbalizes understanding of all.  Based on clinical reasoning, pt needs set up for UB adls and up to mod A for LB adls.     Vision         Perception     Praxis      Pertinent Vitals/Pain Pain Assessment: Faces Faces Pain Scale: Hurts a little bit Pain Location: R knee Pain Descriptors / Indicators: Sore Pain Intervention(s): Limited activity within patient's tolerance;Monitored during session;Repositioned     Hand Dominance      Extremity/Trunk Assessment Upper Extremity Assessment Upper Extremity Assessment: Overall WFL for tasks assessed           Communication Communication Communication: No difficulties   Cognition Arousal/Alertness: Awake/alert Behavior During Therapy: WFL for tasks assessed/performed Overall Cognitive Status: Within Functional Limits for tasks assessed                                     General Comments       Exercises     Shoulder Instructions      Home Living Family/patient expects to be discharged to:: Private residence Living Arrangements: Spouse/significant other Available Help at Discharge: Family Type of Home: House Home Access: Stairs to enter [employer] of Steps: 2 stairs with no rail vs 7 stairs with single rail Entrance Stairs-Rails: Right       Bathroom Shower/Tub: Occupational psychologist: Handicapped height     Home Equipment: Grab bars - toilet;Shower seat - built in          Prior Functioning/Environment Level of Independence: Independent  OT Problem List:        OT Treatment/Interventions:      OT Goals(Current goals can be found in the care plan section) Acute Rehab OT Goals Patient Stated Goal: Regain IND and walk without pain OT Goal Formulation: All assessment and education complete, DC therapy  OT Frequency:     Barriers to D/C:            Co-evaluation              End of Session    Activity Tolerance: Patient tolerated treatment well Patient left: in chair;with call bell/phone within reach;with family/visitor present  OT Visit Diagnosis: Pain Pain - Right/Left: Right Pain - part of body: Knee                Time: 0943-1000 OT Time Calculation (min): 17 min Charges:  OT General Charges $OT Visit: 1 Procedure OT Evaluation $OT Eval Low Complexity: 1 Procedure G-Codes: OT G-codes **NOT FOR INPATIENT CLASS** Functional Assessment Tool Used: AM-PAC 6  Clicks Daily Activity;Clinical judgement Functional Limitation: Self care Self Care Current Status (Q5956): At least 40 percent but less than 60 percent impaired, limited or restricted Self Care Goal Status (L8756): At least 40 percent but less than 60 percent impaired, limited or restricted Self Care Discharge Status [phone]): At least 40 percent but less than 60 percent impaired, limited or restricted   Lesle Chris, OTR/L [phone] 08/10/2016  Kimberly Ferrell 08/10/2016, 10:43 AM

## 2016-08-10 NOTE — Care Management Obs Status (Signed)
[place] NOTIFICATION   Patient Details  Name: Kimberly Ferrell MRN: [id number] Date of Birth: 1941/03/02   Medicare Observation Status Notification Given:  Yes    CrutchfieldAntony Haste, RN 08/10/2016, 9:03 AM

## 2016-08-10 NOTE — Care Management Note (Signed)
Case Management Note  Patient Details  Name: Kimberly Ferrell MRN: [id number] Date of Birth: 16-May-1940  Subjective/Objective:                    Action/Plan:   Expected Discharge Date:  08/10/16               Expected Discharge Plan:  Home/Self Care  In-House Referral:  NA  Discharge planning Services  CM Consult  Post Acute Care Choice:  Durable Medical Equipment, Home Health Choice offered to:  Patient  DME Arranged:  N/A DME Agency:  NA  HH Arranged:  PT [place] Agency:  [facility] (now [facility])  Status of Service:  Completed, signed off  If discussed at [employer] of Stay Meetings, dates discussed:    Additional Comments:  Delrae Sawyers, RN 08/10/2016, 9:03 AM

## 2016-08-10 NOTE — Care Management Note (Signed)
Case Management Note  Patient Details  Name: Kimberly Ferrell MRN: [id number] Date of Birth: 07/13/1940  Subjective/Objective:   76 y.o. F admitted 08/09/2016 for L Uni Knee. To be discharged home with husband with No DME or follow up therapy                  Action/Plan:CM will sign off for now but will be available should additional discharge needs arise or disposition change.    Expected Discharge Date:  08/10/16               Expected Discharge Plan:  Home/Self Care  In-House Referral:  NA  Discharge planning Services  CM Consult  Post Acute Care Choice:  Durable Medical Equipment, Home Health Choice offered to:  Patient  DME Arranged:  N/A DME Agency:  NA  HH Arranged:  NA HH Agency:  [facility] (now [facility])  Status of Service:  Completed, signed off  If discussed at [employer] of Stay Meetings, dates discussed:    Additional Comments:  Delrae Sawyers, RN 08/10/2016, 7:34 AM

## 2016-08-10 NOTE — Progress Notes (Signed)
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  Reviewed stairs and home therex.   Follow Up Recommendations  Home health PT     Equipment Recommendations  None recommended by PT    Recommendations for Other Services       Precautions / Restrictions Precautions Precautions: Knee;Fall Restrictions Weight Bearing Restrictions: No Other Position/Activity Restrictions: WBAT    Mobility  Bed Mobility Overal bed mobility: Needs Assistance Bed Mobility: Supine to Sit     Supine to sit: Min guard     General bed mobility comments: increased time with cues for use of L LE to self assist  Transfers Overall transfer level: Needs assistance Equipment used: Rolling walker (2 wheeled) Transfers: Sit to/from Stand Sit to Stand: Supervision         General transfer comment: cues for LE management and use of UEs to self assist  Ambulation/Gait Ambulation/Gait assistance: Min guard;Supervision Ambulation Distance (Feet): 40 Feet Assistive device: Rolling walker (2 wheeled) Gait Pattern/deviations: Step-to pattern;Decreased step length - right;Decreased step length - left;Shuffle;Trunk flexed Gait velocity: decr Gait velocity interpretation: Below normal speed for age/gender General Gait Details: cues for sequence, posture and position from RW   Stairs Stairs: Yes   Stair Management: No rails;Step to pattern;Backwards;With walker Number of Stairs: 4 General stair comments: 2 stairs twice with cues for sequence and foot placement.  Spouse present, written instructions provided.  Wheelchair Mobility    Modified Rankin (Stroke Patients Only)       Balance                                            Cognition Arousal/Alertness: Awake/alert Behavior During  Therapy: WFL for tasks assessed/performed Overall Cognitive Status: Within Functional Limits for tasks assessed                                        Exercises Total Joint Exercises Ankle Circles/Pumps: AROM;Both;15 reps;Supine Quad Sets: AAROM;Both;15 reps;Supine Heel Slides: AAROM;Right;15 reps;Supine Straight Leg Raises: AROM;Right;15 reps;Supine Goniometric ROM: AAROM R knee -10 - 45    General Comments        Pertinent Vitals/Pain Pain Assessment: 0-10 Pain Score: 3  Faces Pain Scale: Hurts a little bit Pain Location: R knee Pain Descriptors / Indicators: Aching;Sore Pain Intervention(s): Limited activity within patient's tolerance;Monitored during session;Premedicated before session    [place] expects to be discharged to:: Private residence Living Arrangements: Spouse/significant other Available Help at Discharge: Family Type of Home: House Home Access: Stairs to enter Entrance Stairs-Rails: Right   Home Equipment: Grab bars - toilet;Shower seat - built in      Prior Function Level of Independence: Independent          PT Goals (current goals can now be found in the care plan section) Acute Rehab PT Goals Patient Stated Goal: Regain IND and walk without pain PT Goal Formulation: With patient Time For Goal Achievement: 08/12/16 Potential to Achieve Goals: Good Progress towards PT goals: Progressing toward goals    Frequency    7X/week  PT Plan Current plan remains appropriate    Co-evaluation             End of Session Equipment Utilized During Treatment: Gait belt Activity Tolerance: Patient tolerated treatment well Patient left: in chair;with call bell/phone within reach;with family/visitor present Nurse Communication: Mobility status PT Visit Diagnosis: Difficulty in walking, not elsewhere classified (R26.2)     Time: 2111-7356 PT Time Calculation (min) (ACUTE ONLY): 19 min  Charges:  $Gait  Training: 8-22 mins $Therapeutic Exercise: 8-22 mins                    G Codes:          Cathyann Kilfoyle 08/10/2016, 12:52 PM

## 2016-08-10 NOTE — Discharge Summary (Signed)
Physician Discharge Summary   Patient ID: Kimberly Ferrell MRN: [id number] DOB/AGE: 07/31/1940 76 y.o.  Admit date: 08/09/2016 Discharge date: 08-10-2016  Primary Diagnosis:  Medial compartment osteoarthritis, Right knee Admission Diagnoses:  Past Medical History:  Diagnosis Date  . Arthritis   . Cancer ([place])    skin cancer removed from right forehead  . Chicken pox as child  . Colon polyps   . Complication of anesthesia    "hard to wake up"  . GERD (gastroesophageal reflux disease)    occasional  . Hemorrhoids    "sometimes"  . Hypertension   . Lung nodule seen on imaging study    noted 8/13- needs CT f/u in 1 year  . Mononucleosis   . Pneumonia    hx of  . PONV (postoperative nausea and vomiting)   . Rosacea   . TIA (transient ischemic attack) 2007   from IV dye, no residual, passed out and was told by RN that it was TIA   Discharge Diagnoses:   Active Problems:   OA (osteoarthritis) of knee  Estimated body mass index is 41 kg/m as calculated from the following:   Height as of this encounter: _0  (1.676 m).   Weight as of this encounter: 115.2 kg (254 lb).  Procedure:  Procedure(s) (LRB): RIGHT UNICOMPARTMENTAL KNEE (Right)   Consults: None  HPI: Kimberly Ferrell is a 76 y.o. female, who has  significant isolated medial compartment arthritis of the Right knee. The patient has had nonoperative management including injections of cortisone and viscous supplements. Unfortunately, the pain persists.  Radiograph showed isolated medial compartment bone-on-bone arthritis  with normal-appearing patellofemoral and lateral compartments. The patient presents now for left knee unicompartmental arthroplasty.   Laboratory Data: Admission on 08/09/2016  Component Date Value Ref Range Status  . WBC 08/10/2016 10.8* 4.0 - 10.5 K/uL Final  . RBC 08/10/2016 4.29  3.87 - 5.11 MIL/uL Final  . Hemoglobin 08/10/2016 13.5  12.0 - 15.0 g/dL Final  . HCT 08/10/2016 40.0  36.0 -  46.0 % Final  . MCV 08/10/2016 93.2  78.0 - 100.0 fL Final  . MCH 08/10/2016 31.5  26.0 - 34.0 pg Final  . MCHC 08/10/2016 33.8  30.0 - 36.0 g/dL Final  . RDW 08/10/2016 13.7  11.5 - 15.5 % Final  . Platelets 08/10/2016 158  150 - 400 K/uL Final  . Sodium 08/10/2016 138  135 - 145 mmol/L Final  . Potassium 08/10/2016 4.1  3.5 - 5.1 mmol/L Final  . Chloride 08/10/2016 103  101 - 111 mmol/L Final  . CO2 08/10/2016 26  22 - 32 mmol/L Final  . Glucose, Bld 08/10/2016 145* 65 - 99 mg/dL Final  . BUN 08/10/2016 11  6 - 20 mg/dL Final  . Creatinine, Ser 08/10/2016 0.65  0.44 - 1.00 mg/dL Final  . Calcium 08/10/2016 8.9  8.9 - 10.3 mg/dL Final  . GFR calc non Af Amer 08/10/2016 >60  >60 mL/min Final  . GFR calc Af Amer 08/10/2016 >60  >60 mL/min Final   Comment: (NOTE) The eGFR has been calculated using the CKD EPI equation. This calculation has not been validated in all clinical situations. eGFR's persistently <60 mL/min signify possible Chronic Kidney Disease.   Georgiann Hahn gap 08/10/2016 9  5 - 15 Final  Hospital Outpatient Visit on 08/01/2016  Component Date Value Ref Range Status  . aPTT 08/01/2016 33  24 - 36 seconds Final  . WBC 08/01/2016 7.4  4.0 - 10.5  K/uL Final  . RBC 08/01/2016 4.66  3.87 - 5.11 MIL/uL Final  . Hemoglobin 08/01/2016 14.3  12.0 - 15.0 g/dL Final  . HCT 08/01/2016 43.5  36.0 - 46.0 % Final  . MCV 08/01/2016 93.3  78.0 - 100.0 fL Final  . MCH 08/01/2016 30.7  26.0 - 34.0 pg Final  . MCHC 08/01/2016 32.9  30.0 - 36.0 g/dL Final  . RDW 08/01/2016 13.9  11.5 - 15.5 % Final  . Platelets 08/01/2016 176  150 - 400 K/uL Final  . Sodium 08/01/2016 140  135 - 145 mmol/L Final  . Potassium 08/01/2016 4.6  3.5 - 5.1 mmol/L Final  . Chloride 08/01/2016 106  101 - 111 mmol/L Final  . CO2 08/01/2016 28  22 - 32 mmol/L Final  . Glucose, Bld 08/01/2016 112* 65 - 99 mg/dL Final  . BUN 08/01/2016 9  6 - 20 mg/dL Final  . Creatinine, Ser 08/01/2016 0.66  0.44 - 1.00 mg/dL Final    . Calcium 08/01/2016 9.4  8.9 - 10.3 mg/dL Final  . Total Protein 08/01/2016 7.6  6.5 - 8.1 g/dL Final  . Albumin 08/01/2016 4.3  3.5 - 5.0 g/dL Final  . AST 08/01/2016 44* 15 - 41 U/L Final  . ALT 08/01/2016 42  14 - 54 U/L Final  . Alkaline Phosphatase 08/01/2016 80  38 - 126 U/L Final  . Total Bilirubin 08/01/2016 0.8  0.3 - 1.2 mg/dL Final  . GFR calc non Af Amer 08/01/2016 >60  >60 mL/min Final  . GFR calc Af Amer 08/01/2016 >60  >60 mL/min Final   Comment: (NOTE) The eGFR has been calculated using the CKD EPI equation. This calculation has not been validated in all clinical situations. eGFR's persistently <60 mL/min signify possible Chronic Kidney Disease.   . Anion gap 08/01/2016 6  5 - 15 Final  . Prothrombin Time 08/01/2016 13.8  11.4 - 15.2 seconds Final  . INR 08/01/2016 1.06   Final  . ABO/RH(D) 08/01/2016 O POS   Final  . Antibody Screen 08/01/2016 NEG   Final  . Sample Expiration 08/01/2016 08/12/2016   Final  . Extend sample reason 08/01/2016 NO TRANSFUSIONS OR PREGNANCY IN THE PAST 3 MONTHS   Final  . MRSA, PCR 08/01/2016 NEGATIVE  NEGATIVE Final  . Staphylococcus aureus 08/01/2016 NEGATIVE  NEGATIVE Final   Comment:        The Xpert SA Assay (FDA approved for NASAL specimens in patients over 57 years of age), is one component of a comprehensive surveillance program.  Test performance has been validated by [facility] for patients greater than or equal to 48 year old. It is not intended to diagnose infection nor to guide or monitor treatment.      X-Rays:No results found.  EKG: Orders placed or performed during the hospital encounter of 08/01/16  . EKG 12-Lead  . EKG 12-Lead     Hospital Course: Kimberly Ferrell is a 76 y.o. who was admitted to [facility]. They were brought to the operating room on 08/09/2016 and underwent Procedure(s): RIGHT UNICOMPARTMENTAL KNEE.  Patient tolerated the procedure well and was later transferred to the  recovery room and then to the orthopaedic floor for postoperative care.  They were given PO and IV analgesics for pain control following their surgery.  They were given 24 hours of postoperative antibiotics of  Anti-infectives    Start     Dose/Rate Route Frequency Ordered Stop   08/09/16 1500  ceFAZolin (  ANCEF) IVPB 2g/100 mL premix     2 g 200 mL/hr over 30 Minutes Intravenous Every 6 hours 08/09/16 1144 08/09/16 2135   08/09/16 0734  ceFAZolin (ANCEF) 2-4 GM/100ML-% IVPB    Comments:  Key, Kristopher   : cabinet override      08/09/16 0734 08/09/16 0845   08/09/16 0708  ceFAZolin (ANCEF) IVPB 2g/100 mL premix     2 g 200 mL/hr over 30 Minutes Intravenous On call to O.R. 08/09/16 4098 08/09/16 0845     and started on DVT prophylaxis in the form of Xarelto.   PT and OT were ordered for postop therapy protocol.  Discharge planning consulted to help with postop disposition and equipment needs.  Patient had a good night on the evening of surgery.  They started to get up OOB with therapy on day one. Hemovac drain was pulled without difficulty.  Patient was seen in rounds on day one and it was felt that as long as they did well with the remaining sessions of therapy that they would be ready to go home.  Arrangements were made and they were setup to go home on POD 1.  Diet - Cardiac diet Follow up - in 2 weeks Activity - WBAT Dressing - May remove the surgical dressing tomorrow at home and then apply a dry gauze dressing daily. May shower three days following surgery but do not submerge the incision under water. Disposition - Home Condition Upon Discharge - Good D/C Meds - See DC Summary DVT Prophylaxis Xarelto 10 mg daily for ten days, then change to Aspirin 325 mg daily for two weeks, then resume the Baby Aspirin 81 mg daily at home.   Discharge Instructions    Call MD / Call 911    Complete by:  As directed    If you experience chest pain or shortness of breath, CALL 911 and be  transported to the hospital emergency room.  If you develope a fever above 101 F, pus (white drainage) or increased drainage or redness at the wound, or calf pain, call your surgeon's office.   Change dressing    Complete by:  As directed    Change dressing daily with sterile 4 x 4 inch gauze dressing and apply TED hose. Do not submerge the incision under water.   Constipation Prevention    Complete by:  As directed    Drink plenty of fluids.  Prune juice may be helpful.  You may use a stool softener, such as Colace (over the counter) 100 mg twice a day.  Use MiraLax (over the counter) for constipation as needed.   Diet - low sodium heart healthy    Complete by:  As directed    Discharge instructions    Complete by:  As directed    Pick up stool softner and laxative for home use following surgery while on pain medications. Do not submerge incision under water. Please use good hand washing techniques while changing dressing each day. May shower starting three days after surgery. Please use a clean towel to pat the incision dry following showers. Continue to use ice for pain and swelling after surgery. Do not use any lotions or creams on the incision until instructed by your surgeon.  Wear both TED hose on both legs during the day every day for three weeks, but may have off at night at home.  Postoperative Constipation Protocol  Constipation - defined medically as fewer than three stools per week and severe constipation  as less than one stool per week.  One of the most common issues patients have following surgery is constipation.  Even if you have a regular bowel pattern at home, your normal regimen is likely to be disrupted due to multiple reasons following surgery.  Combination of anesthesia, postoperative narcotics, change in appetite and fluid intake all can affect your bowels.  In order to avoid complications following surgery, here are some recommendations in order to help you during  your recovery period.  Colace (docusate) - Pick up an over-the-counter form of Colace or another stool softener and take twice a day as long as you are requiring postoperative pain medications.  Take with a full glass of water daily.  If you experience loose stools or diarrhea, hold the colace until you stool forms back up.  If your symptoms do not get better within 1 week or if they get worse, check with your doctor.  Dulcolax (bisacodyl) - Pick up over-the-counter and take as directed by the product packaging as needed to assist with the movement of your bowels.  Take with a full glass of water.  Use this product as needed if not relieved by Colace only.   MiraLax (polyethylene glycol) - Pick up over-the-counter to have on hand.  MiraLax is a solution that will increase the amount of water in your bowels to assist with bowel movements.  Take as directed and can mix with a glass of water, juice, soda, coffee, or tea.  Take if you go more than two days without a movement. Do not use MiraLax more than once per day. Call your doctor if you are still constipated or irregular after using this medication for 7 days in a row.  If you continue to have problems with postoperative constipation, please contact the office for further assistance and recommendations.  If you experience "the worst abdominal pain ever" or develop nausea or vomiting, please contact the office immediatly for further recommendations for treatment.   Xarelto 10 mg daily for ten days, then change to Aspirin 325 mg daily for two weeks, then resume the Baby Aspirin 81 mg daily at home.    Do not put a pillow under the knee. Place it under the heel.    Complete by:  As directed    Do not sit on low chairs, stoools or toilet seats, as it may be difficult to get up from low surfaces    Complete by:  As directed    Driving restrictions    Complete by:  As directed    No driving until released by the physician.   Increase activity slowly  as tolerated    Complete by:  As directed    Lifting restrictions    Complete by:  As directed    No lifting until released by the physician.   Patient may shower    Complete by:  As directed    You may shower without a dressing once there is no drainage.  Do not wash over the wound.  If drainage remains, do not shower until drainage stops.   TED hose    Complete by:  As directed    Use stockings (TED hose) for 3 weeks on both leg(s).  You may remove them at night for sleeping.   Weight bearing as tolerated    Complete by:  As directed      Allergies as of 08/10/2016      Reactions   Other Other (See Comments)  IV dye, heart stopped   Amlodipine Nausea And Vomiting   Dizziness      Medication List    STOP taking these medications   aspirin EC 81 MG tablet     TAKE these medications   bisoprolol-hydrochlorothiazide 5-6.25 MG tablet Commonly known as:  ZIAC Take 1 tablet by mouth daily.   methocarbamol 500 MG tablet Commonly known as:  ROBAXIN Take 1 tablet (500 mg total) by mouth every 6 (six) hours as needed for muscle spasms.   METRONIDAZOLE (TOPICAL) 0.75 % Lotn Apply 1 application topically as needed (rosacea).   oxyCODONE 5 MG immediate release tablet Commonly known as:  Oxy IR/ROXICODONE Take 1-2 tablets (5-10 mg total) by mouth every 4 (four) hours as needed for moderate pain or severe pain.   rivaroxaban 10 MG Tabs tablet Commonly known as:  XARELTO Take 1 tablet (10 mg total) by mouth daily with breakfast. Xarelto 10 mg daily for ten days, then change to Aspirin 325 mg daily for two weeks, then resume the Baby Aspirin 81 mg daily at home   traMADol 50 MG tablet Commonly known as:  ULTRAM Take 1-2 tablets (50-100 mg total) by mouth every 6 (six) hours as needed for moderate pain.      Follow-up Information    Gearlean Alf, MD. Schedule an appointment as soon as possible for a visit on 08/22/2016.   Specialty:  Orthopedic Surgery Contact  information: [address] [place] [address] [phone]           Signed: Arlee Muslim, PA-C Orthopaedic Surgery 08/10/2016, 7:22 AM

## 2016-08-13 DIAGNOSIS — M5116 Intervertebral disc disorders with radiculopathy, lumbar region: Secondary | ICD-10-CM | POA: Diagnosis not present

## 2016-08-13 DIAGNOSIS — Z7901 Long term (current) use of anticoagulants: Secondary | ICD-10-CM | POA: Diagnosis not present

## 2016-08-13 DIAGNOSIS — Z96653 Presence of artificial knee joint, bilateral: Secondary | ICD-10-CM | POA: Diagnosis not present

## 2016-08-13 DIAGNOSIS — I10 Essential (primary) hypertension: Secondary | ICD-10-CM | POA: Diagnosis not present

## 2016-08-13 DIAGNOSIS — Z471 Aftercare following joint replacement surgery: Secondary | ICD-10-CM | POA: Diagnosis not present

## 2016-08-13 DIAGNOSIS — Z79891 Long term (current) use of opiate analgesic: Secondary | ICD-10-CM | POA: Diagnosis not present

## 2016-08-13 DIAGNOSIS — M4727 Other spondylosis with radiculopathy, lumbosacral region: Secondary | ICD-10-CM | POA: Diagnosis not present

## 2016-08-13 DIAGNOSIS — Z8673 Personal history of transient ischemic attack (TIA), and cerebral infarction without residual deficits: Secondary | ICD-10-CM | POA: Diagnosis not present

## 2016-08-16 DIAGNOSIS — Z471 Aftercare following joint replacement surgery: Secondary | ICD-10-CM | POA: Diagnosis not present

## 2016-08-16 DIAGNOSIS — Z96653 Presence of artificial knee joint, bilateral: Secondary | ICD-10-CM | POA: Diagnosis not present

## 2016-08-16 DIAGNOSIS — I10 Essential (primary) hypertension: Secondary | ICD-10-CM | POA: Diagnosis not present

## 2016-08-16 DIAGNOSIS — Z8673 Personal history of transient ischemic attack (TIA), and cerebral infarction without residual deficits: Secondary | ICD-10-CM | POA: Diagnosis not present

## 2016-08-16 DIAGNOSIS — Z79891 Long term (current) use of opiate analgesic: Secondary | ICD-10-CM | POA: Diagnosis not present

## 2016-08-16 DIAGNOSIS — M4727 Other spondylosis with radiculopathy, lumbosacral region: Secondary | ICD-10-CM | POA: Diagnosis not present

## 2016-08-16 DIAGNOSIS — Z7901 Long term (current) use of anticoagulants: Secondary | ICD-10-CM | POA: Diagnosis not present

## 2016-08-16 DIAGNOSIS — M5116 Intervertebral disc disorders with radiculopathy, lumbar region: Secondary | ICD-10-CM | POA: Diagnosis not present

## 2016-08-17 DIAGNOSIS — Z96653 Presence of artificial knee joint, bilateral: Secondary | ICD-10-CM | POA: Diagnosis not present

## 2016-08-17 DIAGNOSIS — M5116 Intervertebral disc disorders with radiculopathy, lumbar region: Secondary | ICD-10-CM | POA: Diagnosis not present

## 2016-08-17 DIAGNOSIS — I10 Essential (primary) hypertension: Secondary | ICD-10-CM | POA: Diagnosis not present

## 2016-08-17 DIAGNOSIS — Z471 Aftercare following joint replacement surgery: Secondary | ICD-10-CM | POA: Diagnosis not present

## 2016-08-17 DIAGNOSIS — M4727 Other spondylosis with radiculopathy, lumbosacral region: Secondary | ICD-10-CM | POA: Diagnosis not present

## 2016-08-17 DIAGNOSIS — Z79891 Long term (current) use of opiate analgesic: Secondary | ICD-10-CM | POA: Diagnosis not present

## 2016-08-17 DIAGNOSIS — Z7901 Long term (current) use of anticoagulants: Secondary | ICD-10-CM | POA: Diagnosis not present

## 2016-08-17 DIAGNOSIS — Z8673 Personal history of transient ischemic attack (TIA), and cerebral infarction without residual deficits: Secondary | ICD-10-CM | POA: Diagnosis not present

## 2016-08-19 DIAGNOSIS — M4727 Other spondylosis with radiculopathy, lumbosacral region: Secondary | ICD-10-CM | POA: Diagnosis not present

## 2016-08-19 DIAGNOSIS — I10 Essential (primary) hypertension: Secondary | ICD-10-CM | POA: Diagnosis not present

## 2016-08-19 DIAGNOSIS — Z8673 Personal history of transient ischemic attack (TIA), and cerebral infarction without residual deficits: Secondary | ICD-10-CM | POA: Diagnosis not present

## 2016-08-19 DIAGNOSIS — M5116 Intervertebral disc disorders with radiculopathy, lumbar region: Secondary | ICD-10-CM | POA: Diagnosis not present

## 2016-08-19 DIAGNOSIS — Z471 Aftercare following joint replacement surgery: Secondary | ICD-10-CM | POA: Diagnosis not present

## 2016-08-19 DIAGNOSIS — Z79891 Long term (current) use of opiate analgesic: Secondary | ICD-10-CM | POA: Diagnosis not present

## 2016-08-19 DIAGNOSIS — Z96653 Presence of artificial knee joint, bilateral: Secondary | ICD-10-CM | POA: Diagnosis not present

## 2016-08-19 DIAGNOSIS — Z7901 Long term (current) use of anticoagulants: Secondary | ICD-10-CM | POA: Diagnosis not present

## 2016-08-20 DIAGNOSIS — Z7901 Long term (current) use of anticoagulants: Secondary | ICD-10-CM | POA: Diagnosis not present

## 2016-08-20 DIAGNOSIS — Z79891 Long term (current) use of opiate analgesic: Secondary | ICD-10-CM | POA: Diagnosis not present

## 2016-08-20 DIAGNOSIS — I10 Essential (primary) hypertension: Secondary | ICD-10-CM | POA: Diagnosis not present

## 2016-08-20 DIAGNOSIS — Z8673 Personal history of transient ischemic attack (TIA), and cerebral infarction without residual deficits: Secondary | ICD-10-CM | POA: Diagnosis not present

## 2016-08-20 DIAGNOSIS — Z471 Aftercare following joint replacement surgery: Secondary | ICD-10-CM | POA: Diagnosis not present

## 2016-08-20 DIAGNOSIS — M4727 Other spondylosis with radiculopathy, lumbosacral region: Secondary | ICD-10-CM | POA: Diagnosis not present

## 2016-08-20 DIAGNOSIS — M5116 Intervertebral disc disorders with radiculopathy, lumbar region: Secondary | ICD-10-CM | POA: Diagnosis not present

## 2016-08-20 DIAGNOSIS — Z96653 Presence of artificial knee joint, bilateral: Secondary | ICD-10-CM | POA: Diagnosis not present

## 2016-08-21 ENCOUNTER — Ambulatory Visit: Payer: Medicare Other

## 2016-08-22 DIAGNOSIS — M1711 Unilateral primary osteoarthritis, right knee: Secondary | ICD-10-CM | POA: Diagnosis not present

## 2016-08-25 DIAGNOSIS — M1711 Unilateral primary osteoarthritis, right knee: Secondary | ICD-10-CM | POA: Diagnosis not present

## 2016-08-28 ENCOUNTER — Ambulatory Visit: Payer: Medicare Other

## 2016-09-07 ENCOUNTER — Ambulatory Visit
Admission: RE | Admit: 2016-09-07 | Discharge: 2016-09-07 | Disposition: A | Discharge status: Home / Self Care | Payer: Medicare Other | Source: Ambulatory Visit | Attending: Physician Assistant | Admitting: Physician Assistant

## 2016-09-07 ENCOUNTER — Other Ambulatory Visit: Payer: Self-pay | Admitting: Family Medicine

## 2016-09-07 DIAGNOSIS — Z1231 Encounter for screening mammogram for malignant neoplasm of breast: Secondary | ICD-10-CM

## 2016-09-08 ENCOUNTER — Other Ambulatory Visit: Payer: Self-pay | Admitting: Family Medicine

## 2016-09-08 DIAGNOSIS — R928 Other abnormal and inconclusive findings on diagnostic imaging of breast: Secondary | ICD-10-CM

## 2016-09-12 ENCOUNTER — Ambulatory Visit
Admission: RE | Admit: 2016-09-12 | Discharge: 2016-09-12 | Disposition: A | Discharge status: Home / Self Care | Payer: Medicare Other | Source: Ambulatory Visit | Attending: Family Medicine | Admitting: Family Medicine

## 2016-09-12 DIAGNOSIS — R928 Other abnormal and inconclusive findings on diagnostic imaging of breast: Secondary | ICD-10-CM

## 2016-09-12 DIAGNOSIS — N6001 Solitary cyst of right breast: Secondary | ICD-10-CM | POA: Diagnosis not present

## 2016-09-14 DIAGNOSIS — M1711 Unilateral primary osteoarthritis, right knee: Secondary | ICD-10-CM | POA: Diagnosis not present

## 2016-10-16 NOTE — Anesthesia Postprocedure Evaluation (Signed)
Anesthesia Post Note  Patient: Kimberly Ferrell  Procedure(s) Performed: Procedure(s) (LRB): RIGHT UNICOMPARTMENTAL KNEE (Right)     Anesthesia Post Evaluation  Last Vitals:  Vitals:   08/10/16 0606 08/10/16 0927  BP: (!) 142/51 (!) 155/81  Pulse: 63 64  Resp: 16 16  Temp: 36.8 C 36.7 C    Last Pain:  Vitals:   08/10/16 1139  TempSrc:   PainSc: 2                  Dajanae Brophy S

## 2016-10-16 NOTE — Addendum Note (Signed)
Addendum  created 10/16/16 1245 by Myrtie Soman, MD   Sign clinical note

## 2016-11-01 ENCOUNTER — Ambulatory Visit: Payer: Medicare Other | Admitting: Adult Health

## 2016-11-02 ENCOUNTER — Ambulatory Visit (INDEPENDENT_AMBULATORY_CARE_PROVIDER_SITE_OTHER): Payer: Medicare Other | Admitting: Family Medicine

## 2016-11-02 VITALS — BP 161/83 | HR 72 | Ht 65.0 in | Wt 244.9 lb

## 2016-11-02 DIAGNOSIS — G458 Other transient cerebral ischemic attacks and related syndromes: Secondary | ICD-10-CM

## 2016-11-02 DIAGNOSIS — I10 Essential (primary) hypertension: Secondary | ICD-10-CM | POA: Diagnosis not present

## 2016-11-02 DIAGNOSIS — K219 Gastro-esophageal reflux disease without esophagitis: Secondary | ICD-10-CM

## 2016-11-02 MED ORDER — BISOPROLOL-HYDROCHLOROTHIAZIDE 5-6.25 MG PO TABS: 1.0000 | ORAL_TABLET | Freq: Every day | ORAL | 1 refills | Status: DC

## 2016-11-02 NOTE — Assessment & Plan Note (Signed)
Needing nexium taqbs OTC about once every 2-3 wks.  Eating spaghetti or pizza.

## 2016-11-02 NOTE — Progress Notes (Signed)
Impression and Recommendations:    1. Other specified transient cerebral ischemias   2. Gastroesophageal reflux disease, esophagitis presence not specified   3. Essential hypertension   4. Obesity, Class III, BMI 40-49.9 (morbid obesity) (HCC)     GERD (gastroesophageal reflux disease) Needing nexium taqbs OTC about once every 2-3 wks.  Eating spaghetti or pizza.     Education and routine counseling performed. Handouts provided.   This SmartLink is deprecated. Use AVSMEDLIST instead to display the medication list for a patient.     Orders Placed This Encounter  Procedures  . CBC with Differential/Platelet  . Comprehensive metabolic panel  . Hemoglobin A1c  . Lipid panel  . TSH  . VITAMIN D 25 Hydroxy (Vit-D Deficiency, Fractures)     Return in about 4 months (around 03/04/2017) for Hypertension, wt mgt, OA  follow up every 4 mo.  The patient was counseled, risk factors were discussed, anticipatory guidance given.  Gross side effects, risk and benefits, and alternatives of medications discussed with patient.  Patient is aware that all medications have potential side effects and we are unable to predict every side effect or drug-drug interaction that may occur.  Expresses verbal understanding and consents to current therapy plan and treatment regimen.  Please see AVS handed out to patient at the end of our visit for further patient instructions/ counseling done pertaining to today's office visit.    Note: This document was prepared using Dragon voice recognition software and may include unintentional dictation errors.     Subjective:    Chief Complaint  Patient presents with  . Hypertension    HPI: Kimberly Ferrell is a 76 y.o. female who presents to [place] [facility] today for follow up for HTN.     Problem  Gerd (Gastroesophageal Reflux Disease)   Last office visit we started her on omeprazole for her choking sensation she gets  when she is eating meat.  She states that in her family and everybody starts to choke on thicker meats and all. Feels like it's getting stuck in there.   Omeprazole started last OV- no difference in sx at all.       HTN:  -  Her blood pressure has been controlled at home 147/61 on average.    - Patient reports good compliance with blood pressure medications  - Denies medication S-E   - Smoking Status noted   - She denies new onset of: chest pain, exercise intolerance, shortness of breath, dizziness, visual changes, headache, lower extremity swelling or claudication.   Today their BP is BP: (!) 161/83   Last 3 blood pressure readings in our office are as follows: BP Readings from Last 3 Encounters:  04/09/17 (!) 141/63  03/06/17 (!) 160/89  11/02/16 (!) 161/83    Pulse Readings from Last 3 Encounters:  04/09/17 64  03/06/17 61  11/02/16 72    Filed Weights   11/02/16 1448  Weight: 244 lb 14.4 oz (111.1 kg)      Patient Care Team    Relationship Specialty Notifications Start End  Mellody Dance, DO PCP - General Family Medicine  01/31/16   Gaynelle Arabian, MD Consulting Physician Orthopedic Surgery  01/31/16   Druscilla Brownie, MD Consulting Physician Dermatology  01/31/16   Anastasio Auerbach, MD Consulting Physician Gynecology  02/06/16   Pieter Partridge, DO Consulting Physician Neurology  11/02/16      Lab Results  Component Value Date  CREATININE 0.70 04/09/2017   BUN 9 04/09/2017   NA 144 04/09/2017   K 4.1 04/09/2017   CL 106 04/09/2017   CO2 24 04/09/2017    Lab Results  Component Value Date   CHOL 122 11/10/2016   CHOL 136 04/16/2013    Lab Results  Component Value Date   HDL 40 11/10/2016   HDL 33.30 (L) 04/16/2013    Lab Results  Component Value Date   LDLCALC 62 11/10/2016   LDLCALC 80 04/16/2013    Lab Results  Component Value Date   TRIG 98 11/10/2016   TRIG 116.0 04/16/2013    Lab Results  Component Value Date   CHOLHDL  3.1 11/10/2016   CHOLHDL 4 04/16/2013    No results found for: LDLDIRECT ===================================================================   Patient Active Problem List   Diagnosis Date Noted  . BMI 40.0-44.9, adult ([place]) 11/17/2016    Priority: High  . HTN (hypertension) 06/15/2011    Priority: High  . Glucose intolerance (impaired glucose tolerance) : A1c elevated 11/17/2016    Priority: Medium  . TIA (transient ischemic attack) 01/31/2016    Priority: Medium  . Vitamin D deficiency 11/17/2016    Priority: Low  . GERD (gastroesophageal reflux disease) 11/02/2016    Priority: Low  . OA (osteoarthritis) of knee 05/23/2013    Priority: Low  . Myalgia and myositis 01/30/2012    Priority: Low  . Obesity, Class III, BMI 40-49.9 (morbid obesity) ([place]) 03/06/2017  . Pre-operative examination 07/31/2016  . Vaginal mass 07/23/2014  . OAB (overactive bladder) 04/16/2013  . Acute bronchitis 04/16/2013  . Post-nasal drip 05/30/2012  . Finger injury 05/30/2012  . Lung nodule seen on imaging study 01/23/2012  . Flank pain 01/22/2012  . Radicular low back pain 01/22/2012  . Rosacea 06/25/2011  . Back pain 06/15/2011  . Urinary frequency 06/15/2011     Past Medical History:  Diagnosis Date  . Arthritis   . Cancer ([place])    skin cancer removed from right forehead  . Chicken pox as child  . Colon polyps   . Complication of anesthesia    "hard to wake up"  . GERD (gastroesophageal reflux disease)    occasional  . Hemorrhoids    "sometimes"  . Hypertension   . Lung nodule seen on imaging study    noted 8/13- needs CT f/u in 1 year  . Mononucleosis   . Pneumonia    hx of  . PONV (postoperative nausea and vomiting)   . Rosacea   . TIA (transient ischemic attack) 2007   from IV dye, no residual, passed out and was told by RN that it was TIA     Past Surgical History:  Procedure Laterality Date  . ABDOMINAL HYSTERECTOMY  1993  . BREAST BIOPSY Left 2004   [place]   .  CHOLECYSTECTOMY  1992  . HEMORRHOID SURGERY  1960's  . PARTIAL KNEE ARTHROPLASTY Right 08/09/2016   Procedure: RIGHT UNICOMPARTMENTAL KNEE;  Surgeon: Gaynelle Arabian, MD;  Location: [facility];  Service: Orthopedics;  Laterality: Right;  requests 1hr; Adductor Block  . TOTAL KNEE ARTHROPLASTY Left 05/23/2013   Procedure: LEFT TOTAL KNEE ARTHROPLASTY;  Surgeon: Gearlean Alf, MD;  Location: [facility];  Service: Orthopedics;  Laterality: Left;     Family History  Problem Relation Age of Onset  . Cancer Mother 91       colon  . Sudden death Father 5       suicide  . Stroke  Maternal Grandmother 78  . Hypertension Neg Hx   . Hyperlipidemia Neg Hx   . Heart attack Neg Hx   . Diabetes Neg Hx   . Breast cancer Neg Hx      Social History   Substance and Sexual Activity  Drug Use No  ,  Social History   Substance and Sexual Activity  Alcohol Use No  ,  Social History   Tobacco Use  Smoking Status Never Smoker  Smokeless Tobacco Never Used  ,    Current Outpatient Medications on File Prior to Visit  Medication Sig Dispense Refill  . aspirin EC 81 MG tablet Take 81 mg by mouth daily.     No current facility-administered medications on file prior to visit.      Allergies  Allergen Reactions  . Other Other (See Comments)    IV dye, heart stopped  . Amlodipine Nausea And Vomiting    Dizziness     Review of Systems:   General:  Denies fever, chills Optho/Auditory:   Denies visual changes, blurred vision Respiratory:   Denies SOB, cough, wheeze, DIB  Cardiovascular:   Denies chest pain, palpitations, painful respirations Gastrointestinal:   Denies nausea, vomiting, diarrhea.  Endocrine:     Denies new hot or cold intolerance Musculoskeletal:  Denies joint swelling, gait issues, or new unexplained myalgias/ arthralgias Skin:  Denies rash, suspicious lesions  Neurological:    Denies dizziness, unexplained weakness, numbness  Psychiatric/Behavioral:   Denies mood  changes  Objective:    Blood pressure (!) 161/83, pulse 72, height 5\' 5"  (1.651 m), weight 244 lb 14.4 oz (111.1 kg).  Body mass index is 40.75 kg/m.  General: Well Developed, well nourished, and in no acute distress.  HEENT: Normocephalic, atraumatic, pupils equal round reactive to light, neck supple, No carotid bruits, no JVD Skin: Warm and dry, cap RF less 2 sec Cardiac: Regular rate and rhythm, S1, S2 WNL's, no murmurs rubs or gallops Respiratory: ECTA B/L, Not using accessory muscles, speaking in full sentences. NeuroM-Sk: Ambulates w/o assistance, moves ext * 4 w/o difficulty, sensation grossly intact.  Ext: scant edema b/l lower ext Psych: No HI/SI, judgement and insight good, Euthymic mood. Full Affect.

## 2016-11-02 NOTE — Patient Instructions (Addendum)
Please make an appointment when you leave today for the near future for lab only visit and come fasting.  Then you can follow-up with me in 4 months.  Instead of your Nexium, please try taking Zantac or Pepcid before meals whenever you know you're going to eat a trigger food like spaghetti or pizza, go ahead and take it preventatively.  please look into Weight Watchers and may be joining as weight loss even 5-10 pounds would have a tremendous benefit for your knees and your body  Please continue to monitor your blood pressure at home and even write it down a little book and bring in next office visit.      Behavior Modification Ideas for Weight Management  Weight management involves adopting a healthy lifestyle that includes a knowledge of nutrition and exercise, a positive attitude and the right kind of motivation. Internal motives such as better health, increased energy, self-esteem and personal control increase your chances of lifelong weight management success.  Remember to have realistic goals and think long-term success. Believe in yourself and you can do it. The following information will give you ideas to help you meet your goals.  Control Your Home Environment  Eat only while sitting down at the kitchen or dining room table. Do not eat while watching television, reading, cooking, talking on the phone, standing at the refrigerator or working on the computer. Keep tempting foods out of the house - don't buy them. Keep tempting foods out of sight. Have low-calorie foods ready to eat. Unless you are preparing a meal, stay out of the kitchen. Have healthy snacks at your disposal, such as small pieces of fruit, vegetables, canned fruit, pretzels, low-fat string cheese and nonfat cottage cheese.  Control Your Work Environment  Do not eat at [employer] or keep tempting snacks at your desk. If you get hungry between meals, plan healthy snacks and bring them with you to work. During your  breaks, go for a walk instead of eating. If you work around food, plan in advance the one item you will eat at mealtime. Make it inconvenient to nibble on food by chewing gum, sugarless candy or drinking water or another low-calorie beverage. Do not work through meals. Skipping meals slows down metabolism and may result in overeating at the next meal. If food is available for special occasions, either pick the healthiest item, nibble on low-fat snacks brought from home, don't have anything offered, choose one option and have a small amount, or have only a beverage.  Control Your Mealtime Environment  Serve your plate of food at the stove or kitchen counter. Do not put the serving dishes on the table. If you do put dishes on the table, remove them immediately when finished eating. Fill half of your plate with vegetables, a quarter with lean protein and a quarter with starch. Use smaller plates, bowls and glasses. A smaller portion will look large when it is in a little dish. Politely refuse second helpings. When fixing your plate, limit portions of food to one scoop/serving or less.   Daily Food Management  Replace eating with another activity that you will not associate with food. Wait 20 minutes before eating something you are craving. Drink a large glass of water or diet soda before eating. Always have a big glass or bottle of water to drink throughout the day. Avoid high-calorie add-ons such as cream with your coffee, butter, mayonnaise and salad dressings.  Shopping: Do not shop when hungry or tired.  Shop from a list and avoid buying anything that is not on your list. If you must have tempting foods, buy individual-sized packages and try to find a lower-calorie alternative. Don't taste test in the store. Read food labels. Compare products to help you make the healthiest choices.  Preparation: Chew a piece of gum while cooking meals. Use a quarter teaspoon if you taste test your  food. Try to only fix what you are going to eat, leaving yourself no chance for seconds. If you have prepared more food than you need, portion it into individual containers and freeze or refrigerate immediately. Don't snack while cooking meals.  Eating: Eat slowly. Remember it takes about 20 minutes for your stomach to send a message to your brain that it is full. Don't let fake hunger make you think you need more. The ideal way to eat is to take a bite, put your utensil down, take a sip of water, cut your next bite, take a bit, put your utensil down and so on. Do not cut your food all at one time. Cut only as needed. Take small bites and chew your food well. Stop eating for a minute or two at least once during a meal or snack. Take breaks to reflect and have conversation.  Cleanup and Leftovers: Label leftovers for a specific meal or snack. Freeze or refrigerate individual portions of leftovers. Do not clean up if you are still hungry.  Eating Out and Social Eating  Do not arrive hungry. Eat something light before the meal. Try to fill up on low-calorie foods, such as vegetables and fruit, and eat smaller portions of the high-calorie foods. Eat foods that you like, but choose small portions. If you want seconds, wait at least 20 minutes after you have eaten to see if you are actually hungry or if your eyes are bigger than your stomach. Limit alcoholic beverages. Try a soda water with a twist of lime. Do not skip other meals in the day to save room for the special event.  At Restaurants: Order  la carte rather than buffet style. Order some vegetables or a salad for an appetizer instead of eating bread. If you order a high-calorie dish, share it with someone. Try an after-dinner mint with your coffee. If you do have dessert, share it with two or more people. Don't overeat because you do not want to waste food. Ask for a doggie bag to take extra food home. Tell the server to put half  of your entree in a to go bag before the meal is served to you. Ask for salad dressing, gravy or high-fat sauces on the side. Dip the tip of your fork in the dressing before each bite. If bread is served, ask for only one piece. Try it plain without butter or oil. At [employer] where oil and vinegar is served with bread, use only a small amount of oil and a lot of vinegar for dipping.  At a Friend[facility] House: Offer to bring a dish, appetizer or dessert that is low in calories. Serve yourself small portions or tell the host that you only want a small amount. Stand or sit away from the snack table. Stay away from the kitchen or stay busy if you are near the food. Limit your alcohol intake.  At [employer] and Cafeterias: Cover most of your plate with lettuce and/or vegetables. Use a salad plate instead of a dinner plate. After eating, clear away your dishes before having coffee or  tea.  Entertaining at Home: Explore low-fat, low-cholesterol cookbooks. Use single-serving foods like chicken breasts or hamburger patties. Prepare low-calorie appetizers and desserts.   Holidays: Keep tempting foods out of sight. Decorate the house without using food. Have low-calorie beverages and foods on hand for guests. Allow yourself one planned treat a day. Don't skip meals to save up for the holiday feast. Eat regular, planned meals.   Exercise Well  Make exercise a priority and a planned activity in the day. If possible, walk the entire or part of the distance to work. Get an exercise buddy. Go for a walk with a colleague during one of your breaks, go to the gym, run or take a walk with a friend, walk in the mall with a shopping companion. Park at the end of the parking lot and walk to the store or office entrance. Always take the stairs all of the way or at least part of the way to your floor. If you have a desk job, walk around the office frequently. Do leg lifts while sitting at your  desk. Do something outside on the weekends like going for a hike or a bike ride.   Have a Healthy Attitude  Make health your weight management priority. Be realistic. Have a goal to achieve a healthier you, not necessarily the lowest weight or ideal weight based on calculations or tables. Focus on a healthy eating style, not on dieting. Dieting usually lasts for a short amount of time and rarely produces long-term success. Think long term. You are developing new healthy behaviors to follow next month, in a year and in a decade.    This information is for educational purposes only and is not intended to replace the advice of your doctor or health care provider. We encourage you to discuss with your doctor any questions or concerns you may have.        Guidelines for Losing Weight   We want weight loss that will last so you should lose 1-2 pounds a week.  THAT IS IT! Please pick THREE things a month to change. Once it is a habit check off the item. Then pick another three items off the list to become habits.  If you are already doing a habit on the list GREAT!  Cross that item off!  Don't drink your calories. Ie, alcohol, soda, fruit juice, and sweet tea.   Drink more water. Drink a glass when you feel hungry or before each meal.   Eat breakfast - Complex carb and protein (likeDannon light and fit yogurt, oatmeal, fruit, eggs, [place] bacon).  Measure your cereal.  Eat no more than one cup a day. (ie Kashi)  Eat an apple a day.  Add a vegetable a day.  Try a new vegetable a month.  Use Pam! Stop using oil or butter to [place].  Don't finish your plate or use smaller plates.  Share your dessert.  Eat sugar free Jello for dessert or frozen grapes.  Don't eat 2-3 hours before bed.  Switch to whole wheat bread, pasta, and brown rice.  Make healthier choices when you eat out. No fries!  Pick baked chicken, NOT fried.  Don't forget to SLOW DOWN when you eat. It is not going  anywhere.   Take the stairs.  Park far away in the parking lot  Lift soup cans (or weights) for 10 minutes while watching TV.  Walk at work for 10 minutes during break.  Walk outside 1 time a  week with your friend, kids, dog, or significant other.  Start a walking group at church.  Walk the mall as much as you can tolerate.   Keep a food diary.  Weigh yourself daily.  Walk for 15 minutes 3 days per week.  [place] at home more often and eat out less. If life happens and you go back to old habits, it is okay.  Just start over. You can do it!  If you experience chest pain, get short of breath, or tired during the exercise, please stop immediately and inform your doctor.    Before you even begin to attack a weight-loss plan, it pays to remember this: You are not fat. You have fat. Losing weight isn't about blame or shame; it's simply another achievement to accomplish. Dieting is like any other skill-you have to buckle down and work at it. As long as you act in a smart, reasonable way, you'll ultimately get where you want to be. Here are some weight loss pearls for you.   1. It's Not a Diet. It's a Lifestyle Thinking of a diet as something you're on and suffering through only for the short term doesn't work. To shed weight and keep it off, you need to make permanent changes to the way you eat. It's OK to indulge occasionally, of course, but if you cut calories temporarily and then revert to your old way of eating, you'll gain back the weight quicker than you can say yo-yo. Use it to lose it. Research shows that one of the best predictors of long-term weight loss is how many pounds you drop in the first month. For that reason, nutritionists often suggest being stricter for the first two weeks of your new eating strategy to build momentum. Cut out added sugar and alcohol and avoid unrefined carbs. After that, figure out how you can reincorporate them in a way that's healthy and maintainable.   2. There's a Right Way to Exercise Working out burns calories and fat and boosts your metabolism by building muscle. But those trying to lose weight are notorious for overestimating the number of calories they burn and underestimating the amount they take in. Unfortunately, your system is biologically programmed to hold on to extra pounds and that means when you start exercising, your body senses the deficit and ramps up its hunger signals. If you're not diligent, you'll eat everything you burn and then some. Use it, to lose it. Cardio gets all the exercise glory, but strength and interval training are the real heroes. They help you build lean muscle, which in turn increases your metabolism and calorie-burning ability 3. Don't Overreact to Mild Hunger Some people have a hard time losing weight because of hunger anxiety. To them, being hungry is bad-something to be avoided at all costs-so they carry snacks with them and eat when they don't need to. Others eat because they're stressed out or bored. While you never want to get to the point of being ravenous (that's when bingeing is likely to happen), a hunger pang, a craving, or the fact that it's 3:00 p.m. should not send you racing for the vending machine or obsessing about the energy bar in your purse. Ideally, you should put off eating until your stomach is growling and it's difficult to concentrate.  Use it to lose it. When you feel the urge to eat, use the HALT method. Ask yourself, Am I really hungry? Or am I angry or anxious, lonely or bored, or tired? If  you're still not certain, try the apple test. If you're truly hungry, an apple should seem delicious; if it doesn't, something else is going on. Or you can try drinking water and making yourself busy, if you are still hungry try a healthy snack.  4. Not All Calories Are Created Equal The mechanics of weight loss are pretty simple: Take in fewer calories than you use for energy. But the kind of food  you eat makes all the difference. Processed food that's high in saturated fat and refined starch or sugar can cause inflammation that disrupts the hormone signals that tell your brain you're full. The result: You eat a lot more.  Use it to lose it. Clean up your diet. Swap in whole, unprocessed foods, including vegetables, lean protein, and healthy fats that will fill you up and give you the biggest nutritional bang for your calorie buck. In a few weeks, as your brain starts receiving regular hunger and fullness signals once again, you'll notice that you feel less hungry overall and naturally start cutting back on the amount you eat.  5. Protein, Produce, and Plant-Based Fats Are Your Weight-Loss Trinity Here's why eating the three Ps regularly will help you drop pounds. Protein fills you up. You need it to build lean muscle, which keeps your metabolism humming so that you can torch more fat. People in a weight-loss program who ate double the recommended daily allowance for protein (about 110 grams for a 150-pound woman) lost 70 percent of their weight from fat, while people who ate the RDA lost only about 40 percent, one study found. Produce is packed with filling fiber. "It's very difficult to consume too many calories if you're eating a lot of vegetables. Example: Three cups of broccoli is a lot of food, yet only 93 calories. (Fruit is another story. It can be easy to overeat and can contain a lot of calories from sugar, so be sure to monitor your intake.) Plant-based fats like olive oil and those in avocados and nuts are healthy and extra satiating.  Use it to lose it. Aim to incorporate each of the three Ps into every meal and snack. People who eat protein throughout the day are able to keep weight off, according to a study in the [place] of Clinical Nutrition. In addition to meat, poultry and seafood, good sources are beans, lentils, eggs, tofu, and yogurt. As for fat, keep portion sizes in  check by measuring out salad dressing, oil, and nut butters (shoot for one to two tablespoons). Finally, eat veggies or a little fruit at every meal. People who did that consumed 308 fewer calories but didn't feel any hungrier than when they didn't eat more produce.  7. How You Eat Is As Important As What You Eat In order for your brain to register that you're full, you need to focus on what you're eating. Sit down whenever you eat, preferably at a table. Turn off the TV or computer, put down your phone, and look at your food. Smell it. Chew slowly, and don't put another bite on your fork until you swallow. When women ate lunch this attentively, they consumed 30 percent less when snacking later than those who listened to an audiobook at lunchtime, according to a study in the [place] of Nutrition. 8. Weighing Yourself Really Works The scale provides the best evidence about whether your efforts are paying off. Seeing the numbers tick up or down or stagnate is motivation to keep going-or to  rethink your approach. A 2015 study at [facility] found that daily weigh-ins helped people lose more weight, keep it off, and maintain that loss, even after two years. Use it to lose it. Step on the scale at the same time every day for the best results. If your weight shoots up several pounds from one weigh-in to the next, don't freak out. Eating a lot of salt the night before or having your period is the likely culprit. The number should return to normal in a day or two. It's a steady climb that you need to do something about. 9. Too Much Stress and Too Little Sleep Are Your Enemies When you're tired and frazzled, your body cranks up the production of cortisol, the stress hormone that can cause carb cravings. Not getting enough sleep also boosts your levels of ghrelin, a hormone associated with hunger, while suppressing leptin, a hormone that signals fullness and satiety. People on a diet who slept only five  and a half hours a night for two weeks lost 55 percent less fat and were hungrier than those who slept eight and a half hours, according to a study in the [place]. Use it to lose it. Prioritize sleep, aiming for seven hours or more a night, which research shows helps lower stress. And make sure you're getting quality zzz's. If a snoring spouse or a fidgety cat wakes you up frequently throughout the night, you may end up getting the equivalent of just four hours of sleep, according to a study from [facility]. Keep pets out of the bedroom, and use a white-noise app to drown out snoring. 10. You Will Hit a plateau-And You Can Bust Through It As you slim down, your body releases much less leptin, the fullness hormone.  If you're not strength training, start right now. Building muscle can raise your metabolism to help you overcome a plateau. To keep your body challenged and burning calories, incorporate new moves and more intense intervals into your workouts or add another sweat session to your weekly routine. Alternatively, cut an extra 100 calories or so a day from your diet. Now that you've lost weight, your body simply doesn't need as much fuel.    Since food equals calories, in order to lose weight you must either eat fewer calories, exercise more to burn off calories with activity, or both. Food that is not used to fuel the body is stored as fat. A major component of losing weight is to make smarter food choices. Here's how:  1)   Limit non-nutritious foods, such as: Sugar, honey, syrups and candy Pastries, donuts, pies, cakes and cookies Soft drinks, sweetened juices and alcoholic beverages  2)  Cut down on high-fat foods by: - Choosing poultry, fish or lean red meat - Choosing low-fat cooking methods, such as baking, broiling, steaming, grilling and boiling - Using low-fat or non-fat dairy products - Using vinaigrette, herbs, lemon or fat-free salad  dressings - Avoiding fatty meats, such as bacon, sausage, franks, ribs and luncheon meats - Avoiding high-fat snacks like nuts, chips and chocolate - Avoiding fried foods - Using less butter, margarine, oil and mayonnaise - Avoiding high-fat gravies, cream sauces and cream-based soups  3) Eat a variety of foods, including: - Fruit and vegetables that are raw, steamed or baked - Whole grains, breads, cereal, rice and pasta - Dairy products, such as low-fat or non-fat milk or yogurt, low-fat cottage cheese and low-fat cheese - Protein-rich foods like  chicken, [place], fish, lean meat and legumes, or beans  4) Change your eating habits by: - Eat three balanced meals a day to help control your hunger - Watch portion sizes and eat small servings of a variety of foods - Choose low-calorie snacks - Eat only when you are hungry and stop when you are satisfied - Eat slowly and try not to perform other tasks while eating - Find other activities to distract you from food, such as walking, taking up a hobby or being involved in the community - Include regular exercise in your daily routine ( minimum of 20 min of moderate-intensity exercise at least 5 days/week)  - Find a support group, if necessary, for emotional support in your weight loss journey           Easy ways to cut 100 calories   1. Eat your eggs with hot sauce OR salsa instead of cheese.  Eggs are great for breakfast, but many people consider eggs and cheese to be BFFs. Instead of cheese-1 oz. of cheddar has 114 calories-top your eggs with hot sauce, which contains no calories and helps with satiety and metabolism. Salsa is also a great option!!  2. Top your toast, waffles or pancakes with fresh berries instead of jelly or syrup. Half a cup of berries-fresh, frozen or thawed-has about 40 calories, compared with 2 tbsp. of maple syrup or jelly, which both have about 100 calories. The berries will also give you a good punch of fiber,  which helps keep you full and satisfied and won't spike blood sugar quickly like the jelly or syrup. 3. Swap the non-fat latte for black coffee with a splash of half-and-half. Contrary to its name, that non-fat latte has 130 calories and a startling 19g of carbohydrates per 16 oz. serving. Replacing that 'light' drinkable dessert with a black coffee with a splash of half-and-half saves you more than 100 calories per 16 oz. serving. 4. Sprinkle salads with freeze-dried raspberries instead of dried cranberries. If you want a sweet addition to your nutritious salad, stay away from dried cranberries. They have a whopping 130 calories per  cup and 30g carbohydrates. Instead, sprinkle freeze-dried raspberries guilt-free and save more than 100 calories per  cup serving, adding 3g of belly-filling fiber. 5. Go for mustard in place of mayo on your sandwich. Mustard can add really nice flavor to any sandwich, and there are tons of varieties, from spicy to honey. A serving of mayo is 95 calories, versus 10 calories in a serving of mustard.  Or try an avocado mayo spread: You can find the recipe few click this link: https://www.californiaavocado.com/recipes/recipe-container/[place]-avocado-mayo 6. Choose a DIY salad dressing instead of the store-bought kind. Mix Dijon or whole grain mustard with low-fat Kefir or red wine vinegar and garlic. 7. Use hummus as a spread instead of a dip. Use hummus as a spread on a high-fiber cracker or tortilla with a sandwich and save on calories without sacrificing taste. 8. Pick just one salad "accessory." Salad isn't automatically a calorie winner. It's easy to over-accessorize with toppings. Instead of topping your salad with nuts, avocado and cranberries (all three will clock in at 313 calories), just pick one. The next day, choose a different accessory, which will also keep your salad interesting. You don't wear all your jewelry every day, right? 9. Ditch the white pasta  in favor of spaghetti squash. One cup of cooked spaghetti squash has about 40 calories, compared with traditional spaghetti, which comes with more  than 200. Spaghetti squash is also nutrient-dense. It's a good source of fiber and Vitamins A and C, and it can be eaten just like you would eat pasta-with a great tomato sauce and [place] meatballs or with pesto, tofu and spinach, for example. 10. Dress up your chili, soups and stews with non-fat [place] yogurt instead of sour cream. Just a 'dollop' of sour cream can set you back 115 calories and a whopping 12g of fat-seven of which are of the artery-clogging variety. Added bonus: [place] yogurt is packed with muscle-building protein, calcium and B Vitamins. 11. Mash cauliflower instead of mashed potatoes. One cup of traditional mashed potatoes-in all their creamy goodness-has more than 200 calories, compared to mashed cauliflower, which you can typically eat for less than 100 calories per 1 cup serving. Cauliflower is a great source of the antioxidant indole-3-carbinol (I3C), which may help reduce the risk of some cancers, like breast cancer. 12. Ditch the ice cream sundae in favor of a [place] yogurt parfait. Instead of a cup of ice cream or fro-yo for dessert, try 1 cup of nonfat Greek yogurt topped with fresh berries and a sprinkle of cacao nibs. Both toppings are packed with antioxidants, which can help reduce cellular inflammation and oxidative damage. And the comparison is a no-brainer: One cup of ice cream has about 275 calories; one cup of frozen yogurt has about 230; and a cup of Greek yogurt has just 130, plus twice the protein, so you're less likely to return to the freezer for a second helping. 13. Put olive oil in a spray container instead of using it directly from the bottle. Each tablespoon of olive oil is 120 calories and 15g of fat. Use a mister instead of pouring it straight into the pan or onto a salad. This allows for portion control and will save  you more than 100 calories. 14. When baking, substitute canned pumpkin for butter or oil. Canned pumpkin-not pumpkin pie mix-is loaded with Vitamin A, which is important for skin and eye health, as well as immunity. And the comparisons are pretty crazy:  cup of canned pumpkin has about 40 calories, compared to butter or oil, which has more than 800 calories. Yes, 800 calories. Applesauce and mashed banana can also serve as good substitutions for butter or oil, usually in a 1:1 ratio. 15. Top casseroles with high-fiber cereal instead of breadcrumbs. Breadcrumbs are typically made with white bread, while breakfast cereals contain 5-9g of fiber per serving. Not only will you save more than 150 calories per  cup serving, the swap will also keep you more full and you'll get a metabolism boost from the added fiber. 16. Snack on pistachios instead of macadamia nuts. Believe it or not, you get the same amount of calories from 35 pistachios (100 calories) as you would from only five macadamia nuts. 17. Chow down on kale chips rather than potato chips. This is my favorite 'don't knock it 'till you try it' swap. Kale chips are so easy to make at home, and you can spice them up with a little grated parmesan or chili powder. Plus, they're a mere fraction of the calories of potato chips, but with the same crunch factor we crave so often. 18. Add seltzer and some fruit slices to your cocktail instead of soda or fruit juice. One cup of soda or fruit juice can pack on as much as 140 calories. Instead, use seltzer and fruit slices. The fruit provides valuable phytochemicals, such as flavonoids and  anthocyanins, which help to combat cancer and stave off the aging process.

## 2016-11-07 DIAGNOSIS — M7651 Patellar tendinitis, right knee: Secondary | ICD-10-CM | POA: Diagnosis not present

## 2016-11-10 ENCOUNTER — Other Ambulatory Visit (INDEPENDENT_AMBULATORY_CARE_PROVIDER_SITE_OTHER): Payer: Medicare Other

## 2016-11-10 DIAGNOSIS — G458 Other transient cerebral ischemic attacks and related syndromes: Secondary | ICD-10-CM | POA: Diagnosis not present

## 2016-11-10 DIAGNOSIS — K219 Gastro-esophageal reflux disease without esophagitis: Secondary | ICD-10-CM | POA: Diagnosis not present

## 2016-11-10 DIAGNOSIS — I10 Essential (primary) hypertension: Secondary | ICD-10-CM | POA: Diagnosis not present

## 2016-11-11 LAB — CBC WITH DIFFERENTIAL/PLATELET
Basophils Absolute: 0 10*3/uL (ref 0.0–0.2)
Basos: 0 %
EOS (ABSOLUTE): 0.2 10*3/uL (ref 0.0–0.4)
Eos: 4 %
HEMATOCRIT: 41.1 % (ref 34.0–46.6)
HEMOGLOBIN: 13.7 g/dL (ref 11.1–15.9)
IMMATURE GRANS (ABS): 0 10*3/uL (ref 0.0–0.1)
IMMATURE GRANULOCYTES: 0 %
Lymphocytes Absolute: 2.7 10*3/uL (ref 0.7–3.1)
Lymphs: 44 %
MCH: 30.9 pg (ref 26.6–33.0)
MCHC: 33.3 g/dL (ref 31.5–35.7)
MCV: 93 fL (ref 79–97)
MONOCYTES: 11 %
Monocytes Absolute: 0.6 10*3/uL (ref 0.1–0.9)
NEUTROS PCT: 41 %
Neutrophils Absolute: 2.5 10*3/uL (ref 1.4–7.0)
Platelets: 207 10*3/uL (ref 150–379)
RBC: 4.44 x10E6/uL (ref 3.77–5.28)
RDW: 13.7 % (ref 12.3–15.4)
WBC: 6 10*3/uL (ref 3.4–10.8)

## 2016-11-11 LAB — LIPID PANEL
CHOL/HDL RATIO: 3.1 ratio (ref 0.0–4.4)
CHOLESTEROL TOTAL: 122 mg/dL (ref 100–199)
HDL: 40 mg/dL (ref >39)
LDL Calculated: 62 mg/dL (ref 0–99)
TRIGLYCERIDES: 98 mg/dL (ref 0–149)
VLDL Cholesterol Cal: 20 mg/dL (ref 5–40)

## 2016-11-11 LAB — COMPREHENSIVE METABOLIC PANEL
ALBUMIN: 4 g/dL (ref 3.5–4.8)
ALT: 29 IU/L (ref 0–32)
AST: 34 IU/L (ref 0–40)
Albumin/Globulin Ratio: 1.4 (ref 1.2–2.2)
Alkaline Phosphatase: 82 IU/L (ref 39–117)
BUN / CREAT RATIO: 11 — AB (ref 12–28)
BUN: 8 mg/dL (ref 8–27)
Bilirubin Total: 0.8 mg/dL (ref 0.0–1.2)
CALCIUM: 9.1 mg/dL (ref 8.7–10.3)
CO2: 25 mmol/L (ref 20–29)
CREATININE: 0.71 mg/dL (ref 0.57–1.00)
Chloride: 104 mmol/L (ref 96–106)
GFR, EST AFRICAN AMERICAN: 96 mL/min/{1.73_m2} (ref >59)
GFR, EST NON AFRICAN AMERICAN: 84 mL/min/{1.73_m2} (ref >59)
GLOBULIN, TOTAL: 2.9 g/dL (ref 1.5–4.5)
Glucose: 108 mg/dL — ABNORMAL HIGH (ref 65–99)
Potassium: 3.8 mmol/L (ref 3.5–5.2)
SODIUM: 143 mmol/L (ref 134–144)
TOTAL PROTEIN: 6.9 g/dL (ref 6.0–8.5)

## 2016-11-11 LAB — VITAMIN D 25 HYDROXY (VIT D DEFICIENCY, FRACTURES): Vit D, 25-Hydroxy: 24.6 ng/mL — ABNORMAL LOW (ref 30.0–100.0)

## 2016-11-11 LAB — HEMOGLOBIN A1C
ESTIMATED AVERAGE GLUCOSE: 120 mg/dL
Hgb A1c MFr Bld: 5.8 % — ABNORMAL HIGH (ref 4.8–5.6)

## 2016-11-11 LAB — TSH: TSH: 2.14 u[IU]/mL (ref 0.450–4.500)

## 2016-11-17 ENCOUNTER — Encounter: Payer: Self-pay | Admitting: Family Medicine

## 2016-11-17 ENCOUNTER — Other Ambulatory Visit: Payer: Self-pay | Admitting: Family Medicine

## 2016-11-17 DIAGNOSIS — Z6841 Body Mass Index (BMI) 40.0 and over, adult: Secondary | ICD-10-CM | POA: Insufficient documentation

## 2016-11-17 DIAGNOSIS — E559 Vitamin D deficiency, unspecified: Secondary | ICD-10-CM

## 2016-11-17 DIAGNOSIS — R7302 Impaired glucose tolerance (oral): Secondary | ICD-10-CM | POA: Insufficient documentation

## 2016-11-17 MED ORDER — VITAMIN D (ERGOCALCIFEROL) 1.25 MG (50000 UNIT) PO CAPS: 50000.0000 [IU] | ORAL_CAPSULE | ORAL | 3 refills | Status: DC

## 2016-11-17 MED ORDER — VITAMIN D3 125 MCG (5000 UT) PO TABS: ORAL_TABLET | ORAL | 3 refills | Status: DC

## 2016-11-21 ENCOUNTER — Telehealth: Payer: Self-pay | Admitting: Family Medicine

## 2016-11-21 NOTE — Telephone Encounter (Signed)
Called patient and advised to get the OTC 5,000 units for daily use per [facility] note.  MPulliam, CMA/RT(R)

## 2016-11-21 NOTE — Telephone Encounter (Signed)
Pt called from pharmacy request MA/ nurse to call her back to advise which OTC brand & dosage of Vitamin D she should get getting-- Pt already purchased the Prescribed Vit D doctor ordered but says she was told to get an OTC as well-- Pls call [phone]. --glh

## 2016-12-07 DIAGNOSIS — M1711 Unilateral primary osteoarthritis, right knee: Secondary | ICD-10-CM | POA: Diagnosis not present

## 2016-12-07 DIAGNOSIS — M7651 Patellar tendinitis, right knee: Secondary | ICD-10-CM | POA: Diagnosis not present

## 2016-12-28 DIAGNOSIS — H25813 Combined forms of age-related cataract, bilateral: Secondary | ICD-10-CM | POA: Diagnosis not present

## 2016-12-28 DIAGNOSIS — D3131 Benign neoplasm of right choroid: Secondary | ICD-10-CM | POA: Diagnosis not present

## 2017-02-09 DIAGNOSIS — M7051 Other bursitis of knee, right knee: Secondary | ICD-10-CM | POA: Diagnosis not present

## 2017-02-09 DIAGNOSIS — M7651 Patellar tendinitis, right knee: Secondary | ICD-10-CM | POA: Diagnosis not present

## 2017-03-05 ENCOUNTER — Ambulatory Visit: Payer: Medicare Other | Admitting: Family Medicine

## 2017-03-06 ENCOUNTER — Ambulatory Visit (INDEPENDENT_AMBULATORY_CARE_PROVIDER_SITE_OTHER): Payer: Medicare Other | Admitting: Family Medicine

## 2017-03-06 ENCOUNTER — Telehealth: Payer: Self-pay | Admitting: Family Medicine

## 2017-03-06 ENCOUNTER — Encounter: Payer: Self-pay | Admitting: Family Medicine

## 2017-03-06 VITALS — BP 160/89 | HR 61 | Ht 65.0 in | Wt 245.9 lb

## 2017-03-06 DIAGNOSIS — Z23 Encounter for immunization: Secondary | ICD-10-CM

## 2017-03-06 DIAGNOSIS — M25562 Pain in left knee: Secondary | ICD-10-CM | POA: Diagnosis not present

## 2017-03-06 DIAGNOSIS — K219 Gastro-esophageal reflux disease without esophagitis: Secondary | ICD-10-CM

## 2017-03-06 DIAGNOSIS — G8929 Other chronic pain: Secondary | ICD-10-CM

## 2017-03-06 DIAGNOSIS — M545 Low back pain, unspecified: Secondary | ICD-10-CM

## 2017-03-06 DIAGNOSIS — M25561 Pain in right knee: Secondary | ICD-10-CM

## 2017-03-06 DIAGNOSIS — I10 Essential (primary) hypertension: Secondary | ICD-10-CM | POA: Diagnosis not present

## 2017-03-06 MED ORDER — BISOPROLOL-HYDROCHLOROTHIAZIDE 5-6.25 MG PO TABS: 1.0000 | ORAL_TABLET | Freq: Two times a day (BID) | ORAL | 1 refills | Status: DC

## 2017-03-06 MED ORDER — OMEPRAZOLE 20 MG PO CPDR: 20.0000 mg | DELAYED_RELEASE_CAPSULE | Freq: Every day | ORAL | 1 refills | Status: DC

## 2017-03-06 NOTE — Progress Notes (Signed)
Impression and Recommendations:    1. Essential hypertension   2. Gastroesophageal reflux disease, esophagitis presence not specified   3. Obesity, Class III, BMI 40-49.9 (morbid obesity) ([place])   4. Chronic low back pain without sciatica, unspecified back pain laterality   5. Chronic pain of both knees   6. Flu vaccine need   7. Need for zoster vaccination    - Inc her bisoprolol-HCTZ 5-6.25mg  med from QD to BID after much education.  Asked to check at home- keep log and bring in next OV.  If dev any sx, she will let us know. Extensive d/c pt re: lifestyle changes for HTN as well  - script for omeprazole after educated; gerd lifestyle changes d/c pt in detail   - encouraged wt loss, declines nutritional referal, encouraged to join [employer]  - will follow w/ Dr Maureen Ralphs re: back and knee pain.  Pain mgt per them  The patient was counseled, risk factors were discussed, anticipatory guidance given.   Meds ordered this encounter  Medications  . omeprazole (PRILOSEC) 20 MG capsule    Sig: Take 1 capsule (20 mg total) by mouth daily.    Dispense:  90 capsule    Refill:  1  . bisoprolol-hydrochlorothiazide (ZIAC) 5-6.25 MG tablet    Sig: Take 1 tablet by mouth 2 (two) times daily.    Dispense:  180 tablet    Refill:  1  . Zoster Vaccine Adjuvanted [facility]) injection    Sig: Inject 0.5 mLs into the muscle once.    Dispense:  0.5 mL    Refill:  0    Gross side effects, risk and benefits, and alternatives of medications and treatment plan in general discussed with patient.  Patient is aware that all medications have potential side effects and we are unable to predict every side effect or drug-drug interaction that may occur.   Patient will call with any questions prior to using medication if they have concerns.  Expresses verbal understanding and consents to current therapy and treatment regimen.  No barriers to understanding were identified.  Red flag symptoms and signs discussed in  detail.  Patient expressed understanding regarding what to do in case of emergency\urgent symptoms  Please see AVS handed out to patient at the end of our visit for further patient instructions/ counseling done pertaining to today's office visit.   Return for 3-4 wks- bring in BP log, reck after inc in BP med- A1c, CMP, TSh, Vit D.     Note: This document was prepared using Dragon voice recognition software and may include unintentional dictation errors.  Mellody Dance 12:14 PM --------------------------------------------------------------------------------------------------------------------------------------------------------------------------------------------------------------------------------------------    Subjective:    CC:  Chief Complaint  Patient presents with  . Follow-up    HPI: Kimberly Ferrell is a 76 y.o. female who presents to [place] at [facility] today for issues as discussed below.  Reflux type sx:  Has been having belching and heartburn more than usual lately- spegheti and spicey foods.   Taking OTC nexium daily on her own accord for only about 4-5 days. But long family h/o feeling like choking on some meats and belching burping a lot- everyone has reflux  Has complaints of chronic back and knee pain today.  complete knee replacmnt on L - partial knee replacement ion R--Seeing Dr. Elmyra Ricks.  Was told it can cause some lower back pain.  Ortho Doc did x-rays of her back and evaluated her- told her  likely coming from her altered gait.  Under his txmnt.  Pt frustrated by it and has planned f/up appt with them   HTN : -  Her blood pressure has ( not) been controlled at home. Not checking - Patient reports good compliance with blood pressure medications - Denies medication S-E - Smoking Status noted  - She denies new onset of: chest pain, exercise intolerance, shortness of breath, dizziness, visual changes, headache, lower extremity swelling or  claudication.   Today their BP is BP: (!) 160/89  Last 3 blood pressure readings in our office are as follows: BP Readings from Last 3 Encounters:     03/06/17 (!) 160/89  11/02/16 (!) 161/83     Filed Weights   03/06/17 1044  Weight: 245 lb 14.4 oz (111.5 kg)     Wt Readings from Last 3 Encounters:  04/09/17 250 lb (113.4 kg)  03/06/17 245 lb 14.4 oz (111.5 kg)  11/02/16 244 lb 14.4 oz (111.1 kg)   BP Readings from Last 3 Encounters:  04/09/17 (!) 141/63  03/06/17 (!) 160/89  11/02/16 (!) 161/83   Pulse Readings from Last 3 Encounters:  04/09/17 64  03/06/17 61  11/02/16 72   BMI Readings from Last 3 Encounters:  04/09/17 41.60 kg/m  03/06/17 40.92 kg/m  11/02/16 40.75 kg/m     Patient Care Team    Relationship Specialty Notifications Start End  Mellody Dance, DO PCP - General Family Medicine  01/31/16   Gaynelle Arabian, MD Consulting Physician Orthopedic Surgery  01/31/16   Druscilla Brownie, MD Consulting Physician Dermatology  01/31/16   Anastasio Auerbach, MD Consulting Physician Gynecology  02/06/16   Pieter Partridge, [place] Physician Neurology  11/02/16      Patient Active Problem List   Diagnosis Date Noted  . BMI 40.0-44.9, adult ([place]) 11/17/2016    Priority: High  . HTN (hypertension) 06/15/2011    Priority: High  . Glucose intolerance (impaired glucose tolerance) : A1c elevated 11/17/2016    Priority: Medium  . TIA (transient ischemic attack) 01/31/2016    Priority: Medium  . Vitamin D deficiency 11/17/2016    Priority: Low  . GERD (gastroesophageal reflux disease) 11/02/2016    Priority: Low  . OA (osteoarthritis) of knee 05/23/2013    Priority: Low  . Myalgia and myositis 01/30/2012    Priority: Low  . Obesity, Class III, BMI 40-49.9 (morbid obesity) ([place]) 03/06/2017  . Pre-operative examination 07/31/2016  . Vaginal mass 07/23/2014  . OAB (overactive bladder) 04/16/2013  . Acute bronchitis 04/16/2013  . Post-nasal drip  05/30/2012  . Finger injury 05/30/2012  . Lung nodule seen on imaging study 01/23/2012  . Flank pain 01/22/2012  . Radicular low back pain 01/22/2012  . Rosacea 06/25/2011  . Back pain 06/15/2011  . Urinary frequency 06/15/2011    Past Medical history, Surgical history, Family history, Social history, Allergies and Medications have been entered into the medical record, reviewed and changed as needed.    Allergies:  Allergies  Allergen Reactions  . Other Other (See Comments)    IV dye, heart stopped  . Amlodipine Nausea And Vomiting    Dizziness     Review of Systems: General:   Denies fever, chills, unexplained weight loss.  Optho/Auditory:   Denies visual changes, blurred vision/LOV Respiratory:   Denies wheeze, DOE more than baseline levels.  Cardiovascular:   Denies chest pain, palpitations, new onset peripheral edema  Gastrointestinal:   Denies nausea, vomiting, diarrhea, abd pain.  Genitourinary: Denies dysuria, freq/ urgency, flank pain or discharge from genitals.  Endocrine:     Denies hot or cold intolerance, polyuria, polydipsia. Musculoskeletal:   Denies unexplained myalgias, joint swelling, unexplained arthralgias, gait problems.  Skin:  Denies new onset rash, suspicious lesions Neurological:     Denies dizziness, unexplained weakness, numbness  Psychiatric/Behavioral:   Denies mood changes, suicidal or homicidal ideations, hallucinations    Objective:   Blood pressure (!) 160/89, pulse 61, height 5\' 5"  (1.651 m), weight 245 lb 14.4 oz (111.5 kg). Body mass index is 40.92 kg/m. General:  Well Developed, well nourished, appropriate for stated age.  Neuro:  Alert and oriented,  extra-ocular muscles intact  HEENT:  Normocephalic, atraumatic, neck supple, no carotid bruits appreciated  Skin:  no gross rash, warm, pink. Cardiac:  RRR, S1 S2 Respiratory:  ECTA B/L and A/P, Not using accessory muscles, speaking in full sentences- unlabored. Vascular:  Ext warm, no  cyanosis apprec.; cap RF less 2 sec. Psych:  No HI/SI, judgement and insight good, Euthymic mood. Full Affect.

## 2017-03-06 NOTE — Patient Instructions (Addendum)
I hand wrote message on their AVS is that both she and her husband will need Medicare wellness exams in the near future.  - Melissa please update both Kimberly Ferrell and Kimberly Ferrell today with their flu and Shingrix vaccines.  No eating or drinking within 3 hours of lying down; avoid trigger foods such as citrus, tomato-based foods, spicy foods, fried foods etc.  Please avoid caffeine as well which can lower the esophageal sphincter tone and make foods, up.  Take your omeprazole daily and if need be you can take a Maalox or Tums on top of that.  You're going to double up on your blood pressure medicine and take the Ziac twice daily instead of once daily.  Please monitor your blood pressure and write it down and bring it in in 3-4 weeks for follow-up.  I want to hear from you sooner if you have any problems or concerns.   - Next office visit we will check A1c,TSH, vitamin D, and CMP after the change in blood pressure medicines   Weight Watchers;  go to silver sneakers at the [facility]- move more.      Food Choices for Gastroesophageal Reflux Disease, Adult When you have gastroesophageal reflux disease (GERD), the foods you eat and your eating habits are very important. Choosing the right foods can help ease the discomfort of GERD. Consider working with a diet and nutrition specialist (dietitian) to help you make healthy food choices. What general guidelines should I follow? Eating plan  Choose healthy foods low in fat, such as fruits, vegetables, whole grains, low-fat dairy products, and lean meat, fish, and poultry.  Eat frequent, small meals instead of three large meals each day. Eat your meals slowly, in a relaxed setting. Avoid bending over or lying down until 2-3 hours after eating.  Limit high-fat foods such as fatty meats or fried foods.  Limit your intake of oils, butter, and shortening to less than 8 teaspoons each day.  Avoid the following: ? Foods that cause symptoms. These may be different for  different people. Keep a food diary to keep track of foods that cause symptoms. ? Alcohol. ? Drinking large amounts of liquid with meals. ? Eating meals during the 2-3 hours before bed.  [place] foods using methods other than frying. This may include baking, grilling, or broiling. Lifestyle   Maintain a healthy weight. Ask your health care provider what weight is healthy for you. If you need to Ferrell weight, work with your health care provider to do so safely.  Exercise for at least 30 minutes on 5 or more days each week, or as told by your health care provider.  Avoid wearing clothes that fit tightly around your waist and chest.  Do not use any products that contain nicotine or tobacco, such as cigarettes and e-cigarettes. If you need help quitting, ask your health care provider.  Sleep with the head of your bed raised. Use a wedge under the mattress or blocks under the bed frame to raise the head of the bed. What foods are not recommended? The items listed may not be a complete list. Talk with your dietitian about what dietary choices are best for you. Grains Pastries or quick breads with added fat. [place] toast. Vegetables Deep fried vegetables. [place] fries. Any vegetables prepared with added fat. Any vegetables that cause symptoms. For some people this may include tomatoes and tomato products, chili peppers, onions and garlic, and horseradish. Fruits Any fruits prepared with added fat. Any fruits that  cause symptoms. For some people this may include citrus fruits, such as oranges, grapefruit, pineapple, and lemons. Meats and other protein foods High-fat meats, such as fatty beef or pork, hot dogs, ribs, ham, sausage, salami and bacon. Fried meat or protein, including fried fish and fried chicken. Nuts and nut butters. Dairy Whole milk and chocolate milk. Sour cream. Cream. Ice cream. Cream cheese. Milk shakes. Beverages Coffee and tea, with or without caffeine. Carbonated beverages.  Sodas. Energy drinks. Fruit juice made with acidic fruits (such as orange or grapefruit). Tomato juice. Alcoholic drinks. Fats and oils Butter. Margarine. Shortening. Ghee. Sweets and desserts Chocolate and cocoa. Donuts. Seasoning and other foods Pepper. Peppermint and spearmint. Any condiments, herbs, or seasonings that cause symptoms. For some people, this may include curry, hot sauce, or vinegar-based salad dressings. Summary  When you have gastroesophageal reflux disease (GERD), food and lifestyle choices are very important to help ease the discomfort of GERD.  Eat frequent, small meals instead of three large meals each day. Eat your meals slowly, in a relaxed setting. Avoid bending over or lying down until 2-3 hours after eating.  Limit high-fat foods such as fatty meat or fried foods. This information is not intended to replace advice given to you by your health care provider. Make sure you discuss any questions you have with your health care provider. Document Released: 05/01/2005 Document Revised: 05/02/2016 Document Reviewed: 05/02/2016 Elsevier Interactive Patient Education  2017 [employer].

## 2017-03-06 NOTE — Telephone Encounter (Signed)
Pt request Shingles Rx for both her & husband Kimberly Ferrell (DOB[birth year])---advised Provider usually writes prescriptions for patients to take to pharmacy of their choice for actual shot. --glh

## 2017-03-07 MED ORDER — ZOSTER VAC RECOMB ADJUVANTED 50 MCG/0.5ML IM SUSR: 0.5000 mL | Freq: Once | INTRAMUSCULAR | 0 refills | Status: AC

## 2017-03-07 NOTE — Telephone Encounter (Signed)
RX for vaccine was sent to pharmacy in chart for both patients.  MPulliam, CMA/RT(R)

## 2017-03-22 DIAGNOSIS — Z96651 Presence of right artificial knee joint: Secondary | ICD-10-CM | POA: Diagnosis not present

## 2017-03-22 DIAGNOSIS — Z471 Aftercare following joint replacement surgery: Secondary | ICD-10-CM | POA: Diagnosis not present

## 2017-03-22 DIAGNOSIS — M7051 Other bursitis of knee, right knee: Secondary | ICD-10-CM | POA: Diagnosis not present

## 2017-04-09 ENCOUNTER — Ambulatory Visit (INDEPENDENT_AMBULATORY_CARE_PROVIDER_SITE_OTHER): Payer: Medicare Other | Admitting: Family Medicine

## 2017-04-09 ENCOUNTER — Encounter: Payer: Self-pay | Admitting: Family Medicine

## 2017-04-09 VITALS — BP 141/63 | HR 64 | Ht 65.0 in | Wt 250.0 lb

## 2017-04-09 DIAGNOSIS — G459 Transient cerebral ischemic attack, unspecified: Secondary | ICD-10-CM | POA: Diagnosis not present

## 2017-04-09 DIAGNOSIS — R7302 Impaired glucose tolerance (oral): Secondary | ICD-10-CM

## 2017-04-09 DIAGNOSIS — Z6841 Body Mass Index (BMI) 40.0 and over, adult: Secondary | ICD-10-CM

## 2017-04-09 DIAGNOSIS — E559 Vitamin D deficiency, unspecified: Secondary | ICD-10-CM | POA: Diagnosis not present

## 2017-04-09 DIAGNOSIS — R5383 Other fatigue: Secondary | ICD-10-CM

## 2017-04-09 DIAGNOSIS — K219 Gastro-esophageal reflux disease without esophagitis: Secondary | ICD-10-CM | POA: Diagnosis not present

## 2017-04-09 DIAGNOSIS — I10 Essential (primary) hypertension: Secondary | ICD-10-CM | POA: Diagnosis not present

## 2017-04-09 MED ORDER — BISOPROLOL-HYDROCHLOROTHIAZIDE 5-6.25 MG PO TABS: 1.0000 | ORAL_TABLET | Freq: Every day | ORAL | 1 refills | Status: DC

## 2017-04-09 MED ORDER — VALSARTAN 40 MG PO TABS: 40.0000 mg | ORAL_TABLET | Freq: Every day | ORAL | 1 refills | Status: DC

## 2017-04-09 NOTE — Assessment & Plan Note (Signed)
D/c omeprazole since not helping with sx  -Patient prefers to wait for now on consultation for swallow eval and/or gastroenterology consult.  She will try meats that are ground such as ground [place], ground chicken, ground beef etc. and see if those are easier to go down on her.    -She will let us know if she needs referral

## 2017-04-09 NOTE — Progress Notes (Addendum)
Impression and Recommendations:    1. Essential hypertension   2. BMI 40.0-44.9, adult ([place])   3. TIA (transient ischemic attack)   4. Glucose intolerance (impaired glucose tolerance) : A1c elevated   5. Gastroesophageal reflux disease, esophagitis presence not specified   6. Vitamin D deficiency   7. Fatigue, unspecified type    1)  Lifestyle changes such as dash diet and engaging in a regular exercise program discussed with patient.  Educational handouts provided  Ambulatory BP monitoring encouraged. Keep log and bring in next OV  -Add ARB- Valsartan medication(s).   Also, risks and benefits of medications discussed with patient, including alternative treatments.   Encouraged patient to read drug information handouts to further educate self about the medicine prior to starting it.   Contact us prior with any Q's/ concerns.  2) Explained to patient what BMI refers to, and what it means medically.    Told patient to think about it as a "medical risk stratification measurement" and how increasing BMI is associated with increasing risk/ or worsening state of various diseases such as hypertension, hyperlipidemia, diabetes, premature OA, depression etc.  American Heart Association guidelines for healthy diet, basically Mediterranean diet, and exercise guidelines of 30 minutes 5 days per week or more discussed in detail.  Health counseling performed.  All questions answered.  3)  no longer with SX dizziness, will cont to monitor  4)  - Counseled patient on pathophysiology of the disease process of Pre-DM.   Stressed importance of dietary and lifestyle modifications as first line, in addition to discussing the risks and benefits of metformin.    - Handouts provided.    - Anticipatory guidance given.   - Recheck A1c periodically  - Encouraged to return to clinic or call the office with any further questions or concerns.  5)  Discussed with patient treatment options including  over-the-counter medications and prescription ones. She wishes to cont meds for now.    In addition, discussed with patient lifestyle modification including not eating within 2 hours of lying down, avoiding trigger foods, decrease caffeine intake, decrease abdominal girth (if applicable), no tobacco cessation (if applicable).  She DECLINES further imaging (swallow study) or referral for further eval of her choking sensation. She will let us know if she changes her mind or sx W  6)  Cont to monitor levels  7) encourgaed wt loss, inc activity etc. COunseling done  Pt was in the office today for 40+ minutes, with over 50% time spent in face to face counseling of patients various medical conditions, treatment plans of those medical conditions including medicine management and lifestyle modification, strategies to improve health and well being; and in coordination of care. SEE ABOVE FOR DETAILS     Education and routine counseling performed. Handouts provided.    Orders Placed This Encounter  Procedures  . Comprehensive metabolic panel  . Hemoglobin A1c  . VITAMIN D 25 Hydroxy (Vit-D Deficiency, Fractures)  . TSH     Return in about 6 weeks (around 05/21/2017) for 6 weeks or so follow-up blood pressure started valsartan.  The patient was counseled, risk factors were discussed, anticipatory guidance given.  Gross side effects, risk and benefits, and alternatives of medications discussed with patient.  Patient is aware that all medications have potential side effects and we are unable to predict every side effect or drug-drug interaction that may occur.  Expresses verbal understanding and consents to current therapy plan and treatment regimen.  Please  see AVS handed out to patient at the end of our visit for further patient instructions/ counseling done pertaining to today's office visit.    Note: This document was prepared using Dragon voice recognition software and may include  unintentional dictation errors.     Subjective:    Chief Complaint  Patient presents with  . Follow-up    HPI: Kimberly Ferrell is a 76 y.o. female who presents to [place] [facility] today for follow up for HTN.     Problems    1)    Gerd (Gastroesophageal Reflux Disease)   Last office visit we started her on omeprazole for her choking sensation she gets when she is eating meat.  She states that in her family and everybody starts to choke on thicker meats and all. Feels like it's getting stuck in there.   Omeprazole started last OV- no difference in sx at all.     2) -H/O dizziness:  Patient has not had any episodes of dizziness since I last saw her.  She had seen Dr. Charlene Brooke of Neuro about a year ago for her TIA versus dizziness episodes.  He did not feel neurological in etiology but suggested she follow-up with cardiology.  Patient states that her symptoms have not been bad lately and she will continue to monitor  3) HTN:  Last office visit a proximally 1 month ago saw patient for blood pressure elevation and GERD.  Started her on omeprazole 20 mg daily and doubled her Ziac.  She was told to "return for 3-4 wks- bring in BP log, reck after inc in BP med- A1c, CMP, TSh, Vit D."  -  Her blood pressure has (not) been controlled at home.- She is not checking   - Patient reports good compliance with blood pressure medications  - Denies medication S-E   - Smoking Status noted   - She denies new onset of: chest pain, exercise intolerance, shortness of breath, dizziness, visual changes, headache, lower extremity swelling or claudication.   Today their BP is BP: (!) 141/63   Last 3 blood pressure readings in our office are as follows: BP Readings from Last 3 Encounters:  04/09/17 (!) 141/63  03/06/17 (!) 160/89  11/02/16 (!) 161/83    Pulse Readings from Last 3 Encounters:  04/09/17 64  03/06/17 61  11/02/16 72    Filed Weights   04/09/17 0940    Weight: 250 lb (113.4 kg)      Patient Care Team    Relationship Specialty Notifications Start End  Mellody Dance, DO PCP - General Family Medicine  01/31/16   Gaynelle Arabian, MD Consulting Physician Orthopedic Surgery  01/31/16   Druscilla Brownie, MD Consulting Physician Dermatology  01/31/16   Anastasio Auerbach, MD Consulting Physician Gynecology  02/06/16   Pieter Partridge, DO Consulting Physician Neurology  11/02/16      Lab Results  Component Value Date   CREATININE 0.70 04/09/2017   BUN 9 04/09/2017   NA 144 04/09/2017   K 4.1 04/09/2017   CL 106 04/09/2017   CO2 24 04/09/2017    Lab Results  Component Value Date   CHOL 122 11/10/2016   CHOL 136 04/16/2013    Lab Results  Component Value Date   HDL 40 11/10/2016   HDL 33.30 (L) 04/16/2013    Lab Results  Component Value Date   LDLCALC 62 11/10/2016   [place] 80 04/16/2013    Lab Results  Component  Value Date   TRIG 98 11/10/2016   TRIG 116.0 04/16/2013    Lab Results  Component Value Date   CHOLHDL 3.1 11/10/2016   CHOLHDL 4 04/16/2013    No results found for: LDLDIRECT ===================================================================   Patient Active Problem List   Diagnosis Date Noted  . BMI 40.0-44.9, adult ([place]) 11/17/2016    Priority: High  . HTN (hypertension) 06/15/2011    Priority: High  . Glucose intolerance (impaired glucose tolerance) : A1c elevated 11/17/2016    Priority: Medium  . TIA (transient ischemic attack) 01/31/2016    Priority: Medium  . Vitamin D deficiency 11/17/2016    Priority: Low  . GERD (gastroesophageal reflux disease) 11/02/2016    Priority: Low  . OA (osteoarthritis) of knee 05/23/2013    Priority: Low  . Myalgia and myositis 01/30/2012    Priority: Low  . Obesity, Class III, BMI 40-49.9 (morbid obesity) ([place]) 03/06/2017  . Pre-operative examination 07/31/2016  . Vaginal mass 07/23/2014  . OAB (overactive bladder) 04/16/2013  . Acute bronchitis  04/16/2013  . Post-nasal drip 05/30/2012  . Finger injury 05/30/2012  . Lung nodule seen on imaging study 01/23/2012  . Flank pain 01/22/2012  . Radicular low back pain 01/22/2012  . Rosacea 06/25/2011  . Back pain 06/15/2011  . Urinary frequency 06/15/2011     Past Medical History:  Diagnosis Date  . Arthritis   . Cancer ([place])    skin cancer removed from right forehead  . Chicken pox as child  . Colon polyps   . Complication of anesthesia    "hard to wake up"  . GERD (gastroesophageal reflux disease)    occasional  . Hemorrhoids    "sometimes"  . Hypertension   . Lung nodule seen on imaging study    noted 8/13- needs CT f/u in 1 year  . Mononucleosis   . Pneumonia    hx of  . PONV (postoperative nausea and vomiting)   . Rosacea   . TIA (transient ischemic attack) 2007   from IV dye, no residual, passed out and was told by RN that it was TIA     Past Surgical History:  Procedure Laterality Date  . ABDOMINAL HYSTERECTOMY  1993  . BREAST BIOPSY Left 2004   [place]   . CHOLECYSTECTOMY  1992  . HEMORRHOID SURGERY  1960's  . PARTIAL KNEE ARTHROPLASTY Right 08/09/2016   Procedure: RIGHT UNICOMPARTMENTAL KNEE;  Surgeon: Gaynelle Arabian, MD;  Location: [facility];  Service: Orthopedics;  Laterality: Right;  requests 1hr; Adductor Block  . TOTAL KNEE ARTHROPLASTY Left 05/23/2013   Procedure: LEFT TOTAL KNEE ARTHROPLASTY;  Surgeon: Gearlean Alf, MD;  Location: [facility];  Service: Orthopedics;  Laterality: Left;     Family History  Problem Relation Age of Onset  . Cancer Mother 98       colon  . Sudden death Father 48       suicide  . Stroke Maternal Grandmother 78  . Hypertension Neg Hx   . Hyperlipidemia Neg Hx   . Heart attack Neg Hx   . Diabetes Neg Hx   . Breast cancer Neg Hx      Social History   Substance and Sexual Activity  Drug Use No  ,  Social History   Substance and Sexual Activity  Alcohol Use No  ,  Social History   Tobacco Use  Smoking  Status Never Smoker  Smokeless Tobacco Never Used     Allergies  Allergen Reactions  .  Other Other (See Comments)    IV dye, heart stopped  . Amlodipine Nausea And Vomiting    Dizziness     Review of Systems:   General:  Denies fever, chills Optho/Auditory:   Denies visual changes, blurred vision Respiratory:   Denies SOB, cough, wheeze, DIB  Cardiovascular:   Denies chest pain, palpitations, painful respirations Gastrointestinal:   Denies nausea, vomiting, diarrhea.  Endocrine:     Denies new hot or cold intolerance Musculoskeletal:  Denies joint swelling, gait issues, or new unexplained myalgias/ arthralgias Skin:  Denies rash, suspicious lesions  Neurological:    Denies dizziness, unexplained weakness, numbness  Psychiatric/Behavioral:   Denies mood changes  Objective:    Blood pressure (!) 141/63, pulse 64, height 5\' 5"  (1.651 m), weight 250 lb (113.4 kg).  Body mass index is 41.6 kg/m.  General: Well Developed, well nourished, and in no acute distress.  HEENT: Normocephalic, atraumatic, pupils equal round reactive to light, neck supple, No carotid bruits, no JVD Skin: Warm and dry, cap RF less 2 sec Cardiac: Regular rate and rhythm, S1, S2 WNL's, no murmurs rubs or gallops Respiratory: ECTA B/L, Not using accessory muscles, speaking in full sentences. NeuroM-Sk: Ambulates w/o assistance, moves ext * 4 w/o difficulty, sensation grossly intact.  Ext: edema b/l lower ext Psych: No HI/SI, judgement and insight good, Euthymic mood. Full Affect.

## 2017-04-09 NOTE — Patient Instructions (Addendum)
-Bring in blood pressure log next office visit  How to Take Your Blood Pressure  Blood pressure is a measurement of how strongly your blood is pressing against the walls of your arteries. Arteries are blood vessels that carry blood from your heart throughout your body. Your health care provider takes your blood pressure at each office visit. You can also take your own blood pressure at home with a blood pressure machine. You may need to take your own blood pressure:  To confirm a diagnosis of high blood pressure (hypertension).  To monitor your blood pressure over time.  To make sure your blood pressure medicine is working.  Supplies needed: To take your blood pressure, you will need a blood pressure machine. You can buy a blood pressure machine, or blood pressure monitor, at most drugstores or online. There are several types of home blood pressure monitors. When choosing one, consider the following:  Choose a monitor that has an arm cuff.  Choose a monitor that wraps snugly around your upper arm. You should be able to fit only one finger between your arm and the cuff.  Do not choose a monitor that measures your blood pressure from your wrist or finger.  Your health care provider can suggest a reliable monitor that will meet your needs. How to prepare To get the most accurate reading, avoid the following for 30 minutes before you check your blood pressure:  Drinking caffeine.  Drinking alcohol.  Eating.  Smoking.  Exercising.  Five minutes before you check your blood pressure:  Empty your bladder.  Sit quietly without talking in a dining chair, rather than in a soft couch or armchair.  How to take your blood pressure To check your blood pressure, follow the instructions in the manual that came with your blood pressure monitor. If you have a digital blood pressure monitor, the instructions may be as follows: 1. Sit up straight. 2. Place your feet on the floor. Do not cross  your ankles or legs. 3. Rest your left arm at the level of your heart on a table or desk or on the arm of a chair. 4. Pull up your shirt sleeve. 5. Wrap the blood pressure cuff around the upper part of your left arm, 1 inch (2.5 cm) above your elbow. It is best to wrap the cuff around bare skin. 6. Fit the cuff snugly around your arm. You should be able to place only one finger between the cuff and your arm. 7. Position the cord inside the groove of your elbow. 8. Press the power button. 9. Sit quietly while the cuff inflates and deflates. 10. Read the digital reading on the monitor screen and write it down (record it). 11. Wait 2-3 minutes, then repeat the steps, starting at step 1.  What does my blood pressure reading mean? A blood pressure reading consists of a higher number over a lower number. Ideally, your blood pressure should be below 120/80. The first ("top") number is called the systolic pressure. It is a measure of the pressure in your arteries as your heart beats. The second ("bottom") number is called the diastolic pressure. It is a measure of the pressure in your arteries as the heart relaxes. Blood pressure is classified into four stages. The following are the stages for adults who do not have a short-term serious illness or a chronic condition. Systolic pressure and diastolic pressure are measured in a unit called mm Hg. Normal  Systolic pressure: below 518.  Diastolic  pressure: below 80. Elevated  Systolic pressure: 063-016.  Diastolic pressure: below 80. Hypertension stage 1  Systolic pressure: 010-932.  Diastolic pressure: 35-57. Hypertension stage 2  Systolic pressure: 322 or above.  Diastolic pressure: 90 or above. You can have prehypertension or hypertension even if only the systolic or only the diastolic number in your reading is higher than normal. Follow these instructions at home:  Check your blood pressure as often as recommended by your health care  provider.  Take your monitor to the next appointment with your health care provider to make sure: ? That you are using it correctly. ? That it provides accurate readings.  Be sure you understand what your goal blood pressure numbers are.  Tell your health care provider if you are having any side effects from blood pressure medicine. Contact a health care provider if:  Your blood pressure is consistently high. Get help right away if:  Your systolic blood pressure is higher than 180.  Your diastolic blood pressure is higher than 110. This information is not intended to replace advice given to you by your health care provider. Make sure you discuss any questions you have with your health care provider. Document Released: 10/08/2015 Document Revised: 12/21/2015 Document Reviewed: 10/08/2015 Elsevier Interactive Patient Education  [employer].

## 2017-04-10 LAB — TSH: TSH: 1.6 u[IU]/mL (ref 0.450–4.500)

## 2017-04-10 LAB — COMPREHENSIVE METABOLIC PANEL
A/G RATIO: 1.5 (ref 1.2–2.2)
ALT: 33 IU/L — AB (ref 0–32)
AST: 38 IU/L (ref 0–40)
Albumin: 4 g/dL (ref 3.5–4.8)
Alkaline Phosphatase: 87 IU/L (ref 39–117)
BILIRUBIN TOTAL: 0.6 mg/dL (ref 0.0–1.2)
BUN/Creatinine Ratio: 13 (ref 12–28)
BUN: 9 mg/dL (ref 8–27)
CALCIUM: 9.3 mg/dL (ref 8.7–10.3)
CHLORIDE: 106 mmol/L (ref 96–106)
CO2: 24 mmol/L (ref 20–29)
Creatinine, Ser: 0.7 mg/dL (ref 0.57–1.00)
GFR calc non Af Amer: 84 mL/min/{1.73_m2} (ref >59)
GFR, EST AFRICAN AMERICAN: 97 mL/min/{1.73_m2} (ref >59)
GLUCOSE: 108 mg/dL — AB (ref 65–99)
Globulin, Total: 2.7 g/dL (ref 1.5–4.5)
POTASSIUM: 4.1 mmol/L (ref 3.5–5.2)
Sodium: 144 mmol/L (ref 134–144)
TOTAL PROTEIN: 6.7 g/dL (ref 6.0–8.5)

## 2017-04-10 LAB — VITAMIN D 25 HYDROXY (VIT D DEFICIENCY, FRACTURES): Vit D, 25-Hydroxy: 23.4 ng/mL — ABNORMAL LOW (ref 30.0–100.0)

## 2017-04-10 LAB — HEMOGLOBIN A1C
ESTIMATED AVERAGE GLUCOSE: 123 mg/dL
HEMOGLOBIN A1C: 5.9 % — AB (ref 4.8–5.6)

## 2017-04-12 ENCOUNTER — Telehealth: Payer: Self-pay | Admitting: Family Medicine

## 2017-04-12 NOTE — Telephone Encounter (Signed)
Patient states rcvd call from medical assistant to call office--glh

## 2017-04-13 ENCOUNTER — Other Ambulatory Visit: Payer: Self-pay

## 2017-04-13 DIAGNOSIS — E559 Vitamin D deficiency, unspecified: Secondary | ICD-10-CM

## 2017-04-13 MED ORDER — VITAMIN D (ERGOCALCIFEROL) 1.25 MG (50000 UNIT) PO CAPS
50000.0000 [IU] | ORAL_CAPSULE | ORAL | 3 refills | Status: DC
Start: 2017-04-13 — End: 2017-04-13

## 2017-04-13 MED ORDER — VITAMIN D (ERGOCALCIFEROL) 1.25 MG (50000 UNIT) PO CAPS: 50000.0000 [IU] | ORAL_CAPSULE | ORAL | 0 refills | Status: DC

## 2017-04-13 NOTE — Telephone Encounter (Signed)
Sent small amount into local pharmacy and full refill into mail order. MPulliam, CMA/RT(R)

## 2017-04-13 NOTE — Telephone Encounter (Signed)
Spoke to patient about her lab results. MPulliam, CMA/RT(R)

## 2017-04-16 ENCOUNTER — Telehealth: Payer: Self-pay | Admitting: Family Medicine

## 2017-04-16 DIAGNOSIS — E559 Vitamin D deficiency, unspecified: Secondary | ICD-10-CM

## 2017-04-16 NOTE — Telephone Encounter (Signed)
Patient called to say she rcvd a call from Pharmacist @ [facility] saying [employer] does(Not) pay for Vitamin D(50,000 MG) Rx sent to them to fill by Dr. Raliegh Scarlet--  -------------------Patient wants provider to tell her what to do-- if she is to continue taking the OTC form she takes now or will provider send in Rx to local pharmacy ([facility]) ---if so (How much will it cost for the 50,000 MG?) since will be paying for it out of pocket.  --Patient request a call to home# but will be leaving at 5pm & state we may possibly tomorrow if not calling before 5pm.  --glh

## 2017-04-17 MED ORDER — VITAMIN D (ERGOCALCIFEROL) 1.25 MG (50000 UNIT) PO CAPS: 50000.0000 [IU] | ORAL_CAPSULE | ORAL | 2 refills | Status: DC

## 2017-04-17 NOTE — Telephone Encounter (Signed)
Spoke to Vivian who stated that #4 would cost $9.38.  LVM for pt with this information.  RX resent to [facility].  Per Lanier Prude, RX was supposed to be twice weekly and pt may have 2 refills.  Charyl Bigger, CMA

## 2017-04-18 ENCOUNTER — Telehealth: Payer: Self-pay | Admitting: Family Medicine

## 2017-04-18 NOTE — Telephone Encounter (Signed)
Mai from [facility] called to speak w/ nurse/provider-- Pharmacy needs (Clarification on dosage of Vitamin D 50,000 units-- instructions differ from last Rx--- pls call [phone] speak to any pharmacist. --Dion Body

## 2017-04-19 NOTE — Telephone Encounter (Signed)
Called and spoke to pharmacist and notified that dose was changed by provider to twice a week. MPulliam, CMA/RT(R)

## 2017-04-30 ENCOUNTER — Telehealth: Payer: Self-pay | Admitting: Family Medicine

## 2017-04-30 NOTE — Telephone Encounter (Signed)
Patient has rash/ breakout all on face & arms--- has quit taking this medication and resumed taking Rx prescribed by prior PCP Bisoprolol 25 MG HTCZ 5-  valsartan (DIOVAN) 40 MG tablet [[id number]]  Order Details  Dose: 40 mg Route: Oral Frequency: Daily  Indications of Use: Hypertension  Dispense Quantity: 90 tablet Refills: 1 Fills remaining: --        Sig: Take 1 tablet (40 mg total) by mouth daily.     Pt went back taking:  bisoprolol-hydrochlorothiazide (ZIAC) 5-6.25 MG tablet [[id number]]  Order Details  Dose: 1 tablet Route: Oral Frequency: Daily  Indications of Use: Hypertension  Dispense Quantity: 180 tablet Refills: 1 Fills remaining: --        Sig: Take 1 tablet by mouth daily.      ---pls call her as soon as possible.  --glh

## 2017-04-30 NOTE — Telephone Encounter (Signed)
Called left message for patient to call the office. MPulliam, CMA/RT(R)  

## 2017-05-01 ENCOUNTER — Telehealth: Payer: Self-pay | Admitting: Family Medicine

## 2017-05-01 NOTE — Telephone Encounter (Signed)
Please have her follow-up as we discussed last office visit I think it was a 2-4-week follow-up for recheck of blood pressure.  Please have her bring in her blood pressure log and we will hold off on medicine since blood pressure is well controlled at home currently.  Please make sure she brings in this log.

## 2017-05-01 NOTE — Telephone Encounter (Signed)
Patient states she was left a voice message to call office per medical assistant.  --Kimberly Ferrell

## 2017-05-01 NOTE — Telephone Encounter (Signed)
Patient notified, her appointment is 05/21/2016 and she aware to bring in log to this visit. MPulliam, CMA/RT(R)

## 2017-05-01 NOTE — Telephone Encounter (Signed)
Called patient and she states that she was having reaction to the new medication - valsartan. Patient was having red bumps on face on after starting the medication.  Patient stopped the medication 3 days ago and bumps and redness has improved greatly.  Patient is logging blood pressure at home and states that it is good and she has been keeping a log.  Please advise if patient needs to change anything.  Thanks MPulliam, CMA/RT(R)

## 2017-05-01 NOTE — Telephone Encounter (Signed)
Called patient please see other note. MPulliam, CMA/RT(R)

## 2017-05-19 ENCOUNTER — Encounter: Payer: Self-pay | Admitting: Family Medicine

## 2017-05-21 ENCOUNTER — Ambulatory Visit (INDEPENDENT_AMBULATORY_CARE_PROVIDER_SITE_OTHER): Payer: Medicare Other | Admitting: Family Medicine

## 2017-05-21 ENCOUNTER — Encounter: Payer: Self-pay | Admitting: Family Medicine

## 2017-05-21 VITALS — BP 160/93 | HR 56 | Ht 65.0 in | Wt 247.0 lb

## 2017-05-21 DIAGNOSIS — R7302 Impaired glucose tolerance (oral): Secondary | ICD-10-CM | POA: Diagnosis not present

## 2017-05-21 DIAGNOSIS — M545 Low back pain, unspecified: Secondary | ICD-10-CM

## 2017-05-21 DIAGNOSIS — M1711 Unilateral primary osteoarthritis, right knee: Secondary | ICD-10-CM

## 2017-05-21 DIAGNOSIS — G8929 Other chronic pain: Secondary | ICD-10-CM | POA: Diagnosis not present

## 2017-05-21 DIAGNOSIS — E559 Vitamin D deficiency, unspecified: Secondary | ICD-10-CM

## 2017-05-21 DIAGNOSIS — M62830 Muscle spasm of back: Secondary | ICD-10-CM | POA: Insufficient documentation

## 2017-05-21 DIAGNOSIS — I10 Essential (primary) hypertension: Secondary | ICD-10-CM | POA: Diagnosis not present

## 2017-05-21 MED ORDER — CYCLOBENZAPRINE HCL 10 MG PO TABS: 10.0000 mg | ORAL_TABLET | Freq: Three times a day (TID) | ORAL | 1 refills | Status: DC | PRN

## 2017-05-21 MED ORDER — VITAMIN D (ERGOCALCIFEROL) 1.25 MG (50000 UNIT) PO CAPS: 50000.0000 [IU] | ORAL_CAPSULE | ORAL | 3 refills | Status: DC

## 2017-05-21 MED ORDER — LISINOPRIL 10 MG PO TABS: 10.0000 mg | ORAL_TABLET | Freq: Every day | ORAL | 1 refills | Status: DC

## 2017-05-21 NOTE — Patient Instructions (Addendum)
Muscle Cramps and Spasms Muscle cramps and spasms occur when a muscle or muscles tighten and you have no control over this tightening (involuntary muscle contraction). They are a common problem and can develop in any muscle. The most common place is in the calf muscles of the leg. Muscle cramps and muscle spasms are both involuntary muscle contractions, but there are some differences between the two:  Muscle cramps are painful. They come and go and may last a few seconds to 15 minutes. Muscle cramps are often more forceful and last longer than muscle spasms.  Muscle spasms may or may not be painful. They may also last just a few seconds or much longer.  Certain medical conditions, such as diabetes or Parkinson disease, can make it more likely to develop cramps or spasms. However, cramps or spasms are usually not caused by a serious underlying problem. Common causes include:  Overexertion.  Overuse from repetitive motions, or doing the same thing over and over.  Remaining in a certain position for a long period of time.  Improper preparation, form, or technique while playing a sport or doing an activity.  Dehydration.  Injury.  Side effects of some medicines.  Abnormally low levels of the salts and ions in your blood (electrolytes), especially potassium and calcium. This could happen if you are taking water pills (diuretics) or if you are pregnant.  In many cases, the cause of muscle cramps or spasms is unknown. Follow these instructions at home:  Stay well hydrated. Drink enough fluid to keep your urine clear or pale yellow.  Try massaging, stretching, and relaxing the affected muscle.  If directed, apply heat to tight or tense muscles as often as told by your health care provider. Use the heat source that your health care provider recommends, such as a moist heat pack or a heating pad. ? Place a towel between your skin and the heat source. ? Leave the heat on for 20-30  minutes. ? Remove the heat if your skin turns bright red. This is especially important if you are unable to feel pain, heat, or cold. You may have a greater risk of getting burned.  If directed, put ice on the affected area. This may help if you are sore or have pain after a cramp or spasm. ? Put ice in a plastic bag. ? Place a towel between your skin and the bag. ? Leavethe ice on for 20 minutes, 2-3 times a day.  Take over-the-counter and prescription medicines only as told by your health care provider.  Pay attention to any changes in your symptoms. Contact a health care provider if:  Your cramps or spasms get more severe or happen more often.  Your cramps or spasms do not improve over time. This information is not intended to replace advice given to you by your health care provider. Make sure you discuss any questions you have with your health care provider. Document Released: 10/21/2001 Document Revised: 06/02/2015 Document Reviewed: 02/02/2015 Elsevier Interactive Patient Education  2018 [employer] Inc.  

## 2017-05-21 NOTE — Progress Notes (Signed)
Impression and Recommendations:    1. Chronic left-sided low back pain without sciatica   2. Muscle spasm of back   3. Essential hypertension   4. Glucose intolerance (impaired glucose tolerance) : A1c elevated   5. Osteoarthritis of right knee, unspecified osteoarthritis type   6. Vitamin D deficiency     Chronic Knee and Back Pain - Left lower back pain and muscle spasm. Educational materials provided on muscle spasms. - Currently taking alleve and tylenol, and has also taken tramadol to treat her pain. - Muscle relaxer prescribed, will begin taking it at night. Side effects of the medicine were reviewed with the patient, and she understands that it will cause drowsiness. - When moving around, patient was advised to lead with her good knee, and follow with the bad. - Motor percussion massager recommended for muscle pain. Educational materials provided to the patient, including discussion on how to begin use of the massager.  HTN - Allergic to Losartan. Did not do well on Amlodipine in the past.  - Has never tried Lisinopril - will prescribe low dose of this to try it. - Patient will continue taking Ziac as prescribed. - Pt will continue monitoring her blood pressure at home.  Glucose Inolerance - HA1c went up a little, from 5.8 to 5.9. - Patient knows to watch her sugar consumption as she is pre-diabetes.  Vitamin D Deficiency - Per labs one month ago, vitamin D went down. Patient has only been taking daily OTC, not weekly. We will refill her weekly dose so she will return to weekly and daily supplement.    Meds ordered this encounter  Medications  . lisinopril (PRINIVIL,ZESTRIL) 10 MG tablet    Sig: Take 1 tablet (10 mg total) by mouth daily.    Dispense:  90 tablet    Refill:  1  . cyclobenzaprine (FLEXERIL) 10 MG tablet    Sig: Take 1 tablet (10 mg total) by mouth 3 (three) times daily as needed for muscle spasms.    Dispense:  30 tablet    Refill:  1  .  Vitamin D, Ergocalciferol, (DRISDOL) 50000 units CAPS capsule    Sig: Take 1 capsule (50,000 Units total) by mouth 2 (two) times a week.    Dispense:  24 capsule    Refill:  3    Gross side effects, risk and benefits, and alternatives of medications and treatment plan in general discussed with patient.  Patient is aware that all medications have potential side effects and we are unable to predict every side effect or drug-drug interaction that may occur.   Patient will call with any questions prior to using medication if they have concerns.  Expresses verbal understanding and consents to current therapy and treatment regimen.  No barriers to understanding were identified.  Red flag symptoms and signs discussed in detail.  Patient expressed understanding regarding what to do in case of emergency\urgent symptoms  Please see AVS handed out to patient at the end of our visit for further patient instructions/ counseling done pertaining to today's office visit.   Return in about 4 weeks (around 06/18/2017) for started new BP med- f/up 4 wks, bring BP log.    Note: This note was prepared with assistance of Dragon voice recognition software. Occasional wrong-word or sound-a-like substitutions may have occurred due to the inherent limitations of voice recognition software.   This document serves as a record of services personally performed by Mellody Dance, DO. It was  created on her behalf by [name], a trained medical scribe. The creation of this record is based on the scribe's personal observations and the provider's statements to them.   I have reviewed the above medical documentation for accuracy and completeness and I concur.  Mellody Dance 05/21/17 2:59  PM   --------------------------------------------------------------------------------------------------------------------------------------------------------------------------------------------------------------------------------------------   Subjective:     HPI: Kimberly Ferrell is a 77 y.o. female who presents to [place] [facility] today for issues as discussed below.   She is accompanied today by her husband.   Chronic Knee and Back Pain Describes her pain as burning and going down into her back and hip. She is going back to Dr. Wynelle Link on the 26th of January for further orthopedic evaluation. Her husband notes that she has been helping "carry him around," and wonders if this has exacerbated her back pain. She tried to lift him out of the mud after he fell recently.  To manage her pain, she currently takes aleve, tylenol, and tramadol- given to her by Ortho  She confirms that she is icing her knee, about twice a day, to treat the pain.  HTN Reports that her blood pressure is 153/79-80, with pulse in the 60's. She denies dizziness, lightheadedness, or feeling like she's going to pass out. ROS grossly neg  Vitamin D Deficiency She has not been taking her supplements as prescribed - only taking daily OTC, not weekly.  Her husband says "her nerves are about shot" from all of the recent holiday activities. She notes that she rarely gets any sleep at night, and takes tramadol to help her sleep-but not sleeping well due to the pain.   Wt Readings from Last 3 Encounters:  05/21/17 247 lb (112 kg)  04/09/17 250 lb (113.4 kg)  03/06/17 245 lb 14.4 oz (111.5 kg)   BP Readings from Last 3 Encounters:  05/21/17 (!) 154/90  04/09/17 (!) 141/63  03/06/17 (!) 160/89   Pulse Readings from Last 3 Encounters:  05/21/17 (!) 56  04/09/17 64  03/06/17 61   BMI Readings from Last 3 Encounters:  05/21/17 41.10 kg/m  04/09/17 41.60 kg/m  03/06/17 40.92 kg/m      Patient Care Team    Relationship Specialty Notifications Start End  Mellody Dance, DO PCP - General Family Medicine  01/31/16   Gaynelle Arabian, MD Consulting Physician Orthopedic Surgery  01/31/16   Druscilla Brownie, MD Consulting Physician Dermatology  01/31/16   Anastasio Auerbach, MD Consulting Physician Gynecology  02/06/16   Pieter Partridge, [place] Physician Neurology  11/02/16      Patient Active Problem List   Diagnosis Date Noted  . BMI 40.0-44.9, adult ([place]) 11/17/2016    Priority: High  . HTN (hypertension) 06/15/2011    Priority: High  . Glucose intolerance (impaired glucose tolerance) : A1c elevated 11/17/2016    Priority: Medium  . TIA (transient ischemic attack) 01/31/2016    Priority: Medium  . Vitamin D deficiency 11/17/2016    Priority: Low  . GERD (gastroesophageal reflux disease) 11/02/2016    Priority: Low  . OA (osteoarthritis) of knee 05/23/2013    Priority: Low  . Myalgia and myositis 01/30/2012    Priority: Low  . Muscle spasm of back 05/21/2017  . Obesity, Class III, BMI 40-49.9 (morbid obesity) ([place]) 03/06/2017  . Pre-operative examination 07/31/2016  . Vaginal mass 07/23/2014  . OAB (overactive bladder) 04/16/2013  . Acute bronchitis 04/16/2013  . Post-nasal drip 05/30/2012  .  Finger injury 05/30/2012  . Lung nodule seen on imaging study 01/23/2012  . Flank pain 01/22/2012  . Radicular low back pain 01/22/2012  . Rosacea 06/25/2011  . Back pain 06/15/2011  . Urinary frequency 06/15/2011    Past Medical history, Surgical history, Family history, Social history, Allergies and Medications have been entered into the medical record, reviewed and changed as needed.    Current Meds  Medication Sig  . aspirin EC 81 MG tablet Take 81 mg by mouth daily.  . bisoprolol-hydrochlorothiazide (ZIAC) 5-6.25 MG tablet Take 1 tablet by mouth daily.  Marland Kitchen omeprazole (PRILOSEC) 20 MG capsule Take 1 capsule (20 mg total) by mouth daily.  . Vitamin  D, Ergocalciferol, (DRISDOL) 50000 units CAPS capsule Take 1 capsule (50,000 Units total) by mouth 2 (two) times a week.  . [DISCONTINUED] valsartan (DIOVAN) 40 MG tablet Take 1 tablet (40 mg total) by mouth daily.  . [DISCONTINUED] Vitamin D, Ergocalciferol, (DRISDOL) 50000 units CAPS capsule Take 1 capsule (50,000 Units total) by mouth 2 (two) times a week.    Allergies:  Allergies  Allergen Reactions  . Other Other (See Comments)    IV dye, heart stopped  . Amlodipine Nausea And Vomiting    Dizziness  . Valsartan Rash     Review of Systems:  A fourteen system review of systems was performed and found to be positive as per HPI.   Objective:   Blood pressure (!) 154/90, pulse (!) 56, height 5\' 5"  (1.651 m), weight 247 lb (112 kg), SpO2 97 %. Body mass index is 41.1 kg/m. General:  Well Developed, well nourished, appropriate for stated age.  Neuro:  Alert and oriented,  extra-ocular muscles intact  HEENT:  Normocephalic, atraumatic, neck supple, no carotid bruits appreciated  Skin:  no gross rash, warm, pink. Cardiac:  RRR, S1 S2 Respiratory:  ECTA B/L and A/P, Not using accessory muscles, speaking in full sentences- unlabored. Vascular:  Ext warm, no cyanosis apprec.; cap RF less 2 sec. Psych:  No HI/SI, judgement and insight good, Euthymic mood. Full Affect. Back: No bony tenderness to palpation of the lumbar spine or sacral spine.  Left paravertebral muscle spasms L2 through 5 greater than right.

## 2017-05-23 ENCOUNTER — Telehealth: Payer: Self-pay | Admitting: Family Medicine

## 2017-05-23 NOTE — Telephone Encounter (Signed)
Called patient and left message to call the office back. MPulliam, CMA/RT(R)  

## 2017-05-23 NOTE — Telephone Encounter (Signed)
Patient called saying she received a call from [facility] and the Vit D prescription was not going to be covered and will cost her over $50. She wants to talk to someone about other possible options that will be cheaper. Please advise

## 2017-05-24 ENCOUNTER — Telehealth: Payer: Self-pay | Admitting: Family Medicine

## 2017-05-24 NOTE — Telephone Encounter (Signed)
Called and spoke to the patient she is going to check cash price a [facility] and call me back.  I informed the patient that through [facility] the medication is 11.21.  Patient will let me know where she wants meds and I will resend. MPulliam, CMA/RT(R)

## 2017-05-24 NOTE — Telephone Encounter (Signed)
Called the patient and discussed options, she will call for cash prices and will call me back. MPulliam, CMA/RT(R)

## 2017-05-24 NOTE — Telephone Encounter (Signed)
Patient called back and said to call her at your earliest moment

## 2017-05-25 ENCOUNTER — Telehealth: Payer: Self-pay | Admitting: Family Medicine

## 2017-05-25 NOTE — Telephone Encounter (Signed)
[facility] called to give notice of possible drug interaction for Lisinopril & Losartan-- Pls be aware my cause harm to patient if continued combination used.--forwarding msg to provider. --glh

## 2017-05-26 NOTE — Telephone Encounter (Signed)
Please route to CMAs, otherwise see pt's med list for her CURRENT medications she is taking

## 2017-06-19 DIAGNOSIS — M1711 Unilateral primary osteoarthritis, right knee: Secondary | ICD-10-CM | POA: Diagnosis not present

## 2017-06-20 ENCOUNTER — Encounter: Payer: Self-pay | Admitting: Family Medicine

## 2017-06-20 ENCOUNTER — Ambulatory Visit (INDEPENDENT_AMBULATORY_CARE_PROVIDER_SITE_OTHER): Payer: Medicare Other | Admitting: Family Medicine

## 2017-06-20 VITALS — BP 140/88 | HR 70 | Ht 65.0 in | Wt 244.5 lb

## 2017-06-20 DIAGNOSIS — Z6841 Body Mass Index (BMI) 40.0 and over, adult: Secondary | ICD-10-CM

## 2017-06-20 DIAGNOSIS — R140 Abdominal distension (gaseous): Secondary | ICD-10-CM | POA: Insufficient documentation

## 2017-06-20 DIAGNOSIS — I10 Essential (primary) hypertension: Secondary | ICD-10-CM

## 2017-06-20 DIAGNOSIS — M1711 Unilateral primary osteoarthritis, right knee: Secondary | ICD-10-CM | POA: Diagnosis not present

## 2017-06-20 DIAGNOSIS — E559 Vitamin D deficiency, unspecified: Secondary | ICD-10-CM

## 2017-06-20 NOTE — Patient Instructions (Addendum)
Since you don't have the lisinopril from pharmacy yet- don't start it since BP's at home are well controlled.  Continue to monitor  -    Managing Your Hypertension Hypertension is commonly called high blood pressure. This is when the force of your blood pressing against the walls of your arteries is too strong. Arteries are blood vessels that carry blood from your heart throughout your body. Hypertension forces the heart to work harder to pump blood, and may cause the arteries to become narrow or stiff. Having untreated or uncontrolled hypertension can cause heart attack, stroke, kidney disease, and other problems. What are blood pressure readings? A blood pressure reading consists of a higher number over a lower number. Ideally, your blood pressure should be below 120/80. The first ("top") number is called the systolic pressure. It is a measure of the pressure in your arteries as your heart beats. The second ("bottom") number is called the diastolic pressure. It is a measure of the pressure in your arteries as the heart relaxes. What does my blood pressure reading mean? Blood pressure is classified into four stages. Based on your blood pressure reading, your health care provider may use the following stages to determine what type of treatment you need, if any. Systolic pressure and diastolic pressure are measured in a unit called mm Hg. Normal  Systolic pressure: below 326.  Diastolic pressure: below 80. Elevated  Systolic pressure: 712-458.  Diastolic pressure: below 80. Hypertension stage 1  Systolic pressure: 099-833.  Diastolic pressure: 82-50. Hypertension stage 2  Systolic pressure: 539 or above.  Diastolic pressure: 90 or above. What health risks are associated with hypertension? Managing your hypertension is an important responsibility. Uncontrolled hypertension can lead to:  A heart attack.  A stroke.  A weakened blood vessel (aneurysm).  Heart failure.  Kidney  damage.  Eye damage.  Metabolic syndrome.  Memory and concentration problems.  What changes can I make to manage my hypertension? Hypertension can be managed by making lifestyle changes and possibly by taking medicines. Your health care provider will help you make a plan to bring your blood pressure within a normal range. Eating and drinking  Eat a diet that is high in fiber and potassium, and low in salt (sodium), added sugar, and fat. An example eating plan is called the DASH (Dietary Approaches to Stop Hypertension) diet. To eat this way: ? Eat plenty of fresh fruits and vegetables. Try to fill half of your plate at each meal with fruits and vegetables. ? Eat whole grains, such as whole wheat pasta, brown rice, or whole grain bread. Fill about one quarter of your plate with whole grains. ? Eat low-fat diary products. ? Avoid fatty cuts of meat, processed or cured meats, and poultry with skin. Fill about one quarter of your plate with lean proteins such as fish, chicken without skin, beans, eggs, and tofu. ? Avoid premade and processed foods. These tend to be higher in sodium, added sugar, and fat.  Reduce your daily sodium intake. Most people with hypertension should eat less than 1,500 mg of sodium a day.  Limit alcohol intake to no more than 1 drink a day for nonpregnant women and 2 drinks a day for men. One drink equals 12 oz of beer, 5 oz of wine, or 1 oz of hard liquor. Lifestyle  Work with your health care provider to maintain a healthy body weight, or to lose weight. Ask what an ideal weight is for you.  Get at least  30 minutes of exercise that causes your heart to beat faster (aerobic exercise) most days of the week. Activities may include walking, swimming, or biking.  Include exercise to strengthen your muscles (resistance exercise), such as weight lifting, as part of your weekly exercise routine. Try to do these types of exercises for 30 minutes at least 3 days a  week.  Do not use any products that contain nicotine or tobacco, such as cigarettes and e-cigarettes. If you need help quitting, ask your health care provider.  Control any long-term (chronic) conditions you have, such as high cholesterol or diabetes. Monitoring  Monitor your blood pressure at home as told by your health care provider. Your personal target blood pressure may vary depending on your medical conditions, your age, and other factors.  Have your blood pressure checked regularly, as often as told by your health care provider. Working with your health care provider  Review all the medicines you take with your health care provider because there may be side effects or interactions.  Talk with your health care provider about your diet, exercise habits, and other lifestyle factors that may be contributing to hypertension.  Visit your health care provider regularly. Your health care provider can help you create and adjust your plan for managing hypertension. Will I need medicine to control my blood pressure? Your health care provider may prescribe medicine if lifestyle changes are not enough to get your blood pressure under control, and if:  Your systolic blood pressure is 130 or higher.  Your diastolic blood pressure is 80 or higher.  Take medicines only as told by your health care provider. Follow the directions carefully. Blood pressure medicines must be taken as prescribed. The medicine does not work as well when you skip doses. Skipping doses also puts you at risk for problems. Contact a health care provider if:  You think you are having a reaction to medicines you have taken.  You have repeated (recurrent) headaches.  You feel dizzy.  You have swelling in your ankles.  You have trouble with your vision. Get help right away if:  You develop a severe headache or confusion.  You have unusual weakness or numbness, or you feel faint.  You have severe pain in your chest  or abdomen.  You vomit repeatedly.  You have trouble breathing. Summary  Hypertension is when the force of blood pumping through your arteries is too strong. If this condition is not controlled, it may put you at risk for serious complications.  Your personal target blood pressure may vary depending on your medical conditions, your age, and other factors. For most people, a normal blood pressure is less than 120/80.  Hypertension is managed by lifestyle changes, medicines, or both. Lifestyle changes include weight loss, eating a healthy, low-sodium diet, exercising more, and limiting alcohol. This information is not intended to replace advice given to you by your health care provider. Make sure you discuss any questions you have with your health care provider. Document Released: 01/24/2012 Document Revised: 03/29/2016 Document Reviewed: 03/29/2016 Elsevier Interactive Patient Education  2018 [place] out the DASH diet = 1.5 Gram Low Sodium Diet   A 1.5 gram sodium diet restricts the amount of sodium in the diet to no more than 1.5 g or 1500 mg daily.  The American Heart Association recommends Americans over the age of 31 to consume no more than 1500 mg of sodium each day to reduce the risk of developing high  blood pressure.  Research also shows that limiting sodium may reduce heart attack and stroke risk.  Many foods contain sodium for flavor and sometimes as a preservative.  When the amount of sodium in a diet needs to be low, it is important to know what to look for when choosing foods and drinks.  The following includes some information and guidelines to help make it easier for you to adapt to a low sodium diet.    QUICK TIPS  Do not add salt to food.  Avoid convenience items and fast food.  Choose unsalted snack foods.  Buy lower sodium products, often labeled as "lower sodium" or "no salt added."  Check food labels to learn how much sodium is in 1 serving.  When  eating at a restaurant, ask that your food be prepared with less salt or none, if possible.    READING FOOD LABELS FOR SODIUM INFORMATION  The nutrition facts label is a good place to find how much sodium is in foods. Look for products with no more than 400 mg of sodium per serving.  Remember that 1.5 g = 1500 mg.  The food label may also list foods as:  Sodium-free: Less than 5 mg in a serving.  Very low sodium: 35 mg or less in a serving.  Low-sodium: 140 mg or less in a serving.  Light in sodium: 50% less sodium in a serving. For example, if a food that usually has 300 mg of sodium is changed to become light in sodium, it will have 150 mg of sodium.  Reduced sodium: 25% less sodium in a serving. For example, if a food that usually has 400 mg of sodium is changed to reduced sodium, it will have 300 mg of sodium.    CHOOSING FOODS  Grains  Avoid: Salted crackers and snack items. Some cereals, including instant hot cereals. Bread stuffing and biscuit mixes. Seasoned rice or pasta mixes.  Choose: Unsalted snack items. Low-sodium cereals, oats, puffed wheat and rice, shredded wheat. English muffins and bread. Pasta.  Meats  Avoid: Salted, canned, smoked, spiced, pickled meats, including fish and poultry. Bacon, ham, sausage, cold cuts, hot dogs, anchovies.  Choose: Low-sodium canned tuna and salmon. Fresh or frozen meat, poultry, and fish.  Dairy  Avoid: Processed cheese and spreads. Cottage cheese. Buttermilk and condensed milk. Regular cheese.  Choose: Milk. Low-sodium cottage cheese. Yogurt. Sour cream. Low-sodium cheese.  Fruits and Vegetables  Avoid: Regular canned vegetables. Regular canned tomato sauce and paste. Frozen vegetables in sauces. Olives. Kimberly Ferrell. Relishes. Sauerkraut.  Choose: Low-sodium canned vegetables. Low-sodium tomato sauce and paste. Frozen or fresh vegetables. Fresh and frozen fruit.  Condiments  Avoid: Canned and packaged gravies. Worcestershire sauce. Tartar  sauce. Barbecue sauce. Soy sauce. Steak sauce. Ketchup. Onion, garlic, and table salt. Meat flavorings and tenderizers.  Choose: Fresh and dried herbs and spices. Low-sodium varieties of mustard and ketchup. Lemon juice. Tabasco sauce. Horseradish.    SAMPLE 1.5 GRAM SODIUM MEAL PLAN:   Breakfast / Sodium (mg)  1 cup low-fat milk / 143 mg  1 whole-wheat English muffin / 240 mg  1 tbs heart-healthy margarine / 153 mg  1 hard-boiled egg / 139 mg  1 small orange / 0 mg  Lunch / Sodium (mg)  1 cup raw carrots / 76 mg  2 tbs no salt added peanut butter / 5 mg  2 slices whole-wheat bread / 270 mg  1 tbs jelly / 6 mg   cup red grapes /  2 mg  Dinner / Sodium (mg)  1 cup whole-wheat pasta / 2 mg  1 cup low-sodium tomato sauce / 73 mg  3 oz lean ground beef / 57 mg  1 small side salad (1 cup raw spinach leaves,  cup cucumber,  cup yellow bell pepper) with 1 tsp olive oil and 1 tsp red wine vinegar / 25 mg  Snack / Sodium (mg)  1 container low-fat vanilla yogurt / 107 mg  3 graham cracker squares / 127 mg  Nutrient Analysis  Calories: 1745  Protein: 75 g  Carbohydrate: 237 g  Fat: 57 g  Sodium: 1425 mg  Document Released: 05/01/2005 Document Revised: 01/11/2011 Document Reviewed: 08/02/2009  [facility] Patient Information 2012 [place], [place].

## 2017-06-20 NOTE — Progress Notes (Signed)
Impression and Recommendations:    1. Essential hypertension   2. Gassiness/ belching   3. BMI 40.0-44.9, adult ([place])   4. Vitamin D deficiency   5. Osteoarthritis of right knee, unspecified osteoarthritis type    1. Hypertension- -Continue monitoring your BP at home and keeping a log of it. Per pt, she was unable to fill her lisinopril prescription due to a problem with her insurance company. As she has been stable without the medication, we will stop this. -Instructed pt to have a low salt diet. Try to lose weight, exercise regularly, and monitor your diet, as these are ways to better control your BP.   2. Gassiness/belching  -Continue taking pepcid AC as-needed for symptom relief. Recommended reducing intake of gas-producing foods like broccoli and cabbage. Handouts and information provided.  3. BMI - recommended pt to lose weight.   4. Vitamin D deficiency- stable and tolerating her supplements well. Continue supplements as described.  5. OA of R knee- orthopedist is watching her closely for this. She has a follow up with him next week.   -Follow up in 6 weeks for BP management and to monitor how you are doing after lifestyle modification. Sooner if needed or problems arise.  -Encouraged pt to walk regularly on a forgiving surface. Recommend walking at the indoor track at the [employer]. Continue icing your R knee frequently, 15-20 minutes every hour.  Goal: walk 5 minutes, 3 days a week.   Education and routine counseling performed. Handouts provided.  The patient was counseled, risk factors were discussed, anticipatory guidance given.  Gross side effects, risk and benefits, and alternatives of medications discussed with patient.  Patient is aware that all medications have potential side effects and we are unable to predict every side effect or drug-drug interaction that may occur.  Expresses verbal understanding and consents to current therapy plan and treatment  regimen.  Return for f/up 6wks for BP- bring in log; minmum 62mn walking 3d/wk.  Please see AVS handed out to patient at the end of our visit for further patient instructions/ counseling done pertaining to today's office visit.    Note: This document was prepared using Dragon voice recognition software and may include unintentional dictation errors.  This document serves as a record of services personally performed by DMellody Dance DO. It was created on her behalf by NMayer Masker a trained medical scribe. The creation of this record is based on the scribe's personal observations and the provider's statements to them.   I have reviewed the above medical documentation for accuracy and completeness and I concur.  DMellody Dance03/06/19 2:59 PM    Subjective:    HPI: Kimberly KATHis a 77y.o. [facility] after starting lisinopril.    HTN From previous OV 05-21-17: - Allergic to Losartan. Did not do well on Amlodipine in the past.  - Has never tried Lisinopril - will prescribe low dose of this to try it. - Patient will continue taking Ziac as prescribed. - Pt will continue monitoring her blood pressure at home  157/85 highest, but mostly 130/80s. She is feeling good. She states she could not get her lisinopril filled due to a problem with her [employer]. She denies any new symptoms related to her BP.  HTN:  -  Her blood pressure has been controlled at home.   - Patient reports good compliance with  other blood pressure medications, but she has not started lisinopril yet.   - Denies medication S-E.   - Smoking Status noted   - She denies new onset of: chest pain, exercise intolerance, shortness of breath, dizziness, visual changes, headache, lower extremity swelling or claudication.   GI She is also complaining of belching. She states she is eating a lot of broccoli and cabbage. She has tried  taking pepcid AC with some relief.   R Knee She met with her orthopedist who discussed replacing her partial knee replacement to a total knee replacement. She also got a shot of cortisone in her knee which helped. She has not been taking her muscle relaxers. She has another follow up with him in the next week. She has been taking tramadol with relief to her pain. She states after her knee problem is fixed she wants to start Weight Watchers.  Vit D After being on vitamin D, she reports feeling more energy and feeling better in her hands.   Last 3 blood pressure readings in our office are as follows: BP Readings from Last 3 Encounters:  06/20/17 140/88  05/21/17 (!) 160/93  04/09/17 (!) 141/63    Pulse Readings from Last 3 Encounters:  06/20/17 70  05/21/17 (!) 56  04/09/17 64    Filed Weights   06/20/17 0956  Weight: 244 lb 8 oz (110.9 kg)      Patient Care Team    Relationship Specialty Notifications Start End  , , DO PCP - General Family Medicine  01/31/16   Aluisio, Frank, MD Consulting Physician Orthopedic Surgery  01/31/16   Lupton, Frederick, MD Consulting Physician Dermatology  01/31/16   Fontaine, Timothy P, MD Consulting Physician Gynecology  02/06/16   Jaffe, Adam R, DO Consulting Physician Neurology  11/02/16      Lab Results  Component Value Date   CREATININE 0.70 04/09/2017   BUN 9 04/09/2017   NA 144 04/09/2017   K 4.1 04/09/2017   CL 106 04/09/2017   CO2 24 04/09/2017    Lab Results  Component Value Date   CHOL 122 11/10/2016   CHOL 136 04/16/2013    Lab Results  Component Value Date   HDL 40 11/10/2016   HDL 33.30 (L) 04/16/2013    Lab Results  Component Value Date   LDLCALC 62 11/10/2016   LDLCALC 80 04/16/2013    Lab Results  Component Value Date   TRIG 98 11/10/2016   TRIG 116.0 04/16/2013    Lab Results  Component Value Date   CHOLHDL 3.1 11/10/2016   CHOLHDL 4 04/16/2013    No results found for:  LDLDIRECT ===================================================================   Patient Active Problem List   Diagnosis Date Noted  . BMI 40.0-44.9, adult (HCC) 11/17/2016    Priority: High  . HTN (hypertension) 06/15/2011    Priority: High  . Glucose intolerance (impaired glucose tolerance) : A1c elevated 11/17/2016    Priority: Medium  . TIA (transient ischemic attack) 01/31/2016    Priority: Medium  . Vitamin D deficiency 11/17/2016    Priority: Low  . GERD (gastroesophageal reflux disease) 11/02/2016    Priority: Low  . OA (osteoarthritis) of knee 05/23/2013    Priority: Low  . Myalgia and myositis 01/30/2012    Priority: Low  . Gassiness/ belching 06/20/2017  . Muscle spasm of back 05/21/2017  . Obesity, Class III, BMI 40-49.9 (morbid obesity) ([facility]) 03/06/2017  . Pre-operative examination 07/31/2016  . Vaginal mass 07/23/2014  . OAB (overactive bladder)   04/16/2013  . Acute bronchitis 04/16/2013  . Post-nasal drip 05/30/2012  . Finger injury 05/30/2012  . Lung nodule seen on imaging study 01/23/2012  . Flank pain 01/22/2012  . Radicular low back pain 01/22/2012  . Rosacea 06/25/2011  . Back pain 06/15/2011  . Urinary frequency 06/15/2011     Past Medical History:  Diagnosis Date  . Arthritis   . Cancer (HCC)    skin cancer removed from right forehead  . Chicken pox as child  . Colon polyps   . Complication of anesthesia    "hard to wake up"  . GERD (gastroesophageal reflux disease)    occasional  . Hemorrhoids    "sometimes"  . Hypertension   . Lung nodule seen on imaging study    noted 8/13- needs CT f/u in 1 year  . Mononucleosis   . Pneumonia    hx of  . PONV (postoperative nausea and vomiting)   . Rosacea   . TIA (transient ischemic attack) 2007   from IV dye, no residual, passed out and was told by RN that it was TIA     Past Surgical History:  Procedure Laterality Date  . ABDOMINAL HYSTERECTOMY  1993  . BREAST BIOPSY Left 2004   US Core    . CHOLECYSTECTOMY  1992  . HEMORRHOID SURGERY  1960's  . PARTIAL KNEE ARTHROPLASTY Right 08/09/2016   Procedure: RIGHT UNICOMPARTMENTAL KNEE;  Surgeon: Frank Aluisio, MD;  Location: [facility];  Service: Orthopedics;  Laterality: Right;  requests 1hr; Adductor Block  . TOTAL KNEE ARTHROPLASTY Left 05/23/2013   Procedure: LEFT TOTAL KNEE ARTHROPLASTY;  Surgeon: Frank V Aluisio, MD;  Location: [facility];  Service: Orthopedics;  Laterality: Left;     Family History  Problem Relation Age of Onset  . Cancer Mother 61       colon  . Sudden death Father 73       suicide  . Stroke Maternal Grandmother 78  . Hypertension Neg Hx   . Hyperlipidemia Neg Hx   . Heart attack Neg Hx   . Diabetes Neg Hx   . Breast cancer Neg Hx      Social History   Substance and Sexual Activity  Drug Use No  ,  Social History   Substance and Sexual Activity  Alcohol Use No  ,  Social History   Tobacco Use  Smoking Status Never Smoker  Smokeless Tobacco Never Used  ,    Current Outpatient Medications on File Prior to Visit  Medication Sig Dispense Refill  . aspirin EC 81 MG tablet Take 81 mg by mouth daily.    . bisoprolol-hydrochlorothiazide (ZIAC) 5-6.25 MG tablet Take 1 tablet by mouth daily. 180 tablet 1  . cyclobenzaprine (FLEXERIL) 10 MG tablet Take 1 tablet (10 mg total) by mouth 3 (three) times daily as needed for muscle spasms. 30 tablet 1  . omeprazole (PRILOSEC) 20 MG capsule Take 1 capsule (20 mg total) by mouth daily. 90 capsule 1  . Vitamin D, Ergocalciferol, (DRISDOL) 50000 units CAPS capsule Take 1 capsule (50,000 Units total) by mouth 2 (two) times a week. 24 capsule 3   No current facility-administered medications on file prior to visit.      Allergies  Allergen Reactions  . Other Other (See Comments)    IV dye, heart stopped  . Amlodipine Nausea And Vomiting    Dizziness  . Valsartan Rash     Review of Systems:   General:  Denies fever, chills   Optho/Auditory:   Denies  visual changes, blurred vision Respiratory:   Denies SOB, cough, wheeze, DIB  Cardiovascular:   Denies chest pain, palpitations, painful respirations Gastrointestinal:   Denies nausea, vomiting, diarrhea.  Endocrine:     Denies new hot or cold intolerance Musculoskeletal:  Denies joint swelling, gait issues, or new unexplained myalgias/ arthralgias Skin:  Denies rash, suspicious lesions  Neurological:    Denies dizziness, unexplained weakness, numbness  Psychiatric/Behavioral:   Denies mood changes  Objective:    Blood pressure 140/88, pulse 70, height 5' 5" (1.651 m), weight 244 lb 8 oz (110.9 kg).  Body mass index is 40.69 kg/m.  General: Well Developed, well nourished, and in no acute distress.  HEENT: Normocephalic, atraumatic, pupils equal round reactive to light, neck supple, No carotid bruits, no JVD Skin: Warm and dry, cap RF less 2 sec Cardiac: Regular rate and rhythm, S1, S2 WNL's, no murmurs rubs or gallops Respiratory: ECTA B/L, Not using accessory muscles, speaking in full sentences. NeuroM-Sk: Ambulates w/o assistance, moves ext * 4 w/o difficulty, sensation grossly intact.  Ext: scant edema b/l lower ext Psych: No HI/SI, judgement and insight good, Euthymic mood. Full Affect.

## 2017-06-24 DIAGNOSIS — Z96651 Presence of right artificial knee joint: Secondary | ICD-10-CM | POA: Insufficient documentation

## 2017-07-19 DIAGNOSIS — M1711 Unilateral primary osteoarthritis, right knee: Secondary | ICD-10-CM | POA: Diagnosis not present

## 2017-07-19 DIAGNOSIS — M76891 Other specified enthesopathies of right lower limb, excluding foot: Secondary | ICD-10-CM | POA: Diagnosis not present

## 2017-08-01 ENCOUNTER — Ambulatory Visit (INDEPENDENT_AMBULATORY_CARE_PROVIDER_SITE_OTHER): Payer: Medicare Other | Admitting: Family Medicine

## 2017-08-01 ENCOUNTER — Encounter: Payer: Self-pay | Admitting: Family Medicine

## 2017-08-01 ENCOUNTER — Telehealth: Payer: Self-pay | Admitting: Family Medicine

## 2017-08-01 VITALS — BP 146/85 | HR 67 | Ht 65.0 in | Wt 238.0 lb

## 2017-08-01 DIAGNOSIS — I10 Essential (primary) hypertension: Secondary | ICD-10-CM

## 2017-08-01 DIAGNOSIS — R7302 Impaired glucose tolerance (oral): Secondary | ICD-10-CM

## 2017-08-01 DIAGNOSIS — L719 Rosacea, unspecified: Secondary | ICD-10-CM

## 2017-08-01 NOTE — Telephone Encounter (Signed)
You asked me to send this back to you.

## 2017-08-01 NOTE — Telephone Encounter (Signed)
Please advise if you want to fill both medications. MPulliam, CMA/RT(R)

## 2017-08-01 NOTE — Patient Instructions (Addendum)
-  Continue to monitor your blood pressure at home and as long as it stays under 150/90 on a continuous basis, please continue to work on diet and exercise to help control your blood pressure.  You have lost 12 pounds since December, great job and keep it up.  Remember our goal of walking 5-10 minutes once to twice daily.  When the weather turns nice please continue to try to do this.  -Please call us and let us know what the rosacea medicine is that you are using topically given to you by dermatology Dr. Allyson Sabal.  Please follow-up in 3 months we will recheck your vitamin D, A1c and CMP again.

## 2017-08-01 NOTE — Progress Notes (Signed)
Impression and Recommendations:    1. Obesity, Class III, BMI 40-49.9 (morbid obesity) ([place])   2. Glucose intolerance (impaired glucose tolerance) : A1c elevated   3. Essential hypertension   4. Rosacea     1. Obesity Class III-  -pt has lost 12 pounds since December 2018. Continue losing weight and try to get out and exercise. Goal: walk 5-10 minutes/day.   2. Glucose intolerance-  -A1c from 04-09-17 was 5.9. Recommended pt to continue losing weight and watching what you eat. Will monitor closely. Recheck A1c at next chronic OV.  3. Essential HTN-  -BP is slightly elevated recently, but this is likely due to stress from her husband's recent hospitalization. She is compliant with her medications and tolerating them well. Continue as listed below. -Check your BP at home and keep a log. Bring this into next OV.  -Goal BP be consistently below 150/90 -Recommend daily exercise: get out and walk regularly.   4. Rosacea-  -continue using your metronidazole QHS. Call the office and let us know what cream you were prescribed by your dermatologist. Sx  stable at this time and we will continue to monitor.      No orders of the defined types were placed in this encounter.   No orders of the defined types were placed in this encounter.   Gross side effects, risk and benefits, and alternatives of medications and treatment plan in general discussed with patient.  Patient is aware that all medications have potential side effects and we are unable to predict every side effect or drug-drug interaction that may occur.   Patient will call with any questions prior to using medication if they have concerns.  Expresses verbal understanding and consents to current therapy and treatment regimen.  No barriers to understanding were identified.  Red flag symptoms and signs discussed in detail.  Patient expressed understanding regarding what to do in case of emergency\urgent symptoms  Please see AVS  handed out to patient at the end of our visit for further patient instructions/ counseling done pertaining to today's office visit.   Return in about 3 months (around 11/01/2017) for Follow-up A1c, blood pressure, weight-check vitamin D, A1c, CMP.    Note: This note was prepared with assistance of Dragon voice recognition software. Occasional wrong-word or sound-a-like substitutions may have occurred due to the inherent limitations of voice recognition software.  This document serves as a record of services personally performed by Mellody Dance, DO. It was created on her behalf by Mayer Masker, a trained medical scribe. The creation of this record is based on the scribe's personal observations and the provider's statements to them.   I have reviewed the above medical documentation for accuracy and completeness and I concur.  Mellody Dance 08/06/17 5:31 PM   ----------------------------------------------------------------------------------------------------------------------------------------------------------------------------------------------------------  Subjective:     HPI: Kimberly Ferrell is a 77 y.o. female who presents to [place] [facility] today for issues as discussed below including mood, HTN management and other issues.    Mood She has been worried about her husband because of her husband's recent hospitalization.  Skin Pt has rosacea that has worsened since her husband has been in the hospital. She has been under a lot of stress recently. She has been taking metronidazole QHS and another unknown medication from her dermatologist.  Weight: She has lost 12 pounds since December  HTN HPI: She reports her husband has been sick recently.  She has been  able to walk around her house somewhat.   From last OV, we stopped lisinopril because she has not been able to get it filled due to problems with insurance.   -  Her blood pressure has been  controlled at home.  Pt is checking it at home.   Her average BP's 140s/80s. Pulses are average 51-76s. She denies dizziness. She feels good.   - Patient reports good compliance with blood pressure medications.  - Denies medication S-E.   - Smoking Status noted.  - She denies new onset of: chest pain, exercise intolerance, shortness of breath, dizziness, visual changes, headache, lower extremity swelling or claudication.    Last 3 blood pressure readings in our office are as follows: BP Readings from Last 3 Encounters:  08/01/17 (!) 146/85  06/20/17 140/88  05/21/17 (!) 160/93    Filed Weights   08/01/17 1104  Weight: 238 lb (108 kg)    Wt Readings from Last 3 Encounters:  08/01/17 238 lb (108 kg)  06/20/17 244 lb 8 oz (110.9 kg)  05/21/17 247 lb (112 kg)   BP Readings from Last 3 Encounters:  08/01/17 (!) 146/85  06/20/17 140/88  05/21/17 (!) 160/93   Pulse Readings from Last 3 Encounters:  08/01/17 67  06/20/17 70  05/21/17 (!) 56   BMI Readings from Last 3 Encounters:  08/01/17 39.61 kg/m  06/20/17 40.69 kg/m  05/21/17 41.10 kg/m     Patient Care Team    Relationship Specialty Notifications Start End  Mellody Dance, DO PCP - General Family Medicine  01/31/16   Gaynelle Arabian, MD Consulting Physician Orthopedic Surgery  01/31/16   Druscilla Brownie, MD Consulting Physician Dermatology  01/31/16   Anastasio Auerbach, MD Consulting Physician Gynecology  02/06/16   Pieter Partridge, [place] Physician Neurology  11/02/16      Patient Active Problem List   Diagnosis Date Noted  . Gassiness/ belching 06/20/2017  . Muscle spasm of back 05/21/2017  . Obesity, Class III, BMI 40-49.9 (morbid obesity) ([place]) 03/06/2017  . Glucose intolerance (impaired glucose tolerance) : A1c elevated 11/17/2016  . BMI 40.0-44.9, adult ([place]) 11/17/2016  . Vitamin D deficiency 11/17/2016  . GERD (gastroesophageal reflux disease) 11/02/2016  . Pre-operative examination  07/31/2016  . TIA (transient ischemic attack) 01/31/2016  . Vaginal mass 07/23/2014  . OA (osteoarthritis) of knee 05/23/2013  . OAB (overactive bladder) 04/16/2013  . Acute bronchitis 04/16/2013  . Post-nasal drip 05/30/2012  . Finger injury 05/30/2012  . Myalgia and myositis 01/30/2012  . Lung nodule seen on imaging study 01/23/2012  . Flank pain 01/22/2012  . Radicular low back pain 01/22/2012  . Rosacea 06/25/2011  . HTN (hypertension) 06/15/2011  . Back pain 06/15/2011  . Urinary frequency 06/15/2011    Past Medical history, Surgical history, Family history, Social history, Allergies and Medications have been entered into the medical record, reviewed and changed as needed.    Current Meds  Medication Sig  . aspirin EC 81 MG tablet Take 81 mg by mouth daily.  . bisoprolol-hydrochlorothiazide (ZIAC) 5-6.25 MG tablet Take 1 tablet by mouth daily.  Marland Kitchen omeprazole (PRILOSEC) 20 MG capsule Take 1 capsule (20 mg total) by mouth daily.  . Vitamin D, Ergocalciferol, (DRISDOL) 50000 units CAPS capsule Take 1 capsule (50,000 Units total) by mouth 2 (two) times a week.    Allergies:  Allergies  Allergen Reactions  . Other Other (See Comments)    IV dye, heart stopped  . Amlodipine Nausea And Vomiting  Dizziness  . Valsartan Rash     Review of Systems:  A fourteen system review of systems was performed and found to be positive as per HPI.   Objective:   Blood pressure (!) 146/85, pulse 67, height 5\' 5"  (1.651 m), weight 238 lb (108 kg), SpO2 99 %. Body mass index is 39.61 kg/m. General:  Well Developed, well nourished, appropriate for stated age.  Neuro:  Alert and oriented,  extra-ocular muscles intact  HEENT:  Normocephalic, atraumatic, neck supple, no carotid bruits appreciated  Skin:  warm, pink. Telangiectasia on her face and erythematous due exacerbation of her rosacea Cardiac:  RRR, S1 S2 Respiratory:  ECTA B/L and A/P, Not using accessory muscles, speaking in full  sentences- unlabored. Vascular:  Ext warm, no cyanosis apprec.; cap RF less 2 sec. Psych:  No HI/SI, judgement and insight good, Euthymic mood. Full Affect.

## 2017-08-01 NOTE — Telephone Encounter (Signed)
Patient called back with meds that were discussed today, they are metronidazole and clindamycin phosphate.

## 2017-08-02 ENCOUNTER — Other Ambulatory Visit: Payer: Self-pay | Admitting: Family Medicine

## 2017-08-02 DIAGNOSIS — Z1231 Encounter for screening mammogram for malignant neoplasm of breast: Secondary | ICD-10-CM

## 2017-08-02 NOTE — Telephone Encounter (Signed)
I reviewed chart, we have never written for the medication for the patient. MPulliam, CMA/RT(R)

## 2017-08-02 NOTE — Telephone Encounter (Signed)
Per patient she was on these medicines for her rosacea.   During a discussion last office visit she asked if we could refill them instead of having her dermatologist do it.   I told her that was fine.  However these medications were not updated on her med list- patient failed to tell you/us about them when she confirmed and reviewed her medication list was accurate.  She will need her med list updated with these medicines and with the SIG- exactly how she takes them etc. first before I can refill it.    If she does not know, she will have to call the pharmacy and figure out exactly how to use to take it in order for [place] to prescribe it.    Otherwise, I recommend she just call dermatology and get them refilled through them.

## 2017-08-02 NOTE — Telephone Encounter (Signed)
Melissa I apologize the computer crash before I could send you the past note that I wrote at 5:27 PM.  Please see and advise the patient.  Thank you

## 2017-08-03 NOTE — Telephone Encounter (Signed)
Called the pharmacy to get directions and they did not have on file.  Called patient and left a message to call back. MPulliam, CMA/RT(R)

## 2017-08-07 NOTE — Telephone Encounter (Signed)
Spoke to the patient and both are topical and she uses them once daily, she applies to her face at night before bed. MPulliam, CMA/RT(R)

## 2017-08-07 NOTE — Telephone Encounter (Signed)
Called and left message for her to call the office back. MPulliam, CMA/RT(R)

## 2017-08-08 NOTE — Telephone Encounter (Signed)
Called patient and left message to call the office back. MPulliam, CMA/RT(R)  

## 2017-08-08 NOTE — Telephone Encounter (Signed)
I am sorry that we went through all the that information gathering, but she will have to get the medications from her dermatologist since she does not have the exact Sig and we have no evidence of these medications at her pharmacy.  -Please remind patient I recommend she goes to a dermatologist once yearly for skin screenings anyhow so they can screen her for skin cancer every year.

## 2017-08-09 NOTE — Telephone Encounter (Signed)
Called and notified patient. MPulliam, CMA/RT(R)  

## 2017-08-30 ENCOUNTER — Other Ambulatory Visit: Payer: Self-pay

## 2017-09-12 ENCOUNTER — Ambulatory Visit
Admission: RE | Admit: 2017-09-12 | Discharge: 2017-09-12 | Disposition: A | Discharge status: Home / Self Care | Payer: Medicare Other | Source: Ambulatory Visit | Attending: Family Medicine | Admitting: Family Medicine

## 2017-09-12 DIAGNOSIS — Z1231 Encounter for screening mammogram for malignant neoplasm of breast: Secondary | ICD-10-CM

## 2017-10-04 DIAGNOSIS — M76899 Other specified enthesopathies of unspecified lower limb, excluding foot: Secondary | ICD-10-CM | POA: Diagnosis not present

## 2017-10-04 DIAGNOSIS — M76891 Other specified enthesopathies of right lower limb, excluding foot: Secondary | ICD-10-CM | POA: Diagnosis not present

## 2017-10-04 DIAGNOSIS — M170 Bilateral primary osteoarthritis of knee: Secondary | ICD-10-CM | POA: Diagnosis not present

## 2017-11-01 ENCOUNTER — Ambulatory Visit: Payer: Medicare Other | Admitting: Family Medicine

## 2017-12-03 ENCOUNTER — Ambulatory Visit (INDEPENDENT_AMBULATORY_CARE_PROVIDER_SITE_OTHER): Payer: Medicare Other | Admitting: Family Medicine

## 2017-12-03 ENCOUNTER — Encounter: Payer: Self-pay | Admitting: Family Medicine

## 2017-12-03 VITALS — BP 150/79 | HR 68 | Ht 65.0 in | Wt 241.3 lb

## 2017-12-03 DIAGNOSIS — I10 Essential (primary) hypertension: Secondary | ICD-10-CM | POA: Diagnosis not present

## 2017-12-03 DIAGNOSIS — R7302 Impaired glucose tolerance (oral): Secondary | ICD-10-CM | POA: Diagnosis not present

## 2017-12-03 DIAGNOSIS — L719 Rosacea, unspecified: Secondary | ICD-10-CM | POA: Diagnosis not present

## 2017-12-03 DIAGNOSIS — M62830 Muscle spasm of back: Secondary | ICD-10-CM | POA: Diagnosis not present

## 2017-12-03 DIAGNOSIS — E559 Vitamin D deficiency, unspecified: Secondary | ICD-10-CM | POA: Diagnosis not present

## 2017-12-03 DIAGNOSIS — R740 Nonspecific elevation of levels of transaminase and lactic acid dehydrogenase [LDH]: Secondary | ICD-10-CM

## 2017-12-03 DIAGNOSIS — R7401 Elevation of levels of liver transaminase levels: Secondary | ICD-10-CM | POA: Insufficient documentation

## 2017-12-03 MED ORDER — BISOPROLOL-HYDROCHLOROTHIAZIDE 5-6.25 MG PO TABS: 1.0000 | ORAL_TABLET | Freq: Two times a day (BID) | ORAL | 1 refills | Status: DC

## 2017-12-03 MED ORDER — CYCLOBENZAPRINE HCL 10 MG PO TABS: 10.0000 mg | ORAL_TABLET | Freq: Three times a day (TID) | ORAL | 0 refills | Status: DC | PRN

## 2017-12-03 NOTE — Patient Instructions (Addendum)
Please monitor your blood pressure at home since we are doubling your blood pressure medicines.  Check it every couple days and write down the value of your blood pressure as well as pulse.  Please follow-up sooner than planned if remains above goal of 140/90 or less on a regular basis    Risk factors for prediabetes and type 2 diabetes  Researchers don't fully understand why some people develop prediabetes and type 2 diabetes and others don't.  It's clear that certain factors increase the risk, however, including:  Weight. The more fatty tissue you have, the more resistant your cells become to insulin.  Inactivity. The less active you are, the greater your risk. Physical activity helps you control your weight, uses up glucose as energy and makes your cells more sensitive to insulin.  Family history. Your risk increases if a parent or sibling has type 2 diabetes.  Race. Although it's unclear why, people of certain races -- including blacks, Hispanics, American Indians and Asian-Americans -- are at higher risk.  Age. Your risk increases as you get older. This may be because you tend to exercise less, lose muscle mass and gain weight as you age. But type 2 diabetes is also increasing dramatically among children, adolescents and younger adults.  Gestational diabetes. If you developed gestational diabetes when you were pregnant, your risk of developing prediabetes and type 2 diabetes later increases. If you gave birth to a baby weighing more than 9 pounds (4 kilograms), you're also at risk of type 2 diabetes.  Polycystic ovary syndrome. For women, having polycystic ovary syndrome -- a common condition characterized by irregular menstrual periods, excess hair growth and obesity -- increases the risk of diabetes.  High blood pressure. Having blood pressure over 140/90 millimeters of mercury (mm Hg) is linked to an increased risk of type 2 diabetes.  Abnormal cholesterol and triglyceride levels. If you have  low levels of high-density lipoprotein (HDL), or "good," cholesterol, your risk of type 2 diabetes is higher. Triglycerides are another type of fat carried in the blood. People with high levels of triglycerides have an increased risk of type 2 diabetes. Your doctor can let you know what your cholesterol and triglyceride levels are.  A good guide to good carbs: The glycemic index ---If you have diabetes, or at risk for diabetes, you know all too well that when you eat carbohydrates, your blood sugar goes up. The total amount of carbs you consume at a meal or in a snack mostly determines what your blood sugar will do. But the food itself also plays a role. A serving of white rice has almost the same effect as eating pure table sugar -- a quick, high spike in blood sugar. A serving of lentils has a slower, smaller effect.  ---Picking good sources of carbs can help you control your blood sugar and your weight. Even if you don't have diabetes, eating healthier carbohydrate-rich foods can help ward off a host of chronic conditions, from heart disease to various cancers to, well, diabetes.  ---One way to choose foods is with the glycemic index (GI). This tool measures how much a food boosts blood sugar.  The glycemic index rates the effect of a specific amount of a food on blood sugar compared with the same amount of pure glucose. A food with a glycemic index of 28 boosts blood sugar only 28% as much as pure glucose. One with a GI of 95 acts like pure glucose.    High glycemic  foods result in a quick spike in insulin and blood sugar (also known as blood glucose).  Low glycemic foods have a slower, smaller effect- these are healthier for you.   Using the glycemic index Using the glycemic index is easy: choose foods in the low GI category instead of those in the high GI category (see below), and go easy on those in between. Low glycemic index (GI of 55 or less): Most fruits and vegetables, beans, minimally  processed grains, pasta, low-fat dairy foods, and nuts.  Moderate glycemic index (GI 56 to 69): White and sweet potatoes, corn, white rice, couscous, breakfast cereals such as Cream of Wheat and Mini Wheats.  High glycemic index (GI of 70 or higher): White bread, rice cakes, most crackers, bagels, cakes, doughnuts, croissants, most packaged breakfast cereals. You can see the values for 100 commons foods and get links to more at www.health.CheapToothpicks.si.  Swaps for lowering glycemic index  Instead of this high-glycemic index food Eat this lower-glycemic index food  White rice Brown rice or converted rice  Instant oatmeal Steel-cut oats  Cornflakes Bran flakes  Baked potato Pasta, bulgur  White bread Whole-grain bread  Corn Peas or leafy greens       Prediabetes Eating Plan  Prediabetes--also called impaired glucose tolerance or impaired fasting glucose--is a condition that causes blood sugar (blood glucose) levels to be higher than normal. Following a healthy diet can help to keep prediabetes under control. It can also help to lower the risk of type 2 diabetes and heart disease, which are increased in people who have prediabetes. Along with regular exercise, a healthy diet:  Promotes weight loss.  Helps to control blood sugar levels.  Helps to improve the way that the body uses insulin.   WHAT DO I NEED TO KNOW ABOUT THIS EATING PLAN?   Use the glycemic index (GI) to plan your meals. The index tells you how quickly a food will raise your blood sugar. Choose low-GI foods. These foods take a longer time to raise blood sugar.  Pay close attention to the amount of carbohydrates in the food that you eat. Carbohydrates increase blood sugar levels.  Keep track of how many calories you take in. Eating the right amount of calories will help you to achieve a healthy weight. Losing about 7 percent of your starting weight can help to prevent type 2 diabetes.  You may want to follow a  Mediterranean diet. This diet includes a lot of vegetables, lean meats or fish, whole grains, fruits, and healthy oils and fats.   WHAT FOODS CAN I EAT?  Grains Whole grains, such as whole-wheat or whole-grain breads, crackers, cereals, and pasta. Unsweetened oatmeal. Bulgur. Barley. Quinoa. Brown rice. Corn or whole-wheat flour tortillas or taco shells. Vegetables Lettuce. Spinach. Peas. Beets. Cauliflower. Cabbage. Broccoli. Carrots. Tomatoes. Squash. Eggplant. Herbs. Peppers. Onions. Cucumbers. Brussels sprouts. Fruits Berries. Bananas. Apples. Oranges. Grapes. Papaya. Mango. Pomegranate. Kiwi. Grapefruit. Cherries. Meats and Other Protein Sources Seafood. Lean meats, such as chicken and [place] or lean cuts of pork and beef. Tofu. Eggs. Nuts. Beans. Dairy Low-fat or fat-free dairy products, such as yogurt, cottage cheese, and cheese. Beverages Water. Tea. Coffee. Sugar-free or diet soda. Seltzer water. Milk. Milk alternatives, such as soy or almond milk. Condiments Mustard. Relish. Low-fat, low-sugar ketchup. Low-fat, low-sugar barbecue sauce. Low-fat or fat-free mayonnaise. Sweets and Desserts Sugar-free or low-fat pudding. Sugar-free or low-fat ice cream and other frozen treats. Fats and Oils Avocado. Walnuts. Olive oil. The items listed  above may not be a complete list of recommended foods or beverages. Contact your dietitian for more options.    WHAT FOODS ARE NOT RECOMMENDED?  Grains Refined white flour and flour products, such as bread, pasta, snack foods, and cereals. Beverages Sweetened drinks, such as sweet iced tea and soda. Sweets and Desserts Baked goods, such as cake, cupcakes, pastries, cookies, and cheesecake. The items listed above may not be a complete list of foods and beverages to avoid. Contact your dietitian for more information.   This information is not intended to replace advice given to you by your health care provider. Make sure you discuss any  questions you have with your health care provider.   Document Released: 09/15/2014 Document Reviewed: 09/15/2014 Elsevier Interactive Patient Education [employer].

## 2017-12-03 NOTE — Progress Notes (Signed)
Impression and Recommendations:    1. Essential hypertension   2. Glucose intolerance (impaired glucose tolerance) : A1c elevated   3. Muscle spasm of back   4. Obesity, Class III, BMI 40-49.9 (morbid obesity) ([place])   5. Rosacea   6. Vitamin D deficiency   7. Elevated ALT measurement     1. Hypertension - BP remains elevated above goal at this time. - Patient will begin taking Ziac BID.   See med list below. - Reviewed goal blood pressure with patient today, 140's/90's or less.  - Patient tolerating meds well without complication.  Denies S-E  - Lifestyle changes such as dash diet and engaging in a regular exercise program discussed with patient.  Educational handouts provided.  - Ambulatory BP monitoring encouraged. Keep log and bring in next OV.  - Patient knows to follow up sooner than planned if BP remains elevated (above goal of 140/90 or less) on a daily basis  2. Glucose Intolerance/Prediabetes - Reviewed goal A1c as less than 5.7.  - Counseled patient on prevention of diabetes and discussed dietary and lifestyle modifications as first line.  Importance of low carb/ketogenic diet discussed with patient in addition to regular exercise.   - Handout provided today.  3. Vitamin D Deficiency - Continue supplementation as prescribed. - No changes made to treatment plan today.  See med list below.  - Lab work re-checked today.  4. Chronic Knee/Leg Pain - Advised patient to apply ice to sites of pain/inflammation for 15-20 minutes at a time, five or more times per day.   Educated patient on treating inflammation in areas of joint/bone with ice.  - Encouraged patient to apply a foam pillow or other cushion beneath her knee to help elevate the area.  - Continue on tramadol PRN per Ortho.  - if patient desires additional assistance with pain or pain related concerns (such as difficulty sleeping), she knows she may return for further follow-up.  Muscle Spasm in  Back - Reviewed that heat may be applied for muscle spasm. - Prescription written for muscle relaxer today.  See med list below.  5. Obesity - BMI Counseling Explained to patient what BMI refers to, and what it means medically.    Told patient to think about it as a "medical risk stratification measurement" and how increasing BMI is associated with increasing risk/ or worsening state of various diseases such as hypertension, hyperlipidemia, diabetes, premature OA, depression etc.  American Heart Association guidelines for healthy diet, basically Mediterranean diet, and exercise guidelines of 30 minutes 5 days per week or more discussed in detail.  Health counseling performed.  All questions answered.  6. Lifestyle & Preventative Health Maintenance - Advised patient to continue working toward exercising to improve overall mental, physical, and emotional health.    - Encouraged patient to engage in daily physical activity.  Recommended that the patient eventually strive for at least 150 minutes of moderate cardiovascular activity per week according to guidelines established by the [facility].   - Healthy dietary habits encouraged, including low-carb, and high amounts of lean protein in diet.   - Patient should also consume adequate amounts of water - half of body weight in oz of water per day.   Education and routine counseling performed. Handouts provided.  7. Follow-Up - Patient is due for lab work today.  She is not fasting today.  - Continue to return to the clinic for regularly scheduled chronic follow-up.   Orders Placed This Encounter  Procedures  . Hemoglobin A1c  . Comprehensive metabolic panel  . VITAMIN D 25 Hydroxy (Vit-D Deficiency, Fractures)   Medications Discontinued During This Encounter  Medication Reason  . bisoprolol-hydrochlorothiazide (ZIAC) 5-6.25 MG tablet     Meds ordered this encounter  Medications  . bisoprolol-hydrochlorothiazide (ZIAC) 5-6.25 MG tablet     Sig: Take 1 tablet by mouth 2 (two) times daily.    Dispense:  360 tablet    Refill:  1  . cyclobenzaprine (FLEXERIL) 10 MG tablet    Sig: Take 1 tablet (10 mg total) by mouth 3 (three) times daily as needed for muscle spasms.    Dispense:  30 tablet    Refill:  0    The patient was counseled, risk factors were discussed, anticipatory guidance given.  Gross side effects, risk and benefits, and alternatives of medications discussed with patient.  Patient is aware that all medications have potential side effects and we are unable to predict every side effect or drug-drug interaction that may occur.  Expresses verbal understanding and consents to current therapy plan and treatment regimen.  Return in about 4 months (around 04/05/2018) for Hypertension follow up every 4 mo.  Please see AVS handed out to patient at the end of our visit for further patient instructions/ counseling done pertaining to today's office visit.    Note: This document was prepared using Dragon voice recognition software and may include unintentional dictation errors.  This document serves as a record of services personally performed by Mellody Dance, DO. It was created on her behalf by [name], a trained medical scribe. The creation of this record is based on the scribe's personal observations and the provider's statements to them.   I have reviewed the above medical documentation for accuracy and completeness and I concur.  Mellody Dance 12/09/17 9:43 PM    Subjective:    HPI: Kimberly Ferrell is a 77 y.o. female who presents to [place] [facility] today for follow up for HTN.    Chronic Leg/Knee Pain Says she's been good aside from her legs.  States that since her surgery, her legs have been "giving her a fit."  She called Dr. Anne Fu office and is working on getting a plan in place.  She was given shots and patches, "which helped a little bit but not a whole lot."  She  was additionally prescribed tramadol to help with her pain.  Difficulty Sleeping (Secondary to Chronic Pain) Notes she has had issues sleeping since her knee pain.  "I just can't fall asleep."  States that when she does fall asleep, she will often wake up because she feels the pain in her knee again.  Notes it hurts worse at night than it does in the day time.  General Health "Other than that, I feel fine and doing great."  Patient reports she is taking her vitamin D supplementation as prescribed.  She continues using metronidazole PRN for her rosacea.  HTN:  -  Her blood pressure has not been controlled at home.  Patient states it runs151/72 on average.  She has not noticed any trends where her blood pressure has been particularly high or particularly low.  Notes she typically checks her blood pressure in the mornings.  - Patient reports good compliance with blood pressure medications.  - Denies medication S-E   - Smoking Status noted   - She denies new onset of: chest pain, exercise intolerance, shortness of breath, dizziness, visual changes,  headache, lower extremity swelling or claudication.   Denies constipation, diarrhea, GI symptoms, new skin rash or redness (aside from chronic rosacea).  Last 3 blood pressure readings in our office are as follows: BP Readings from Last 3 Encounters:  12/03/17 (!) 150/79  08/01/17 (!) 146/85  06/20/17 140/88    Pulse Readings from Last 3 Encounters:  12/03/17 68  08/01/17 67  06/20/17 70    Filed Weights   12/03/17 1316  Weight: 241 lb 4.8 oz (109.5 kg)     Patient Care Team    Relationship Specialty Notifications Start End  Mellody Dance, DO PCP - General Family Medicine  01/31/16   Gaynelle Arabian, MD Consulting Physician Orthopedic Surgery  01/31/16   Druscilla Brownie, MD Consulting Physician Dermatology  01/31/16   Anastasio Auerbach, MD Consulting Physician Gynecology  02/06/16   Pieter Partridge, DO Consulting Physician  Neurology  11/02/16      Lab Results  Component Value Date   CREATININE 0.63 12/03/2017   BUN 8 12/03/2017   NA 143 12/03/2017   K 4.5 12/03/2017   CL 103 12/03/2017   CO2 22 12/03/2017    Lab Results  Component Value Date   CHOL 122 11/10/2016   CHOL 136 04/16/2013    Lab Results  Component Value Date   HDL 40 11/10/2016   HDL 33.30 (L) 04/16/2013    Lab Results  Component Value Date   LDLCALC 62 11/10/2016   [place] 80 04/16/2013    Lab Results  Component Value Date   TRIG 98 11/10/2016   TRIG 116.0 04/16/2013    Lab Results  Component Value Date   CHOLHDL 3.1 11/10/2016   CHOLHDL 4 04/16/2013    No results found for: LDLDIRECT ===================================================================   Patient Active Problem List   Diagnosis Date Noted  . BMI 40.0-44.9, adult ([place]) 11/17/2016    Priority: High  . HTN (hypertension) 06/15/2011    Priority: High  . Glucose intolerance (impaired glucose tolerance) : A1c elevated 11/17/2016    Priority: Medium  . TIA (transient ischemic attack) 01/31/2016    Priority: Medium  . Vitamin D deficiency 11/17/2016    Priority: Low  . GERD (gastroesophageal reflux disease) 11/02/2016    Priority: Low  . OA (osteoarthritis) of knee 05/23/2013    Priority: Low  . Myalgia and myositis 01/30/2012    Priority: Low  . Elevated ALT measurement 12/03/2017  . Gassiness/ belching 06/20/2017  . Muscle spasm of back 05/21/2017  . Obesity, Class III, BMI 40-49.9 (morbid obesity) ([place]) 03/06/2017  . Pre-operative examination 07/31/2016  . Vaginal mass 07/23/2014  . OAB (overactive bladder) 04/16/2013  . Acute bronchitis 04/16/2013  . Post-nasal drip 05/30/2012  . Finger injury 05/30/2012  . Lung nodule seen on imaging study 01/23/2012  . Flank pain 01/22/2012  . Radicular low back pain 01/22/2012  . Rosacea 06/25/2011  . Back pain 06/15/2011  . Urinary frequency 06/15/2011     Past Medical History:  Diagnosis  Date  . Arthritis   . Cancer ([place])    skin cancer removed from right forehead  . Chicken pox as child  . Colon polyps   . Complication of anesthesia    "hard to wake up"  . GERD (gastroesophageal reflux disease)    occasional  . Hemorrhoids    "sometimes"  . Hypertension   . Lung nodule seen on imaging study    noted 8/13- needs CT f/u in 1 year  . Mononucleosis   .  Pneumonia    hx of  . PONV (postoperative nausea and vomiting)   . Rosacea   . TIA (transient ischemic attack) 2007   from IV dye, no residual, passed out and was told by RN that it was TIA     Past Surgical History:  Procedure Laterality Date  . ABDOMINAL HYSTERECTOMY  1993  . BREAST BIOPSY Left 2004   [place]   . CHOLECYSTECTOMY  1992  . HEMORRHOID SURGERY  1960's  . PARTIAL KNEE ARTHROPLASTY Right 08/09/2016   Procedure: RIGHT UNICOMPARTMENTAL KNEE;  Surgeon: Gaynelle Arabian, MD;  Location: [facility];  Service: Orthopedics;  Laterality: Right;  requests 1hr; Adductor Block  . TOTAL KNEE ARTHROPLASTY Left 05/23/2013   Procedure: LEFT TOTAL KNEE ARTHROPLASTY;  Surgeon: Gearlean Alf, MD;  Location: [facility];  Service: Orthopedics;  Laterality: Left;     Family History  Problem Relation Age of Onset  . Cancer Mother 88       colon  . Sudden death Father 46       suicide  . Stroke Maternal Grandmother 78  . Hypertension Neg Hx   . Hyperlipidemia Neg Hx   . Heart attack Neg Hx   . Diabetes Neg Hx   . Breast cancer Neg Hx      Social History   Substance and Sexual Activity  Drug Use No  ,  Social History   Substance and Sexual Activity  Alcohol Use No  ,  Social History   Tobacco Use  Smoking Status Never Smoker  Smokeless Tobacco Never Used  ,    Current Outpatient Medications on File Prior to Visit  Medication Sig Dispense Refill  . aspirin EC 81 MG tablet Take 81 mg by mouth daily.    . metroNIDAZOLE (METROCREAM) 0.75 % cream Apply 1 application topically 2 (two) times daily.    Marland Kitchen  omeprazole (PRILOSEC) 20 MG capsule Take 1 capsule (20 mg total) by mouth daily. 90 capsule 1  . Vitamin D, Ergocalciferol, (DRISDOL) 50000 units CAPS capsule Take 1 capsule (50,000 Units total) by mouth 2 (two) times a week. 24 capsule 3   No current facility-administered medications on file prior to visit.      Allergies  Allergen Reactions  . Other Other (See Comments)    IV dye, heart stopped  . Amlodipine Nausea And Vomiting    Dizziness  . Valsartan Rash     Review of Systems:   General:  Denies fever, chills Optho/Auditory:   Denies visual changes, blurred vision Respiratory:   Denies SOB, cough, wheeze, DIB  Cardiovascular:   Denies chest pain, palpitations, painful respirations Gastrointestinal:   Denies nausea, vomiting, diarrhea.  Endocrine:     Denies new hot or cold intolerance Musculoskeletal:   + chronic leg & knee pain.  Denies joint swelling, gait issues, or new unexplained myalgias/ arthralgias Skin:  Denies unusual rash, suspicious lesions  Neurological:    Denies dizziness, unexplained weakness, numbness  Psychiatric/Behavioral:   Denies mood changes  Objective:    Blood pressure (!) 150/79, pulse 68, height 5\' 5"  (1.651 m), weight 241 lb 4.8 oz (109.5 kg), SpO2 95 %.  Body mass index is 40.15 kg/m.  General: Well Developed, well nourished, and in no acute distress.  HEENT: Normocephalic, atraumatic, pupils equal round reactive to light, neck supple, No carotid bruits, no JVD Skin: Warm and dry, cap RF less 2 sec Cardiac: Regular rate and rhythm, S1, S2 WNL's, no murmurs rubs or gallops Respiratory:  ECTA B/L, Not using accessory muscles, speaking in full sentences. NeuroM-Sk: Muscle spasms and tightness of paraspinal muscles T-11-12 down to L3. Ambulates w/o assistance, moves ext * 4 w/o difficulty, sensation grossly intact.  Ext: scant edema b/l lower ext Psych: No HI/SI, judgement and insight good, Euthymic mood. Full Affect.

## 2017-12-04 LAB — COMPREHENSIVE METABOLIC PANEL
ALT: 31 IU/L (ref 0–32)
AST: 35 IU/L (ref 0–40)
Albumin/Globulin Ratio: 1.4 (ref 1.2–2.2)
Albumin: 4.1 g/dL (ref 3.5–4.8)
Alkaline Phosphatase: 87 IU/L (ref 39–117)
BILIRUBIN TOTAL: 0.6 mg/dL (ref 0.0–1.2)
BUN / CREAT RATIO: 13 (ref 12–28)
BUN: 8 mg/dL (ref 8–27)
CHLORIDE: 103 mmol/L (ref 96–106)
CO2: 22 mmol/L (ref 20–29)
Calcium: 9.5 mg/dL (ref 8.7–10.3)
Creatinine, Ser: 0.63 mg/dL (ref 0.57–1.00)
GFR, EST AFRICAN AMERICAN: 101 mL/min/{1.73_m2} (ref >59)
GFR, EST NON AFRICAN AMERICAN: 87 mL/min/{1.73_m2} (ref >59)
GLUCOSE: 105 mg/dL — AB (ref 65–99)
Globulin, Total: 3 g/dL (ref 1.5–4.5)
Potassium: 4.5 mmol/L (ref 3.5–5.2)
Sodium: 143 mmol/L (ref 134–144)
TOTAL PROTEIN: 7.1 g/dL (ref 6.0–8.5)

## 2017-12-04 LAB — HEMOGLOBIN A1C
Est. average glucose Bld gHb Est-mCnc: 120 mg/dL
Hgb A1c MFr Bld: 5.8 % — ABNORMAL HIGH (ref 4.8–5.6)

## 2017-12-04 LAB — VITAMIN D 25 HYDROXY (VIT D DEFICIENCY, FRACTURES): Vit D, 25-Hydroxy: 27.1 ng/mL — ABNORMAL LOW (ref 30.0–100.0)

## 2017-12-06 ENCOUNTER — Other Ambulatory Visit: Payer: Self-pay

## 2017-12-06 DIAGNOSIS — E559 Vitamin D deficiency, unspecified: Secondary | ICD-10-CM

## 2017-12-06 DIAGNOSIS — Z13820 Encounter for screening for osteoporosis: Secondary | ICD-10-CM

## 2017-12-19 ENCOUNTER — Other Ambulatory Visit: Payer: Self-pay | Admitting: Family Medicine

## 2017-12-19 DIAGNOSIS — E559 Vitamin D deficiency, unspecified: Secondary | ICD-10-CM

## 2017-12-19 DIAGNOSIS — E2839 Other primary ovarian failure: Secondary | ICD-10-CM

## 2017-12-28 DIAGNOSIS — Z96652 Presence of left artificial knee joint: Secondary | ICD-10-CM | POA: Insufficient documentation

## 2017-12-28 DIAGNOSIS — Z96651 Presence of right artificial knee joint: Secondary | ICD-10-CM | POA: Diagnosis not present

## 2017-12-28 DIAGNOSIS — M76891 Other specified enthesopathies of right lower limb, excluding foot: Secondary | ICD-10-CM | POA: Diagnosis not present

## 2018-01-28 DIAGNOSIS — R55 Syncope and collapse: Secondary | ICD-10-CM | POA: Diagnosis not present

## 2018-01-28 DIAGNOSIS — R0600 Dyspnea, unspecified: Secondary | ICD-10-CM | POA: Diagnosis not present

## 2018-01-28 DIAGNOSIS — Z5321 Procedure and treatment not carried out due to patient leaving prior to being seen by health care provider: Secondary | ICD-10-CM | POA: Diagnosis not present

## 2018-01-30 DIAGNOSIS — M1711 Unilateral primary osteoarthritis, right knee: Secondary | ICD-10-CM | POA: Diagnosis not present

## 2018-01-30 DIAGNOSIS — M25562 Pain in left knee: Secondary | ICD-10-CM | POA: Diagnosis not present

## 2018-01-30 DIAGNOSIS — M5416 Radiculopathy, lumbar region: Secondary | ICD-10-CM | POA: Diagnosis not present

## 2018-01-31 ENCOUNTER — Encounter: Payer: Self-pay | Admitting: Family Medicine

## 2018-01-31 ENCOUNTER — Ambulatory Visit (INDEPENDENT_AMBULATORY_CARE_PROVIDER_SITE_OTHER): Payer: Medicare Other | Admitting: Family Medicine

## 2018-01-31 VITALS — BP 122/90 | HR 60 | Ht 62.0 in | Wt 235.8 lb

## 2018-01-31 DIAGNOSIS — R55 Syncope and collapse: Secondary | ICD-10-CM | POA: Insufficient documentation

## 2018-01-31 DIAGNOSIS — I739 Peripheral vascular disease, unspecified: Secondary | ICD-10-CM | POA: Insufficient documentation

## 2018-01-31 MED ORDER — CHLORTHALIDONE 25 MG PO TABS: 12.5000 mg | ORAL_TABLET | Freq: Every day | ORAL | 0 refills | Status: DC

## 2018-01-31 MED ORDER — LISINOPRIL 10 MG PO TABS: ORAL_TABLET | ORAL | 1 refills | Status: DC

## 2018-01-31 NOTE — Patient Instructions (Addendum)
-  Patient will follow up with her neurologist Dr. Tomi Likens who she still a patient of until March 17, 2018.    Please call him for an appointment-Patient declines an MRI today of her brain and she will discuss this with the neurologist-since he may know some other imaging that might be appropriate.   -Patient does agree to go for a exercise stress echo it will be a chemical stress as well as ultrasounds of her carotids, and also since patient describes symptoms of claudication we will also get ABIs/ vascular studies of bilateral lower extremities  We will follow-up in 4 weeks please bring me a blood pressure log.  Please check your blood pressures at least twice daily including your heart rate

## 2018-01-31 NOTE — Progress Notes (Signed)
Impression and Recommendations:    1. Syncope, unspecified syncope type-  acute on chronic   2. 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HCC)     Syncope: 1. Discussed with patient that being dehydrated as well as being on a beta-blocker at her age can be contributing to her syncopal events.  We will slowly wean her off of the Ziac and add lisinopril and chlorthalidone.  She will add these on slowly and slowly we will wean up on the dose. 2. Did discuss with patient this is not likely to be a TIA since she was completely essentially on symptomatic after this event. 3. Patient will check her blood pressure at home twice daily and bring in a log of blood pressures as well as pulse next OV 4. We are sending her for chemical stress test, carotid ultrasounds, these of both be for evaluation of her recent syncope. 5. Additionally she will follow-up with Dr. Tomi Likens her neurologist who she is seen in the past for the same symptom that has not seen since November 2017.  Patient knows to call for this appointment. 6. Red flag symptoms discussed and if she develops any she will proceed to the emergency room   Claudication: 1. Patient has symptoms claudication we will send her for bilateral lower extremity vascular studies. 2. Patient understands if there are abnormalities she most likely will need to see a cardiovascular doctor in the future.  Pt was in the office today for 32.5+ minutes, with over 50% time spent in face to face counseling of patients various medical conditions, treatment plans of those medical conditions including medicine management and lifestyle modification, strategies to improve health and well being; and in coordination of care.  I discussed with patient and her husband the notes from [place] and what her blood pressure and studies showed.  Also discussed with the wide differential diagnosis but this was unlikely to be TIA.  She will continue her aspirin however.  They  had many questions about also her lower extremities and what neurology has to say last time.  All of these were reviewed reviewed with patient today.  SEE ABOVE TREATMENT PLAN FOR DETAILS    Meds ordered this encounter  Medications  . chlorthalidone (HYGROTON) 25 MG tablet    Sig: Take 0.5 tablets (12.5 mg total) by mouth daily.    Dispense:  45 tablet    Refill:  0  . lisinopril (PRINIVIL,ZESTRIL) 10 MG tablet    Sig: Start with 1/2 tablet daily for 1 week, then 1 full tablet daily.    Dispense:  30 tablet    Refill:  1    Medications Discontinued During This Encounter  Medication Reason  . bisoprolol-hydrochlorothiazide (ZIAC) 5-6.25 MG tablet Change in therapy     -We will order carotid bilateral ultrasounds, echocardiogram-stress and also bilateral lower extremity ABIs are ordered from a prior doctor.   Gross side effects, risk and benefits, and alternatives of medications and treatment plan in general discussed with patient.  Patient is aware that all medications have potential side effects and we are unable to predict every side effect or drug-drug interaction that may occur.   Patient will call with any questions prior to using medication if they have concerns.  Expresses verbal understanding and consents to current therapy and treatment regimen.  No barriers to understanding were identified.  Red flag symptoms and signs discussed in detail.  Patient expressed understanding regarding what to do in case of  emergency\urgent symptoms  Please see AVS handed out to patient at the end of our visit for further patient instructions/ counseling done pertaining to today's office visit.   Return in about 4 weeks (around 02/28/2018) for Blood pressure med change, stress echo, carotid ultrasound, bilateral lower extremity ABI.    Note:  This note was prepared with assistance of Dragon voice recognition software. Occasional wrong-word or sound-a-like substitutions may have occurred due to the  inherent limitations of voice recognition software.    --------------------------------------------------------------------------------------------------------------------------------------------------------------------------------------------------------------------------------------------    Subjective:     HPI: Kimberly Ferrell is a 77 y.o. female who presents to [place] at [facility] today for issues as discussed below.   She is here to follow-up follow-up office visit from when she was seen at [facility] ED on 02-20-2018.  She had passed out at home at the [employer] in [place] when she had just had a syncopal event.  She had a complete loss of consciousness and does not recall it.  All prior to passing out patient felt very warm and felt she saw little stars in her eyes and that is the last thing she recalled.  Wall she passed out while she was sitting down so she just slumped further into her chair.  She did not fall or hit her head etc.   Patient does not recall.  A time after her passing out as well and her husband then proceeded to drive her to [facility] ED. there they had difficult times with sticking her and getting blood work.  She has multiple bruises on her bilateral arms.  They are unable to get blood or do any further testing and since patient was not comfortable with the care she was receiving she left AMA.  Pt has had these same events/ several syncopal events with the same exact sx.  Last MRI w/s was in 2008- which showed MRI of brain with and without contrast was performed on 06/28/06, which was personally reviewed and revealed chronic small vessel ischemic changes but no acute stroke, bleed or mass lesion.   Pt seen by Neuro - Dr Tomi Likens on 03/17/16 for these sx and no further w/up recommended      Wt Readings from Last 3 Encounters:  01/31/18 235 lb 13 oz (107 kg)  12/03/17 241 lb 4.8 oz (109.5 kg)  08/01/17 238 lb (108  kg)   BP Readings from Last 3 Encounters:  01/31/18 122/90  12/03/17 (!) 150/79  08/01/17 (!) 146/85   Pulse Readings from Last 3 Encounters:  01/31/18 60  12/03/17 68  08/01/17 67   BMI Readings from Last 3 Encounters:  01/31/18 43.13 kg/m  12/03/17 40.15 kg/m  08/01/17 39.61 kg/m     Patient Care Team    Relationship Specialty Notifications Start End  Mellody Dance, DO PCP - General Family Medicine  01/31/16   Gaynelle Arabian, MD Consulting Physician Orthopedic Surgery  01/31/16   Druscilla Brownie, MD Consulting Physician Dermatology  01/31/16   Anastasio Auerbach, MD Consulting Physician Gynecology  02/06/16   Pieter Partridge, [place] Physician Neurology  11/02/16      Patient Active Problem List   Diagnosis Date Noted  . BMI 40.0-44.9, adult ([place]) 11/17/2016    Priority: High  . HTN (hypertension) 06/15/2011    Priority: High  . Glucose intolerance (impaired glucose tolerance) : A1c elevated 11/17/2016    Priority: Medium  . TIA (transient ischemic attack) 01/31/2016  Priority: Medium  . Vitamin D deficiency 11/17/2016    Priority: Low  . GERD (gastroesophageal reflux disease) 11/02/2016    Priority: Low  . OA (osteoarthritis) of knee 05/23/2013    Priority: Low  . Myalgia and myositis 01/30/2012    Priority: Low  . 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[place]) 01/31/2018  . Syncope 01/31/2018  . 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 Elevated ALT measurement 12/03/2017  . Gassiness/ belching 06/20/2017  . Muscle spasm of back 05/21/2017  . Obesity, Class III, BMI 40-49.9 (morbid obesity) ([place]) 03/06/2017  . Pre-operative examination 07/31/2016  . Vaginal mass 07/23/2014  . OAB (overactive bladder) 04/16/2013  . Acute bronchitis 04/16/2013  . Post-nasal drip 05/30/2012  . Finger injury 05/30/2012  . Lung nodule seen on imaging study 01/23/2012  . Flank pain 01/22/2012  . Radicular low back pain 01/22/2012  . Rosacea 06/25/2011    . Back pain 06/15/2011  . Urinary frequency 06/15/2011    Past Medical history, Surgical history, Family history, Social history, Allergies and Medications have been entered into the medical record, reviewed and changed as needed.    Current Meds  Medication Sig  . aspirin EC 81 MG tablet Take 81 mg by mouth daily.  . cyclobenzaprine (FLEXERIL) 10 MG tablet Take 1 tablet (10 mg total) by mouth 3 (three) times daily as needed for muscle spasms.  . metroNIDAZOLE (METROCREAM) 0.75 % cream Apply 1 application topically 2 (two) times daily.  Marland Kitchen omeprazole (PRILOSEC) 20 MG capsule Take 1 capsule (20 mg total) by mouth daily.  . Vitamin D, Ergocalciferol, (DRISDOL) 50000 units CAPS capsule Take 1 capsule (50,000 Units total) by mouth 2 (two) times a week.  . [DISCONTINUED] bisoprolol-hydrochlorothiazide (ZIAC) 5-6.25 MG tablet Take 1 tablet by mouth 2 (two) times daily.    Allergies:  Allergies  Allergen Reactions  . Other Other (See Comments)    IV dye, heart stopped  . Amlodipine Nausea And Vomiting    Dizziness  . Valsartan Rash     Review of Systems:  A fourteen system review of systems was performed and found to be positive as per HPI.   Objective:   Blood pressure 122/90, pulse 60, height 5\' 2"  (1.575 m), weight 235 lb 13 oz (107 kg), SpO2 98 %. Body mass index is 43.13 kg/m. General:  Well Developed, well nourished, appropriate for stated age.  Neuro:  Alert and oriented,  extra-ocular muscles intact  HEENT:  Normocephalic, atraumatic, neck supple, no carotid bruits appreciated  Skin:  no gross rash, warm, pink. Cardiac:  RRR, S1 S2 Respiratory:  ECTA B/L and A/P, Not using accessory muscles, speaking in full sentences- unlabored. Vascular:  Ext warm, no cyanosis apprec.; cap RF less 2 sec. Psych:  No HI/SI, judgement and insight good, Euthymic mood. Full Affect.

## 2018-02-04 ENCOUNTER — Telehealth (HOSPITAL_COMMUNITY): Payer: Self-pay | Admitting: *Deleted

## 2018-02-04 NOTE — Telephone Encounter (Signed)
Patient given detailed instructions per Myocardial Perfusion Study Information Sheet for the test on 02/06/18 at 230. Patient notified to arrive 15 minutes early and that it is imperative to arrive on time for appointment to keep from having the test rescheduled.  If you need to cancel or reschedule your appointment, please call the office within 24 hours of your appointment. . Patient verbalized understanding.Melody Savidge, Ranae Palms

## 2018-02-06 ENCOUNTER — Other Ambulatory Visit (HOSPITAL_COMMUNITY): Payer: Medicare Other

## 2018-02-13 ENCOUNTER — Encounter (HOSPITAL_COMMUNITY): Payer: Medicare Other

## 2018-02-15 ENCOUNTER — Ambulatory Visit
Admission: RE | Admit: 2018-02-15 | Discharge: 2018-02-15 | Disposition: A | Discharge status: Home / Self Care | Payer: Medicare Other | Source: Ambulatory Visit | Attending: Family Medicine | Admitting: Family Medicine

## 2018-02-15 DIAGNOSIS — M8589 Other specified disorders of bone density and structure, multiple sites: Secondary | ICD-10-CM | POA: Diagnosis not present

## 2018-02-15 DIAGNOSIS — Z780 Asymptomatic menopausal state: Secondary | ICD-10-CM | POA: Diagnosis not present

## 2018-02-15 DIAGNOSIS — E2839 Other primary ovarian failure: Secondary | ICD-10-CM

## 2018-02-15 DIAGNOSIS — E559 Vitamin D deficiency, unspecified: Secondary | ICD-10-CM

## 2018-02-21 ENCOUNTER — Telehealth: Payer: Self-pay | Admitting: Family Medicine

## 2018-02-21 NOTE — Telephone Encounter (Signed)
Patient notified that results are in and that Dr. Raliegh Scarlet wants to address them with her face to face at her appointment on 03/05/2018. MPulliam, CMA/RT(R)

## 2018-02-21 NOTE — Telephone Encounter (Signed)
Patient called wanted to know if the results of her bone density test taken last week were in.  --Forwarding message to medical assistant. --Dion Body

## 2018-02-22 DIAGNOSIS — M5136 Other intervertebral disc degeneration, lumbar region: Secondary | ICD-10-CM | POA: Diagnosis not present

## 2018-03-05 ENCOUNTER — Ambulatory Visit (INDEPENDENT_AMBULATORY_CARE_PROVIDER_SITE_OTHER): Payer: Medicare Other | Admitting: Family Medicine

## 2018-03-05 ENCOUNTER — Encounter: Payer: Self-pay | Admitting: Family Medicine

## 2018-03-05 VITALS — BP 151/84 | HR 61 | Ht 62.0 in | Wt 235.2 lb

## 2018-03-05 DIAGNOSIS — G459 Transient cerebral ischemic attack, unspecified: Secondary | ICD-10-CM | POA: Diagnosis not present

## 2018-03-05 DIAGNOSIS — I10 Essential (primary) hypertension: Secondary | ICD-10-CM | POA: Diagnosis not present

## 2018-03-05 DIAGNOSIS — R55 Syncope and collapse: Secondary | ICD-10-CM | POA: Diagnosis not present

## 2018-03-05 DIAGNOSIS — Z79899 Other long term (current) drug therapy: Secondary | ICD-10-CM | POA: Insufficient documentation

## 2018-03-05 DIAGNOSIS — I739 Peripheral vascular disease, unspecified: Secondary | ICD-10-CM

## 2018-03-05 DIAGNOSIS — Z6841 Body Mass Index (BMI) 40.0 and over, adult: Secondary | ICD-10-CM

## 2018-03-05 MED ORDER — LISINOPRIL 20 MG PO TABS: 20.0000 mg | ORAL_TABLET | Freq: Every day | ORAL | 3 refills | Status: DC

## 2018-03-05 NOTE — Progress Notes (Signed)
Impression and Recommendations:    1. Syncope, unspecified syncope type-  acute on chronic   2. 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Kimberly Ferrell)   3. Essential hypertension   4. h/o TIA (transient ischemic attack)   5. BMI 40.0-44.9, adult (Kimberly Ferrell)   6. Medication course changed     1. Syncope, unspecified syncope type-  acute on chronic - Resolved with changes in blood pressure meds and discontinuing beta blocker.  2. 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Kimberly Ferrell) - Upon further questioning in office today, it is not what pt described prior.  - Patient cancelled her previously scheduled follow-up ultrasounds and stress tests ordered in September 2019, including echocardiogram and bilateral carotids.  - Encouraged patient to follow up with stress tests and procedures as prescribed.  3. Hypertension - Reviewed goal BP of less than 140/90.  - At home, doing better, heart rate has improved after discontinuing beta blocker last visit, but systolic BP still running in upper 140's to 150 range.  - Blood pressure remains sub-optimally controlled at this time. - Increase dose to 20 mg lisinopril from 10 mg to improve BP. - Begin taking all medications fully as prescribed.  See med list below. - Patient tolerating meds well without complication.  Denies S-E.  - Lifestyle changes such as dash diet and engaging in a regular exercise program discussed with patient.  Educational handouts provided.  - Ambulatory BP monitoring encouraged.  Keep log and bring in next OV.  - Reviewed symptoms and exercise-induced onset of claudication with patient today.  Patient denies LE claudication at this time.  4. Neurology Follow-Up - h/o TIA (transient ischemic attack) - Advised patient to call Dr. Tomi Likens and visit him for follow-up before November 3rd. - Strongly advised patient to follow up with neurology as recommended.  - Advised patient to follow directions and treatment as advised by  neurology.  - Encouraged patient to elevate legs to alleviate neuropathy as needed.  5. BMI Counseling Explained to patient what BMI refers to, and what it means medically.  Told patient to think about it as a "medical risk stratification measurement" and how increasing BMI is associated with increasing risk/ or worsening state of various diseases such as hypertension, hyperlipidemia, diabetes, premature OA, depression etc.  American Heart Association guidelines for healthy diet, basically Mediterranean diet, and exercise guidelines of 30 minutes 5 days per week or more discussed in detail.  Health counseling performed.  All questions answered.  - Encouraged patient to lose weight.  6. Lifestyle & Preventative Health Maintenance - Advised patient to continue working toward exercising to improve overall mental, physical, and emotional health.    - Encouraged patient to engage in daily physical activity, especially a formal exercise routine as tolerated.  Recommended that the patient eventually strive for at least 150 minutes of moderate cardiovascular activity per week according to guidelines established by the [facility].   - Healthy dietary habits encouraged, including low-carb, and high amounts of lean protein in diet.   - Patient should also consume adequate amounts of Ferrell.  7. Follow-Up - Prescriptions refilled today PRN. - Re-check fasting lab work as recommended.  BMP re-checked today. - Keep appointments with specialists as scheduled and recommended.  - Otherwise, continue to return for CPE and chronic follow-up as scheduled.  - Patient knows to call in sooner if desired to address acute concerns.   Education and routine counseling performed. Handouts provided.   Orders Placed This Encounter  Procedures  .  Basic metabolic panel    Meds ordered this encounter  Medications  . lisinopril (PRINIVIL,ZESTRIL) 20 MG tablet    Sig: Take 1 tablet (20 mg total) by mouth daily.     Dispense:  90 tablet    Refill:  3    Medications Discontinued During This Encounter  Medication Reason  . lisinopril (PRINIVIL,ZESTRIL) 10 MG tablet      The patient was counseled, risk factors were discussed, anticipatory guidance given.  Gross side effects, risk and benefits, and alternatives of medications discussed with patient.  Patient is aware that all medications have potential side effects and we are unable to predict every side effect or drug-drug interaction that may occur.  Expresses verbal understanding and consents to current therapy plan and treatment regimen.  Return for f/up 3-4 mo for BP, Pre-Diab, etc etc.  Please see AVS handed out to patient at the end of our visit for further patient instructions/ counseling done pertaining to today's office visit.    Note:  This document was prepared using Dragon voice recognition software and may include unintentional dictation errors.  This document serves as a record of services personally performed by Mellody Dance, DO. It was created on her behalf by [name], a trained medical scribe. The creation of this record is based on the scribe's personal observations and the provider's statements to them.   I have reviewed the above medical documentation for accuracy and completeness and I concur.  Mellody Dance 03/05/18 5:05 PM     Subjective:     HPI: Kimberly Ferrell is a 77 y.o. female who presents to [place] [facility] today for follow up of [place].    Patient called Dr. Tomi Likens for an appointment.  He was out of the office at that time, and she forgot to call back to schedule.  Patient believes that her last echocardiogram stress test was in 2016 or 2017, maybe prior.  She was sent for this test from an urgent care system.  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 Peri Maris PA.  Was told that she has a pinched nerve in her back and had an X-ray done.  Patient was put on  flexeril for muscle relaxers, with some improvement.  At night, her legs still often sting and burn.  Dr. Maureen Ralphs told her these symptoms were "due to her nerves" (likely neuropathy) and these symptoms will likely continue.  States that her LE's sting during the day a little bit, but these symptoms are relieved while her LE is elevated on the recliner.  Patient is on cortisone shots through Dr. Maureen Ralphs.  She receives these in her knees every 3 months, and this has helped with her leg/walking concerns.  Denies claudication at this time after being questioned about nature of sx during visit.  HTN:  -  Her blood pressure has been controlled at home.  Pt has been checking it at home.  States that her blood pressure at home has been around 130's in the lowest, 140's, up to 152/72.  She feels it's "been doing great" since she's been on this new blood pressure medication.  States she "feels like a different person" on this new medicine, and that her blood pressure has been doing great.  Notes she hasn't had any other spells, hasn't felt dizzy or anything.  - Patient reports good compliance with blood pressure medications.  - Denies medication S-E.  Patient feels she pees a lot, but denies  other S-E.   - Smoking Status noted.  - She denies new onset of: chest pain, exercise intolerance, shortness of breath, dizziness, denies lightheadedness, visual changes, headache, lower extremity swelling or claudication.  Denies burning in her legs while walking.  "Maybe once in a while, but not a whole lot."  Last 3 blood pressure readings in our office are as follows: BP Readings from Last 3 Encounters:  03/05/18 (!) 151/84  01/31/18 122/90  12/03/17 (!) 150/79    Pulse Readings from Last 3 Encounters:  03/05/18 61  01/31/18 60  12/03/17 68    Filed Weights   03/05/18 0953  Weight: 235 lb 3.2 oz (106.7 kg)     Patient Care Team    Relationship Specialty Notifications Start End  Mellody Dance, DO PCP - General Family Medicine  01/31/16   Gaynelle Arabian, MD Consulting Physician Orthopedic Surgery  01/31/16   Druscilla Brownie, MD Consulting Physician Dermatology  01/31/16   Anastasio Auerbach, MD Consulting Physician Gynecology  02/06/16   Pieter Partridge, DO Consulting Physician Neurology  11/02/16      Lab Results  Component Value Date   CREATININE 0.63 12/03/2017   BUN 8 12/03/2017   NA 143 12/03/2017   K 4.5 12/03/2017   CL 103 12/03/2017   CO2 22 12/03/2017    Lab Results  Component Value Date   CHOL 122 11/10/2016   CHOL 136 04/16/2013    Lab Results  Component Value Date   HDL 40 11/10/2016   HDL 33.30 (L) 04/16/2013    Lab Results  Component Value Date   LDLCALC 62 11/10/2016   Continental 80 04/16/2013    Lab Results  Component Value Date   TRIG 98 11/10/2016   TRIG 116.0 04/16/2013    Lab Results  Component Value Date   CHOLHDL 3.1 11/10/2016   CHOLHDL 4 04/16/2013    No results found for: LDLDIRECT ===================================================================   Patient Active Problem List   Diagnosis Date Noted  . BMI 40.0-44.9, adult ([place]) 11/17/2016    Priority: High  . HTN (hypertension) 06/15/2011    Priority: High  . Glucose intolerance (impaired glucose tolerance) : A1c elevated 11/17/2016    Priority: Medium  . TIA (transient ischemic attack) 01/31/2016    Priority: Medium  . Vitamin D deficiency 11/17/2016    Priority: Low  . GERD (gastroesophageal reflux disease) 11/02/2016    Priority: Low  . OA (osteoarthritis) of knee 05/23/2013    Priority: Low  . Myalgia and myositis 01/30/2012    Priority: Low  . Medication course changed 03/05/2018  . 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[place]) 01/31/2018  . Syncope 01/31/2018  . 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 Elevated ALT measurement 12/03/2017  . Gassiness/ belching 06/20/2017  . Muscle spasm of back 05/21/2017  . Obesity, Class III, BMI  40-49.9 (morbid obesity) ([place]) 03/06/2017  . Pre-operative examination 07/31/2016  . Vaginal mass 07/23/2014  . OAB (overactive bladder) 04/16/2013  . Acute bronchitis 04/16/2013  . Post-nasal drip 05/30/2012  . Finger injury 05/30/2012  . Lung nodule seen on imaging study 01/23/2012  . Flank pain 01/22/2012  . Radicular low back pain 01/22/2012  . Rosacea 06/25/2011  . Back pain 06/15/2011  . Urinary frequency 06/15/2011     Past Medical History:  Diagnosis Date  . Arthritis   . Cancer ([place])    skin cancer removed from right forehead  . Chicken pox as child  . Colon polyps   .  Complication of anesthesia    "hard to wake up"  . GERD (gastroesophageal reflux disease)    occasional  . Hemorrhoids    "sometimes"  . Hypertension   . Lung nodule seen on imaging study    noted 8/13- needs CT f/u in 1 year  . Mononucleosis   . Pneumonia    hx of  . PONV (postoperative nausea and vomiting)   . Rosacea   . TIA (transient ischemic attack) 2007   from IV dye, no residual, passed out and was told by RN that it was TIA     Past Surgical History:  Procedure Laterality Date  . ABDOMINAL HYSTERECTOMY  1993  . BREAST BIOPSY Left 2004   [place]   . CHOLECYSTECTOMY  1992  . HEMORRHOID SURGERY  1960's  . PARTIAL KNEE ARTHROPLASTY Right 08/09/2016   Procedure: RIGHT UNICOMPARTMENTAL KNEE;  Surgeon: Gaynelle Arabian, MD;  Location: [facility];  Service: Orthopedics;  Laterality: Right;  requests 1hr; Adductor Block  . TOTAL KNEE ARTHROPLASTY Left 05/23/2013   Procedure: LEFT TOTAL KNEE ARTHROPLASTY;  Surgeon: Gearlean Alf, MD;  Location: [facility];  Service: Orthopedics;  Laterality: Left;     Family History  Problem Relation Age of Onset  . Cancer Mother 25       colon  . Sudden death Father 24       suicide  . Stroke Maternal Grandmother 78  . Hypertension Neg Hx   . Hyperlipidemia Neg Hx   . Heart attack Neg Hx   . Diabetes Neg Hx   . Breast cancer Neg Hx      Social History    Substance and Sexual Activity  Drug Use No  ,  Social History   Substance and Sexual Activity  Alcohol Use No  ,  Social History   Tobacco Use  Smoking Status Never Smoker  Smokeless Tobacco Never Used  ,    Current Outpatient Medications on File Prior to Visit  Medication Sig Dispense Refill  . aspirin EC 81 MG tablet Take 81 mg by mouth daily.    . chlorthalidone (HYGROTON) 25 MG tablet Take 0.5 tablets (12.5 mg total) by mouth daily. 45 tablet 0  . cyclobenzaprine (FLEXERIL) 10 MG tablet Take 1 tablet (10 mg total) by mouth 3 (three) times daily as needed for muscle spasms. 30 tablet 0  . metroNIDAZOLE (METROCREAM) 0.75 % cream Apply 1 application topically 2 (two) times daily.    Marland Kitchen omeprazole (PRILOSEC) 20 MG capsule Take 1 capsule (20 mg total) by mouth daily. 90 capsule 1  . Vitamin D, Ergocalciferol, (DRISDOL) 50000 units CAPS capsule Take 1 capsule (50,000 Units total) by mouth 2 (two) times a week. 24 capsule 3   No current facility-administered medications on file prior to visit.      Allergies  Allergen Reactions  . Other Other (See Comments)    IV dye, heart stopped  . Amlodipine Nausea And Vomiting    Dizziness  . Valsartan Rash     Review of Systems:   General:  Denies fever, chills Optho/Auditory:   Denies visual changes, blurred vision Respiratory:   Denies SOB, cough, wheeze, DIB  Cardiovascular:   Denies chest pain, palpitations, painful respirations Gastrointestinal:   Denies nausea, vomiting, diarrhea.  Endocrine:     Denies new hot or cold intolerance Musculoskeletal:  Denies joint swelling, gait issues, or new unexplained myalgias/ arthralgias Skin:  Denies rash, suspicious lesions  Neurological:    Denies dizziness, unexplained  weakness, numbness  Psychiatric/Behavioral:   Denies mood changes  Objective:    Blood pressure (!) 151/84, pulse 61, height 5\' 2"  (1.575 m), weight 235 lb 3.2 oz (106.7 kg), SpO2 97 %.  Body mass index is 43.02  kg/m.  General: Well Developed, well nourished, and in no acute distress.  HEENT: Normocephalic, atraumatic, pupils equal round reactive to light, neck supple, No carotid bruits, no JVD Skin: Warm and dry, cap RF less 2 sec Cardiac: Regular rate and rhythm, S1, S2 WNL's, no murmurs rubs or gallops Respiratory: ECTA B/L, Not using accessory muscles, speaking in full sentences. NeuroM-Sk: Ambulates w/o assistance, moves ext * 4 w/o difficulty, sensation grossly intact.  Ext: scant edema b/l lower ext Psych: No HI/SI, judgement and insight good, Euthymic mood. Full Affect.

## 2018-04-04 DIAGNOSIS — D485 Neoplasm of uncertain behavior of skin: Secondary | ICD-10-CM | POA: Diagnosis not present

## 2018-04-05 ENCOUNTER — Ambulatory Visit: Payer: Medicare Other | Admitting: Family Medicine

## 2018-04-05 DIAGNOSIS — C44319 Basal cell carcinoma of skin of other parts of face: Secondary | ICD-10-CM | POA: Diagnosis not present

## 2018-04-15 ENCOUNTER — Ambulatory Visit (INDEPENDENT_AMBULATORY_CARE_PROVIDER_SITE_OTHER): Payer: Medicare Other | Admitting: Adult Health

## 2018-04-15 ENCOUNTER — Encounter: Payer: Self-pay | Admitting: Adult Health

## 2018-04-15 ENCOUNTER — Other Ambulatory Visit: Payer: Self-pay

## 2018-04-15 VITALS — BP 159/80 | HR 77 | Temp 98.1°F | Ht 62.0 in | Wt 237.2 lb

## 2018-04-15 DIAGNOSIS — R319 Hematuria, unspecified: Secondary | ICD-10-CM

## 2018-04-15 DIAGNOSIS — R350 Frequency of micturition: Secondary | ICD-10-CM

## 2018-04-15 DIAGNOSIS — R8290 Unspecified abnormal findings in urine: Secondary | ICD-10-CM | POA: Insufficient documentation

## 2018-04-15 LAB — POCT URINALYSIS DIPSTICK
Bilirubin, UA: NEGATIVE
Glucose, UA: NEGATIVE
KETONES UA: NEGATIVE
Nitrite, UA: NEGATIVE
Protein, UA: POSITIVE — AB
UROBILINOGEN UA: 0.2 U/dL
pH, UA: 5.5 (ref 5.0–8.0)

## 2018-04-15 MED ORDER — NITROFURANTOIN MONOHYD MACRO 100 MG PO CAPS: 100.0000 mg | ORAL_CAPSULE | Freq: Two times a day (BID) | ORAL | 0 refills | Status: DC

## 2018-04-15 NOTE — Assessment & Plan Note (Signed)
UA: Blo Mod Nit Neg Leu Large Please take Nitrofurantoin (Macrobid) as directed. Increase fluids. We will call you when urine culture/sensitivity results are available. Continue with OTC Acetaminophen as needed. Please call clinic with questions/concerns.

## 2018-04-15 NOTE — Progress Notes (Signed)
Subjective:    Patient ID: Kimberly Ferrell, female    DOB: 1940/08/16, 77 y.o.   MRN: [id number]  HPI:  Kimberly Ferrell presents with urinary frequency and hematuria that started 5 days ago. She also reports intermittent low grade fever (highest 100.3 f). She also reports mild flank pain 2/10. She denies N/V/D/hematochezia She denies abdominal pain or change in appetite She denies CP/dyspnea/palpitations Husband at [facility] during [place]  Patient Care Team    Relationship Specialty Notifications Start End  Mellody Dance, DO PCP - General Family Medicine  01/31/16   Gaynelle Arabian, MD Consulting Physician Orthopedic Surgery  01/31/16   Druscilla Brownie, MD Consulting Physician Dermatology  01/31/16   Anastasio Auerbach, MD Consulting Physician Gynecology  02/06/16   Pieter Partridge, [place] Physician Neurology  11/02/16     Patient Active Problem List   Diagnosis Date Noted  . Abnormal urinalysis 04/15/2018  . Medication course changed 03/05/2018  . 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[place]) 01/31/2018  . Syncope 01/31/2018  . 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 Elevated ALT measurement 12/03/2017  . Gassiness/ belching 06/20/2017  . Muscle spasm of back 05/21/2017  . Obesity, Class III, BMI 40-49.9 (morbid obesity) ([place]) 03/06/2017  . Glucose intolerance (impaired glucose tolerance) : A1c elevated 11/17/2016  . BMI 40.0-44.9, adult ([place]) 11/17/2016  . Vitamin D deficiency 11/17/2016  . GERD (gastroesophageal reflux disease) 11/02/2016  . Pre-operative examination 07/31/2016  . TIA (transient ischemic attack) 01/31/2016  . Vaginal mass 07/23/2014  . OA (osteoarthritis) of knee 05/23/2013  . OAB (overactive bladder) 04/16/2013  . Acute bronchitis 04/16/2013  . Post-nasal drip 05/30/2012  . Finger injury 05/30/2012  . Myalgia and myositis 01/30/2012  . Lung nodule seen on imaging study 01/23/2012  . Flank pain 01/22/2012  . Radicular low back pain 01/22/2012  .  Rosacea 06/25/2011  . HTN (hypertension) 06/15/2011  . Back pain 06/15/2011  . Urinary frequency 06/15/2011     Past Medical History:  Diagnosis Date  . Arthritis   . Cancer ([place])    skin cancer removed from right forehead  . Chicken pox as child  . Colon polyps   . Complication of anesthesia    "hard to wake up"  . GERD (gastroesophageal reflux disease)    occasional  . Hemorrhoids    "sometimes"  . Hypertension   . Lung nodule seen on imaging study    noted 8/13- needs CT f/u in 1 year  . Mononucleosis   . Pneumonia    hx of  . PONV (postoperative nausea and vomiting)   . Rosacea   . TIA (transient ischemic attack) 2007   from IV dye, no residual, passed out and was told by RN that it was TIA     Past Surgical History:  Procedure Laterality Date  . ABDOMINAL HYSTERECTOMY  1993  . BREAST BIOPSY Left 2004   [place] Core   . CHOLECYSTECTOMY  1992  . HEMORRHOID SURGERY  1960's  . PARTIAL KNEE ARTHROPLASTY Right 08/09/2016   Procedure: RIGHT UNICOMPARTMENTAL KNEE;  Surgeon: Gaynelle Arabian, MD;  Location: [facility];  Service: Orthopedics;  Laterality: Right;  requests 1hr; Adductor Block  . TOTAL KNEE ARTHROPLASTY Left 05/23/2013   Procedure: LEFT TOTAL KNEE ARTHROPLASTY;  Surgeon: Gearlean Alf, MD;  Location: [facility];  Service: Orthopedics;  Laterality: Left;     Family History  Problem Relation Age of Onset  . Cancer Mother 10  colon  . Sudden death Father 32       suicide  . Stroke Maternal Grandmother 78  . Hypertension Neg Hx   . Hyperlipidemia Neg Hx   . Heart attack Neg Hx   . Diabetes Neg Hx   . Breast cancer Neg Hx      Social History   Substance and Sexual Activity  Drug Use No     Social History   Substance and Sexual Activity  Alcohol Use No     Social History   Tobacco Use  Smoking Status Never Smoker  Smokeless Tobacco Never Used     Outpatient Encounter Medications as of 04/15/2018  Medication Sig  . aspirin EC 81 MG tablet  Take 81 mg by mouth daily.  . chlorthalidone (HYGROTON) 25 MG tablet Take 0.5 tablets (12.5 mg total) by mouth daily.  . cyclobenzaprine (FLEXERIL) 10 MG tablet Take 1 tablet (10 mg total) by mouth 3 (three) times daily as needed for muscle spasms.  Marland Kitchen lisinopril (PRINIVIL,ZESTRIL) 20 MG tablet Take 1 tablet (20 mg total) by mouth daily.  . metroNIDAZOLE (METROCREAM) 0.75 % cream Apply 1 application topically 2 (two) times daily.  Marland Kitchen omeprazole (PRILOSEC) 20 MG capsule Take 1 capsule (20 mg total) by mouth daily.  . Vitamin D, Ergocalciferol, (DRISDOL) 50000 units CAPS capsule Take 1 capsule (50,000 Units total) by mouth 2 (two) times a week.  . nitrofurantoin, macrocrystal-monohydrate, (MACROBID) 100 MG capsule Take 1 capsule (100 mg total) by mouth 2 (two) times daily.   No facility-administered encounter medications on file as of 04/15/2018.     Allergies: Other; Amlodipine; and Valsartan  Body mass index is 43.38 kg/m.  Blood pressure (!) 159/80, pulse 77, temperature 98.1 F (36.7 C), temperature source Oral, height 5\' 2"  (1.575 m), weight 237 lb 3.2 oz (107.6 kg), SpO2 94 %.  Review of Systems  Constitutional: Positive for fatigue. Negative for activity change, appetite change, chills, diaphoresis, fever and unexpected weight change.  Respiratory: Negative for cough, chest tightness, shortness of breath, wheezing and stridor.   Cardiovascular: Negative for chest pain, palpitations and leg swelling.  Gastrointestinal: Negative for abdominal distention, abdominal pain, blood in stool, constipation, diarrhea, nausea, rectal pain and vomiting.  Genitourinary: Positive for flank pain, frequency and hematuria. Negative for dysuria and menstrual problem.  Hematological: Does not bruise/bleed easily.       Objective:   Physical Exam  Constitutional: She is oriented to person, place, and time. She appears well-developed and well-nourished. No distress.  HENT:  Head: Normocephalic and  atraumatic.  Right Ear: External ear normal.  Left Ear: External ear normal.  Nose: Nose normal.  Mouth/Throat: Oropharynx is clear and moist.  Eyes: Pupils are equal, round, and reactive to light. Conjunctivae and EOM are normal.  Cardiovascular: Normal rate, regular rhythm, normal heart sounds and intact distal pulses.  Pulmonary/Chest: Effort normal and breath sounds normal. No stridor. No respiratory distress. She has no wheezes. She has no rales. She exhibits no tenderness.  Abdominal: Soft. Bowel sounds are normal. She exhibits no distension and no mass. There is no tenderness. There is CVA tenderness. There is no rebound and no guarding. No hernia.  Neurological: She is alert and oriented to person, place, and time.  Skin: Skin is warm and dry. Capillary refill takes less than 2 seconds. No rash noted. She is not diaphoretic. No erythema. No pallor.  Psychiatric: She has a normal mood and affect. Her behavior is normal. Judgment and thought content  normal.  Nursing note and vitals reviewed.     Assessment & Plan:   1. Hematuria, unspecified type   2. Abnormal urinalysis     Abnormal urinalysis UA: Blo Mod Nit Neg Leu Large Please take Nitrofurantoin (Macrobid) as directed. Increase fluids. We will call you when urine culture/sensitivity results are available. Continue with OTC Acetaminophen as needed. Please call clinic with questions/concerns.    FOLLOW-UP:  Return if symptoms worsen or fail to improve.

## 2018-04-15 NOTE — Patient Instructions (Addendum)

## 2018-04-19 LAB — CULTURE, URINE COMPREHENSIVE

## 2018-05-01 ENCOUNTER — Encounter: Payer: Self-pay | Admitting: Family Medicine

## 2018-05-01 ENCOUNTER — Ambulatory Visit (INDEPENDENT_AMBULATORY_CARE_PROVIDER_SITE_OTHER): Payer: Medicare Other | Admitting: Family Medicine

## 2018-05-01 VITALS — BP 129/85 | HR 68 | Temp 98.6°F | Ht 62.0 in | Wt 238.0 lb

## 2018-05-01 DIAGNOSIS — R053 Chronic cough: Secondary | ICD-10-CM

## 2018-05-01 DIAGNOSIS — E860 Dehydration: Secondary | ICD-10-CM

## 2018-05-01 DIAGNOSIS — M62830 Muscle spasm of back: Secondary | ICD-10-CM | POA: Diagnosis not present

## 2018-05-01 DIAGNOSIS — R05 Cough: Secondary | ICD-10-CM | POA: Diagnosis not present

## 2018-05-01 DIAGNOSIS — R0982 Postnasal drip: Secondary | ICD-10-CM | POA: Diagnosis not present

## 2018-05-01 NOTE — Progress Notes (Signed)
Acute Care Office visit  Assessment and plan:  1. Persistent dry cough   2. Post-nasal drainage   3. Dehydration, mild   4. Muscle spasm of back     Chronic Cough - Post-Nasal Drainage, Dehydration - Viral vs Allergic vs Bacterial causes for pt's symptoms reveiwed.    - Discussed that dryness and dehydration this time of year can contribute to coughing.  - Discussed decreased atmospheric humidity and decreased home humidity during this time of year.  Reviewed the physiologic effects this dryness has on sinus and nasal passages.    - Humidifier by bedside at night encouraged.     - Advised the patient to begin using AYR or Neilmed sinus rinses BID followed by flonase BID (one spray to each nostril). Advised that the patient may also incorporate allegra or claritin PRN.   - Encouraged patient to drink adequate amounts of water.  - Supportive care and various OTC medications discussed in addition to any prescribed.  - Call or RTC if new symptoms, or if no improvement or worse over next several days.    Muscle Spasm of Back - Discussed that patient should hydrate adequately and engage in physical activity to help alleviate symptoms.  Cancerous Skin Lesion Removed - Advised patient to follow up with specialists as prescribed. - Encouraged patient to follow all recommendations of dermatology.   Medications Discontinued During This Encounter  Medication Reason  . nitrofurantoin, macrocrystal-monohydrate, (MACROBID) 100 MG capsule Completed Course     Pt understands however, if symptoms do not improve with proposed treatment regiment, she will follow-up and let us know that it is not improving and further evaluation might be warranted at that time.   Expresses verbal understanding and consents to current therapy plan and treatment regiment.   Education and routine counseling performed. Handouts provided.  Anticipatory guidance and routine counseling done re: condition,  txmnt options and need for follow up. All questions of patient's were answered.  Return if symptoms worsen or fail to improve, for f/up chronic care as previously discussed.  Please see AVS handed out to patient at the end of our visit for additional patient instructions/ counseling done pertaining to today's office visit.  Note:  This document was partially repared using Dragon voice recognition software and may include unintentional dictation errors.  This document serves as a record of services personally performed by Mellody Dance, DO. It was created on her behalf by [name], a trained medical scribe. The creation of this record is based on the scribe's personal observations and the provider's statements to them.   I have reviewed the above medical documentation for accuracy and completeness and I concur.  Mellody Dance, DO 05/02/2018 11:34 AM       Subjective:    Chief Complaint  Patient presents with  . Cough    Patient did have her skin spot removed on her right temple.  Notes that the area was found to be cancerous, and she is going to the "skin place" to have surgery to remove this.  HPI:  Pt presents with sx for the past few weeks.   C/o:  Has a chronic cough that makes her "get to gagging."  Had to use some water and a mint at church to finally make the cough go away.  "I'll just be talking or being on the phone and I'll just get to coughing."  Notes sometimes she wakes up at night "just coughing."  The cough can  erupt any time.  "I can just be sitting here and all of a sudden I'll start coughing."  Husband states he also hears her cough at night.  Denies: Cold or virus symptoms.    For symptoms patient has tried: Water and mints.  Overall getting: Feels that her cough will not go away.   Patient Care Team    Relationship Specialty Notifications Start End  Mellody Dance, DO PCP - General Family Medicine  01/31/16   Gaynelle Arabian, MD  Consulting Physician Orthopedic Surgery  01/31/16   Druscilla Brownie, MD Consulting Physician Dermatology  01/31/16   Anastasio Auerbach, MD Consulting Physician Gynecology  02/06/16   Pieter Partridge, DO Consulting Physician Neurology  11/02/16     Past medical history, Surgical history, Family history reviewed and noted below, Social history, Allergies, and Medications have been entered into the medical record, reviewed and changed as needed.   Allergies  Allergen Reactions  . Other Other (See Comments)    IV dye, heart stopped  . Amlodipine Nausea And Vomiting    Dizziness  . Valsartan Rash    Review of Systems: - see above HPI for pertinent positives General:   No F/C, wt loss Pulm:   No DIB, pleuritic chest pain Card:  No CP, palpitations Abd:  No n/v/d or pain Ext:  No inc edema from baseline   Objective:   Blood pressure 129/85, pulse 68, temperature 98.6 F (37 C), height 5\' 2"  (1.575 m), weight 238 lb (108 kg), SpO2 98 %. Body mass index is 43.53 kg/m. General: Well Developed, well nourished, appropriate for stated age.  Neuro: Alert and oriented x3, extra-ocular muscles intact, sensation grossly intact.  HEENT: Normocephalic, atraumatic, pupils equal round reactive to light, neck supple, no masses, no painful lymphadenopathy, TM's intact B/L, no acute findings. Nares- patent, clear d/c, OP- Posterior oropharynx very erythematous with post nasal drip, No TTP sinuses Skin: Warm and dry, no gross rash. Cardiac: RRR, S1 S2,  no murmurs rubs or gallops.  Respiratory: ECTA B/L and A/P, Not using accessory muscles, speaking in full sentences- unlabored. Vascular:  No gross lower ext edema, cap RF less 2 sec. Psych: No HI/SI, judgement and insight good, Euthymic mood. Full Affect.

## 2018-06-05 ENCOUNTER — Ambulatory Visit (INDEPENDENT_AMBULATORY_CARE_PROVIDER_SITE_OTHER): Payer: Medicare Other | Admitting: Family Medicine

## 2018-06-05 ENCOUNTER — Encounter: Payer: Self-pay | Admitting: Family Medicine

## 2018-06-05 VITALS — BP 152/80 | HR 64 | Temp 97.7°F | Ht 62.0 in | Wt 237.7 lb

## 2018-06-05 DIAGNOSIS — R7302 Impaired glucose tolerance (oral): Secondary | ICD-10-CM | POA: Diagnosis not present

## 2018-06-05 DIAGNOSIS — E559 Vitamin D deficiency, unspecified: Secondary | ICD-10-CM | POA: Diagnosis not present

## 2018-06-05 DIAGNOSIS — I10 Essential (primary) hypertension: Secondary | ICD-10-CM | POA: Diagnosis not present

## 2018-06-05 DIAGNOSIS — Z6841 Body Mass Index (BMI) 40.0 and over, adult: Secondary | ICD-10-CM

## 2018-06-05 DIAGNOSIS — R7401 Elevation of levels of liver transaminase levels: Secondary | ICD-10-CM

## 2018-06-05 DIAGNOSIS — R5383 Other fatigue: Secondary | ICD-10-CM | POA: Insufficient documentation

## 2018-06-05 DIAGNOSIS — K219 Gastro-esophageal reflux disease without esophagitis: Secondary | ICD-10-CM

## 2018-06-05 DIAGNOSIS — R740 Nonspecific elevation of levels of transaminase and lactic acid dehydrogenase [LDH]: Secondary | ICD-10-CM

## 2018-06-05 DIAGNOSIS — G459 Transient cerebral ischemic attack, unspecified: Secondary | ICD-10-CM

## 2018-06-05 LAB — POCT GLYCOSYLATED HEMOGLOBIN (HGB A1C): HEMOGLOBIN A1C: 5.9 % — AB (ref 4.0–5.6)

## 2018-06-05 MED ORDER — CARVEDILOL 6.25 MG PO TABS: 6.2500 mg | ORAL_TABLET | Freq: Two times a day (BID) | ORAL | 0 refills | Status: DC

## 2018-06-05 MED ORDER — LISINOPRIL 10 MG PO TABS: 10.0000 mg | ORAL_TABLET | Freq: Every day | ORAL | 3 refills | Status: DC

## 2018-06-05 MED ORDER — OMEPRAZOLE 20 MG PO CPDR: 20.0000 mg | DELAYED_RELEASE_CAPSULE | Freq: Two times a day (BID) | ORAL | 1 refills | Status: DC

## 2018-06-05 NOTE — Progress Notes (Signed)
Impression and Recommendations:    1. Glucose intolerance (impaired glucose tolerance) : A1c elevated   2. Essential hypertension   3. h/o TIA (transient ischemic attack)   4. BMI 40.0-44.9, adult ([place])   5. Vitamin D deficiency   6. Elevated ALT measurement   7. Fatigue, unspecified type   8. Gastroesophageal reflux disease, esophagitis presence not specified     - Patient having lab work drawn today.  1. Prediabetes, A1c Elevated - Impaired Glucose Tolerance - A1c 5.9 today.  - Counseled patient on prevention of disease and discussed prudent dietary and lifestyle modifications as first line.  Importance of low carb/ketogenic diet discussed with patient in addition to regular exercise.   2. Hypertension - Blood pressure elevated on intake today. - States she has not taken her BP medicine or eaten this morning.  - Blood pressure not well-controlled at this time. - Recommended change in treatment plan today.  See med list below.  - Reduced lisinopril from 20 to 10, for concerns of dry cough. - Begin beta blocker (carvedilol). - Will continue to monitor.  - Lifestyle changes such as dash diet and engaging in a regular exercise program discussed with patient.  Educational handouts provided  - Ambulatory BP monitoring encouraged. Keep log and bring in next OV.  3. Ongoing Cough - Encouraged use of air purifier in the house.  - Encouraged use of humidifier in house during winter months. - Discussed importance of draining the humidifier and cleaning it appropriately.  - Encouraged adequate hydration.  4. GERD - Ongoing Heartburn - Begin omeprazole daily. - To start, take 20 mg in the morning and 20 mg at night for two weeks. - Then, begin taking omeprazole once per day.  - Extensively educated patient about heartburn.  All questions were answered. - Reviewed triggers and trigger foods in particular, especially caffeine and acidic foods.  - Educated patient to avoid  consumption of all food or water within 2-3 hours of lying down.  - Discussed that patient may utilize Tums for immediate relief, and otherwise utilize omeprazole.  - For chronic dry cough ongoing for a month, if symptoms do not improve on 3-4 weeks of treatment, air purifier, etc., patient knows to call and obtain referral to pulmonology.  5. 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  - Per patient, dermatologist told her to go to skin surgery and "get it cut out," and was recommended to Dr. Janann August.  - Patient was told that she needs to go to a Mohs Surgeon to have a patch of skin removed from her face.  - Encouraged patient to follow up with skin surgery as recommended.  - Extensive education provided to patient today.  All questions were answered.  6. BMI Counseling - Body mass index is 43.48 kg/m. Explained to patient what BMI refers to, and what it means medically.    Told patient to think about it as a "medical risk stratification measurement" and how increasing BMI is associated with increasing risk/ or worsening state of various diseases such as hypertension, hyperlipidemia, diabetes, premature OA, depression etc.  American Heart Association guidelines for healthy diet, basically Mediterranean diet, and exercise guidelines of 30 minutes 5 days per week or more discussed in detail.  Health counseling performed.  All questions answered.  7. Lifestyle & Preventative Health Maintenance - Advised patient to continue working toward exercising to improve overall mental, physical,  and emotional health.    - Reviewed the "spokes of the wheel" of mood and health management.  Stressed the importance of ongoing prudent habits, including regular exercise, appropriate sleep hygiene, healthful dietary habits, and prayer/meditation to relax.  - Encouraged patient to engage in daily physical activity, especially  a formal exercise routine.  Recommended that the patient eventually strive for at least 150 minutes of moderate cardiovascular activity per week according to guidelines established by the [facility].   - Healthy dietary habits encouraged, including low-carb, and high amounts of lean protein in diet.   - Patient should also consume adequate amounts of water.    Education and routine counseling performed. Handouts provided.  Orders Placed This Encounter  Procedures  . Lipid panel  . VITAMIN D 25 Hydroxy (Vit-D Deficiency, Fractures)  . Vitamin B12  . TSH  . T4, free  . CBC with Differential/Platelet  . Comprehensive metabolic panel  . POCT glycosylated hemoglobin (Hb A1C)    Meds ordered this encounter  Medications  . omeprazole (PRILOSEC) 20 MG capsule    Sig: Take 1 capsule (20 mg total) by mouth 2 (two) times daily before a meal. 2wks, then 1 po qd thereafter    Dispense:  104 capsule    Refill:  1  . carvedilol (COREG) 6.25 MG tablet    Sig: Take 1 tablet (6.25 mg total) by mouth 2 (two) times daily with a meal.    Dispense:  180 tablet    Refill:  0  . lisinopril (PRINIVIL,ZESTRIL) 10 MG tablet    Sig: Take 1 tablet (10 mg total) by mouth daily.    Dispense:  90 tablet    Refill:  3    Medications Discontinued During This Encounter  Medication Reason  . cyclobenzaprine (FLEXERIL) 10 MG tablet Completed Course  . lisinopril (PRINIVIL,ZESTRIL) 20 MG tablet   . omeprazole (PRILOSEC) 20 MG capsule Reorder     The patient was counseled, risk factors were discussed, anticipatory guidance given.  Gross side effects, risk and benefits, and alternatives of medications discussed with patient.  Patient is aware that all medications have potential side effects and we are unable to predict every side effect or drug-drug interaction that may occur.  Expresses verbal understanding and consents to current therapy plan and treatment regimen.  Return for 2) f/up 5 wks- add coreg for BP, tx  GERD( started omepr) w chronic cough,.  Please see AVS handed out to patient at the end of our visit for further patient instructions/ counseling done pertaining to today's office visit.    Note:  This document was prepared using Dragon voice recognition software and may include unintentional dictation errors.   This document serves as a record of services personally performed by Mellody Dance, DO. It was created on her behalf by [name], a trained medical scribe. The creation of this record is based on the scribe's personal observations and the provider's statements to them.   I have reviewed the above medical documentation for accuracy and completeness and I concur.  Mellody Dance, DO 06/09/2018 7:18 PM       Subjective:    HPI: Kimberly Ferrell is a 78 y.o. female who presents to [name] today for follow up of [place].    Patient states she has "not taken her blood pressure today."  Ongoing Cough & Tickle Has felt congested in her lungs.  Husband states she's coughing all the time, for about  three weeks now.  Husband states "she just keeps colds, and keeps coughing."  They are using a humidifier at night.  "I feel good other than this cough."   The coughing occurs all day long.  She also coughs during church, and often takes water and mint into church to help alleviate her cough.  Heartburn Patient has indigestion and takes Tums very often.  States she was throwing up last night due to her heartburn.  They ate chicken pie, pinto beans, coleslaw.  Denies chest pain.  Heartburn did not get worse when she got up and walked; states "it felt about the same."  HTN:  -  Her blood pressure may not be optimally controlled at home.  Pt has been checking it regularly.  Reports that it is running about 160 at home.  Says "sometimes it's 157, but the highest it's been is 160.  - Patient reports good compliance with blood  pressure medications  - Denies medication S-E   - Smoking Status noted   - She denies new onset of: chest pain, exercise intolerance, shortness of breath, dizziness, visual changes, headache, lower extremity swelling or claudication.   Last 3 blood pressure readings in our office are as follows: BP Readings from Last 3 Encounters:  06/05/18 (!) 152/80  05/01/18 129/85  04/15/18 (!) 159/80    Pulse Readings from Last 3 Encounters:  06/05/18 64  05/01/18 68  04/15/18 77    Filed Weights   06/05/18 1050  Weight: 237 lb 11.2 oz (107.8 kg)      Patient Care Team    Relationship Specialty Notifications Start End  Mellody Dance, DO PCP - General Family Medicine  01/31/16   Gaynelle Arabian, MD Consulting Physician Orthopedic Surgery  01/31/16   Druscilla Brownie, MD Consulting Physician Dermatology  01/31/16   Anastasio Auerbach, MD Consulting Physician Gynecology  02/06/16   Pieter Partridge, DO Consulting Physician Neurology  11/02/16      Lab Results  Component Value Date   CREATININE 0.79 06/05/2018   BUN 8 06/05/2018   NA 145 (H) 06/05/2018   K 4.4 06/05/2018   CL 104 06/05/2018   CO2 26 06/05/2018    Lab Results  Component Value Date   CHOL 127 06/05/2018   CHOL 122 11/10/2016   CHOL 136 04/16/2013    Lab Results  Component Value Date   HDL 46 06/05/2018   HDL 40 11/10/2016   HDL 33.30 (L) 04/16/2013    Lab Results  Component Value Date   LDLCALC 63 06/05/2018   LDLCALC 62 11/10/2016   [place] 80 04/16/2013    Lab Results  Component Value Date   TRIG 90 06/05/2018   TRIG 98 11/10/2016   TRIG 116.0 04/16/2013    Lab Results  Component Value Date   CHOLHDL 2.8 06/05/2018   CHOLHDL 3.1 11/10/2016   CHOLHDL 4 04/16/2013    No results found for: LDLDIRECT ===================================================================   Patient Active Problem List   Diagnosis Date Noted  . BMI 40.0-44.9, adult ([place]) 11/17/2016    Priority: High  . HTN  (hypertension) 06/15/2011    Priority: High  . Glucose intolerance (impaired glucose tolerance) : A1c elevated 11/17/2016    Priority: Medium  . TIA (transient ischemic attack) 01/31/2016    Priority: Medium  . Vitamin D deficiency 11/17/2016    Priority: Low  . GERD (gastroesophageal reflux disease) 11/02/2016    Priority: Low  . OA (osteoarthritis) of knee 05/23/2013  Priority: Low  . Myalgia and myositis 01/30/2012    Priority: Low  . Fatigue 06/05/2018  . Abnormal urinalysis 04/15/2018  . Medication course changed 03/05/2018  . 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[place]) 01/31/2018  . Syncope 01/31/2018  . 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 Elevated ALT measurement 12/03/2017  . Gassiness/ belching 06/20/2017  . Muscle spasm of back 05/21/2017  . Obesity, Class III, BMI 40-49.9 (morbid obesity) ([place]) 03/06/2017  . Pre-operative examination 07/31/2016  . Vaginal mass 07/23/2014  . OAB (overactive bladder) 04/16/2013  . Acute bronchitis 04/16/2013  . Post-nasal drip 05/30/2012  . Finger injury 05/30/2012  . Lung nodule seen on imaging study 01/23/2012  . Flank pain 01/22/2012  . Radicular low back pain 01/22/2012  . Rosacea 06/25/2011  . Back pain 06/15/2011  . Urinary frequency 06/15/2011     Past Medical History:  Diagnosis Date  . Arthritis   . Cancer ([place])    skin cancer removed from right forehead  . Chicken pox as child  . Colon polyps   . Complication of anesthesia    "hard to wake up"  . GERD (gastroesophageal reflux disease)    occasional  . Hemorrhoids    "sometimes"  . Hypertension   . Lung nodule seen on imaging study    noted 8/13- needs CT f/u in 1 year  . Mononucleosis   . Pneumonia    hx of  . PONV (postoperative nausea and vomiting)   . Rosacea   . TIA (transient ischemic attack) 2007   from IV dye, no residual, passed out and was told by RN that it was TIA     Past Surgical History:  Procedure Laterality Date    . ABDOMINAL HYSTERECTOMY  1993  . BREAST BIOPSY Left 2004   [place]   . CHOLECYSTECTOMY  1992  . HEMORRHOID SURGERY  1960's  . PARTIAL KNEE ARTHROPLASTY Right 08/09/2016   Procedure: RIGHT UNICOMPARTMENTAL KNEE;  Surgeon: Gaynelle Arabian, MD;  Location: [facility];  Service: Orthopedics;  Laterality: Right;  requests 1hr; Adductor Block  . TOTAL KNEE ARTHROPLASTY Left 05/23/2013   Procedure: LEFT TOTAL KNEE ARTHROPLASTY;  Surgeon: Gearlean Alf, MD;  Location: [facility];  Service: Orthopedics;  Laterality: Left;     Family History  Problem Relation Age of Onset  . Cancer Mother 48       colon  . Sudden death Father 42       suicide  . Stroke Maternal Grandmother 78  . Hypertension Neg Hx   . Hyperlipidemia Neg Hx   . Heart attack Neg Hx   . Diabetes Neg Hx   . Breast cancer Neg Hx      Social History   Substance and Sexual Activity  Drug Use No  ,  Social History   Substance and Sexual Activity  Alcohol Use No  ,  Social History   Tobacco Use  Smoking Status Never Smoker  Smokeless Tobacco Never Used  ,    Current Outpatient Medications on File Prior to Visit  Medication Sig Dispense Refill  . aspirin EC 81 MG tablet Take 81 mg by mouth daily.    . chlorthalidone (HYGROTON) 25 MG tablet Take 0.5 tablets (12.5 mg total) by mouth daily. 45 tablet 0  . metroNIDAZOLE (METROCREAM) 0.75 % cream Apply 1 application topically 2 (two) times daily.    . Vitamin D, Ergocalciferol, (DRISDOL) 50000 units CAPS capsule Take 1 capsule (50,000 Units total) by mouth  2 (two) times a week. 24 capsule 3   No current facility-administered medications on file prior to visit.      Allergies  Allergen Reactions  . Other Other (See Comments)    IV dye, heart stopped  . Amlodipine Nausea And Vomiting    Dizziness  . Valsartan Rash     Review of Systems:   General:  Denies fever, chills Optho/Auditory:   Denies visual changes, blurred vision Respiratory:   Denies SOB, cough, wheeze,  DIB  Cardiovascular:   Denies chest pain, palpitations, painful respirations Gastrointestinal:   Denies nausea, vomiting, diarrhea.  Endocrine:     Denies new hot or cold intolerance Musculoskeletal:  Denies joint swelling, gait issues, or new unexplained myalgias/ arthralgias Skin:  Denies rash, suspicious lesions  Neurological:    Denies dizziness, unexplained weakness, numbness  Psychiatric/Behavioral:   Denies mood changes  Objective:    Blood pressure (!) 152/80, pulse 64, temperature 97.7 F (36.5 C), height 5\' 2"  (1.575 m), weight 237 lb 11.2 oz (107.8 kg), SpO2 95 %.  Body mass index is 43.48 kg/m.  General: Well Developed, well nourished, and in no acute distress.  HEENT: Normocephalic, atraumatic, pupils equal round reactive to light, neck supple, No carotid bruits, no JVD Skin: Warm and dry, cap RF less 2 sec.  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 Cardiac: Regular rate and rhythm, S1, S2 WNL's, no murmurs rubs or gallops Respiratory: ECTA B/L, Not using accessory muscles, speaking in full sentences. NeuroM-Sk: Ambulates w/o assistance, moves ext * 4 w/o difficulty, sensation grossly intact.  Ext: scant edema b/l lower ext Psych: No HI/SI, judgement and insight good, Euthymic mood. Full Affect.

## 2018-06-05 NOTE — Patient Instructions (Signed)
+  check BP and bring in log next [place], please give them log  Your goal blood pressure should be around 140/90    Hypertension Hypertension, commonly called high blood pressure, is when the force of blood pumping through the arteries is too strong. The arteries are the blood vessels that carry blood from the heart throughout the body. Hypertension forces the heart to work harder to pump blood and may cause arteries to become narrow or stiff. Having untreated or uncontrolled hypertension can cause heart attacks, strokes, kidney disease, and other problems. A blood pressure reading consists of a higher number over a lower number. Ideally, your blood pressure should be below 120/80. The first ("top") number is called the systolic pressure. It is a measure of the pressure in your arteries as your heart beats. The second ("bottom") number is called the diastolic pressure. It is a measure of the pressure in your arteries as the heart relaxes. What are the causes? The cause of this condition is not known. What increases the risk? Some risk factors for high blood pressure are under your control. Others are not. Factors you can change  Smoking.  Having type 2 diabetes mellitus, high cholesterol, or both.  Not getting enough exercise or physical activity.  Being overweight.  Having too much fat, sugar, calories, or salt (sodium) in your diet.  Drinking too much alcohol. Factors that are difficult or impossible to change  Having chronic kidney disease.  Having a family history of high blood pressure.  Age. Risk increases with age.  Race. You may be at higher risk if you are African-American.  Gender. Men are at higher risk than women before age 11. After age 80, women are at higher risk than men.  Having obstructive sleep apnea.  Stress. What are the signs or symptoms? Extremely high blood pressure (hypertensive crisis) may cause:  Headache.  Anxiety.  Shortness of  breath.  Nosebleed.  Nausea and vomiting.  Severe chest pain.  Jerky movements you cannot control (seizures).  How is this diagnosed? This condition is diagnosed by measuring your blood pressure while you are seated, with your arm resting on a surface. The cuff of the blood pressure monitor will be placed directly against the skin of your upper arm at the level of your heart. It should be measured at least twice using the same arm. Certain conditions can cause a difference in blood pressure between your right and left arms. Certain factors can cause blood pressure readings to be lower or higher than normal (elevated) for a short period of time:  When your blood pressure is higher when you are in a health care provider's office than when you are at home, this is called white coat hypertension. Most people with this condition do not need medicines.  When your blood pressure is higher at home than when you are in a health care provider's office, this is called masked hypertension. Most people with this condition may need medicines to control blood pressure.  If you have a high blood pressure reading during one visit or you have normal blood pressure with other risk factors:  You may be asked to return on a different day to have your blood pressure checked again.  You may be asked to monitor your blood pressure at home for 1 week or longer.  If you are diagnosed with hypertension, you may have other blood or imaging tests to help your health care provider understand your overall risk for other  conditions. How is this treated? This condition is treated by making healthy lifestyle changes, such as eating healthy foods, exercising more, and reducing your alcohol intake. Your health care provider may prescribe medicine if lifestyle changes are not enough to get your blood pressure under control, and if:  Your systolic blood pressure is above 130.  Your diastolic blood pressure is above  80.  Your personal target blood pressure may vary depending on your medical conditions, your age, and other factors. Follow these instructions at home: Eating and drinking  Eat a diet that is high in fiber and potassium, and low in sodium, added sugar, and fat. An example eating plan is called the DASH (Dietary Approaches to Stop Hypertension) diet. To eat this way: ? Eat plenty of fresh fruits and vegetables. Try to fill half of your plate at each meal with fruits and vegetables. ? Eat whole grains, such as whole wheat pasta, brown rice, or whole grain bread. Fill about one quarter of your plate with whole grains. ? Eat or drink low-fat dairy products, such as skim milk or low-fat yogurt. ? Avoid fatty cuts of meat, processed or cured meats, and poultry with skin. Fill about one quarter of your plate with lean proteins, such as fish, chicken without skin, beans, eggs, and tofu. ? Avoid premade and processed foods. These tend to be higher in sodium, added sugar, and fat.  Reduce your daily sodium intake. Most people with hypertension should eat less than 1,500 mg of sodium a day.  Limit alcohol intake to no more than 1 drink a day for nonpregnant women and 2 drinks a day for men. One drink equals 12 oz of beer, 5 oz of wine, or 1 oz of hard liquor. Lifestyle  Work with your health care provider to maintain a healthy body weight or to lose weight. Ask what an ideal weight is for you.  Get at least 30 minutes of exercise that causes your heart to beat faster (aerobic exercise) most days of the week. Activities may include walking, swimming, or biking.  Include exercise to strengthen your muscles (resistance exercise), such as pilates or lifting weights, as part of your weekly exercise routine. Try to do these types of exercises for 30 minutes at least 3 days a week.  Do not use any products that contain nicotine or tobacco, such as cigarettes and e-cigarettes. If you need help quitting, ask  your health care provider.  Monitor your blood pressure at home as told by your health care provider.  Keep all follow-up visits as told by your health care provider. This is important. Medicines  Take over-the-counter and prescription medicines only as told by your health care provider. Follow directions carefully. Blood pressure medicines must be taken as prescribed.  Do not skip doses of blood pressure medicine. Doing this puts you at risk for problems and can make the medicine less effective.  Ask your health care provider about side effects or reactions to medicines that you should watch for. Contact a health care provider if:  You think you are having a reaction to a medicine you are taking.  You have headaches that keep coming back (recurring).  You feel dizzy.  You have swelling in your ankles.  You have trouble with your vision. Get help right away if:  You develop a severe headache or confusion.  You have unusual weakness or numbness.  You feel faint.  You have severe pain in your chest or abdomen.  You vomit  repeatedly.  You have trouble breathing. Summary  Hypertension is when the force of blood pumping through your arteries is too strong. If this condition is not controlled, it may put you at risk for serious complications.  Your personal target blood pressure may vary depending on your medical conditions, your age, and other factors. For most people, a normal blood pressure is less than 120/80.  Hypertension is treated with lifestyle changes, medicines, or a combination of both. Lifestyle changes include weight loss, eating a healthy, low-sodium diet, exercising more, and limiting alcohol. This information is not intended to replace advice given to you by your health care provider. Make sure you discuss any questions you have with your health care provider. Document Released: 05/01/2005 Document Revised: 03/29/2016 Document Reviewed: 03/29/2016 Elsevier  Interactive Patient Education  2018 [employer].    How to Take Your Blood Pressure   Blood pressure is a measurement of how strongly your blood is pressing against the walls of your arteries. Arteries are blood vessels that carry blood from your heart throughout your body. Your health care provider takes your blood pressure at each office visit. You can also take your own blood pressure at home with a blood pressure machine. You may need to take your own blood pressure:  To confirm a diagnosis of high blood pressure (hypertension).  To monitor your blood pressure over time.  To make sure your blood pressure medicine is working.  Supplies needed: To take your blood pressure, you will need a blood pressure machine. You can buy a blood pressure machine, or blood pressure monitor, at most drugstores or online. There are several types of home blood pressure monitors. When choosing one, consider the following:  Choose a monitor that has an arm cuff.  Choose a monitor that wraps snugly around your upper arm. You should be able to fit only one finger between your arm and the cuff.  Do not choose a monitor that measures your blood pressure from your wrist or finger.  Your health care provider can suggest a reliable monitor that will meet your needs. How to prepare To get the most accurate reading, avoid the following for 30 minutes before you check your blood pressure:  Drinking caffeine.  Drinking alcohol.  Eating.  Smoking.  Exercising.  Five minutes before you check your blood pressure:  Empty your bladder.  Sit quietly without talking in a dining chair, rather than in a soft couch or armchair.  How to take your blood pressure To check your blood pressure, follow the instructions in the manual that came with your blood pressure monitor. If you have a digital blood pressure monitor, the instructions may be as follows: 1. Sit up straight. 2. Place your feet on the floor. Do  not cross your ankles or legs. 3. Rest your left arm at the level of your heart on a table or desk or on the arm of a chair. 4. Pull up your shirt sleeve. 5. Wrap the blood pressure cuff around the upper part of your left arm, 1 inch (2.5 cm) above your elbow. It is best to wrap the cuff around bare skin. 6. Fit the cuff snugly around your arm. You should be able to place only one finger between the cuff and your arm. 7. Position the cord inside the groove of your elbow. 8. Press the power button. 9. Sit quietly while the cuff inflates and deflates. 10. Read the digital reading on the monitor screen and write it down (  record it). 11. Wait 2-3 minutes, then repeat the steps, starting at step 1.  What does my blood pressure reading mean? A blood pressure reading consists of a higher number over a lower number. Ideally, your blood pressure should be below 120/80. The first ("top") number is called the systolic pressure. It is a measure of the pressure in your arteries as your heart beats. The second ("bottom") number is called the diastolic pressure. It is a measure of the pressure in your arteries as the heart relaxes. Blood pressure is classified into four stages. The following are the stages for adults who do not have a short-term serious illness or a chronic condition. Systolic pressure and diastolic pressure are measured in a unit called mm Hg. Normal  Systolic pressure: below 322.  Diastolic pressure: below 80. Elevated  Systolic pressure: 025-427.  Diastolic pressure: below 80. Hypertension stage 1  Systolic pressure: 062-376.  Diastolic pressure: 28-31. Hypertension stage 2  Systolic pressure: 517 or above.  Diastolic pressure: 90 or above. You can have prehypertension or hypertension even if only the systolic or only the diastolic number in your reading is higher than normal. Follow these instructions at home:  Check your blood pressure as often as recommended by your  health care provider.  Take your monitor to the next appointment with your health care provider to make sure: ? That you are using it correctly. ? That it provides accurate readings.  Be sure you understand what your goal blood pressure numbers are.  Tell your health care provider if you are having any side effects from blood pressure medicine. Contact a health care provider if:  Your blood pressure is consistently high. Get help right away if:  Your systolic blood pressure is higher than 180.  Your diastolic blood pressure is higher than 110. This information is not intended to replace advice given to you by your health care provider. Make sure you discuss any questions you have with your health care provider. Document Released: 10/08/2015 Document Revised: 12/21/2015 Document Reviewed: 10/08/2015 Elsevier Interactive Patient Education  [employer].

## 2018-06-06 LAB — LIPID PANEL
CHOL/HDL RATIO: 2.8 ratio (ref 0.0–4.4)
Cholesterol, Total: 127 mg/dL (ref 100–199)
HDL: 46 mg/dL (ref >39)
LDL Calculated: 63 mg/dL (ref 0–99)
Triglycerides: 90 mg/dL (ref 0–149)
VLDL Cholesterol Cal: 18 mg/dL (ref 5–40)

## 2018-06-06 LAB — CBC WITH DIFFERENTIAL/PLATELET
Basophils Absolute: 0 10*3/uL (ref 0.0–0.2)
Basos: 0 %
EOS (ABSOLUTE): 0.2 10*3/uL (ref 0.0–0.4)
Eos: 3 %
HEMOGLOBIN: 14 g/dL (ref 11.1–15.9)
Hematocrit: 40.5 % (ref 34.0–46.6)
Immature Grans (Abs): 0 10*3/uL (ref 0.0–0.1)
Immature Granulocytes: 0 %
Lymphocytes Absolute: 2.5 10*3/uL (ref 0.7–3.1)
Lymphs: 40 %
MCH: 31.8 pg (ref 26.6–33.0)
MCHC: 34.6 g/dL (ref 31.5–35.7)
MCV: 92 fL (ref 79–97)
Monocytes Absolute: 0.6 10*3/uL (ref 0.1–0.9)
Monocytes: 10 %
Neutrophils Absolute: 2.9 10*3/uL (ref 1.4–7.0)
Neutrophils: 47 %
Platelets: 158 10*3/uL (ref 150–450)
RBC: 4.4 x10E6/uL (ref 3.77–5.28)
RDW: 12.5 % (ref 11.7–15.4)
WBC: 6.2 10*3/uL (ref 3.4–10.8)

## 2018-06-06 LAB — COMPREHENSIVE METABOLIC PANEL
ALBUMIN: 4 g/dL (ref 3.7–4.7)
ALT: 37 IU/L — ABNORMAL HIGH (ref 0–32)
AST: 33 IU/L (ref 0–40)
Albumin/Globulin Ratio: 1.3 (ref 1.2–2.2)
Alkaline Phosphatase: 75 IU/L (ref 39–117)
BUN / CREAT RATIO: 10 — AB (ref 12–28)
BUN: 8 mg/dL (ref 8–27)
Bilirubin Total: 0.7 mg/dL (ref 0.0–1.2)
CO2: 26 mmol/L (ref 20–29)
Calcium: 9.4 mg/dL (ref 8.7–10.3)
Chloride: 104 mmol/L (ref 96–106)
Creatinine, Ser: 0.79 mg/dL (ref 0.57–1.00)
GFR calc Af Amer: 84 mL/min/{1.73_m2} (ref >59)
GFR calc non Af Amer: 72 mL/min/{1.73_m2} (ref >59)
Globulin, Total: 3.1 g/dL (ref 1.5–4.5)
Glucose: 103 mg/dL — ABNORMAL HIGH (ref 65–99)
Potassium: 4.4 mmol/L (ref 3.5–5.2)
Sodium: 145 mmol/L — ABNORMAL HIGH (ref 134–144)
Total Protein: 7.1 g/dL (ref 6.0–8.5)

## 2018-06-06 LAB — TSH: TSH: 1.02 u[IU]/mL (ref 0.450–4.500)

## 2018-06-06 LAB — T4, FREE: Free T4: 1.05 ng/dL (ref 0.82–1.77)

## 2018-06-06 LAB — VITAMIN D 25 HYDROXY (VIT D DEFICIENCY, FRACTURES): Vit D, 25-Hydroxy: 26.9 ng/mL — ABNORMAL LOW (ref 30.0–100.0)

## 2018-06-06 LAB — VITAMIN B12: Vitamin B-12: 393 pg/mL (ref 232–1245)

## 2018-06-21 DIAGNOSIS — M7051 Other bursitis of knee, right knee: Secondary | ICD-10-CM | POA: Diagnosis not present

## 2018-06-21 DIAGNOSIS — Z96653 Presence of artificial knee joint, bilateral: Secondary | ICD-10-CM | POA: Diagnosis not present

## 2018-06-21 DIAGNOSIS — M545 Low back pain: Secondary | ICD-10-CM | POA: Diagnosis not present

## 2018-06-21 DIAGNOSIS — M7050 Other bursitis of knee, unspecified knee: Secondary | ICD-10-CM | POA: Insufficient documentation

## 2018-07-10 ENCOUNTER — Ambulatory Visit (INDEPENDENT_AMBULATORY_CARE_PROVIDER_SITE_OTHER): Payer: Medicare Other | Admitting: Family Medicine

## 2018-07-10 ENCOUNTER — Encounter: Payer: Self-pay | Admitting: Family Medicine

## 2018-07-10 VITALS — BP 142/82 | HR 60 | Temp 98.2°F | Ht 62.0 in | Wt 236.6 lb

## 2018-07-10 DIAGNOSIS — I10 Essential (primary) hypertension: Secondary | ICD-10-CM | POA: Diagnosis not present

## 2018-07-10 MED ORDER — CARVEDILOL 6.25 MG PO TABS: 6.2500 mg | ORAL_TABLET | Freq: Two times a day (BID) | ORAL | 0 refills | Status: DC

## 2018-07-10 MED ORDER — TRIAMTERENE-HCTZ 37.5-25 MG PO TABS: 1.0000 | ORAL_TABLET | Freq: Every day | ORAL | 0 refills | Status: DC

## 2018-07-10 NOTE — Progress Notes (Signed)
Impression and Recommendations:    1. Essential hypertension     1. Rosacea & Skin Concerns (Need for Removal of Skin Cancer) - Per patient, her rosacea has not recently improved on metronidazole cream.   - Per patient, has an appointment with dermatology coming up around March 28th. - Patient knows to follow treatment plan as prescribed by dermatology.  Need for Removal of Skin Cancer - Patient has made an appointment for follow-up with [facility]. - Patient knows to let us know if she needs referral elsewhere.  - Reviewed the A,B,C,D,E's of skin surveillance in office during appointment today.  2. Chronic Knee & LE Nerve Pain - Advised patient to continue to follow up with Dr. Wynelle Link as recommended. - Patient knows to call Dr. Wynelle Link and ask whether she can obtain cortisone shots every 4 months, three times per year.  - Will continue to monitor.  3. GERD/Acid Reflux - Stable at this time. - Per patient, only uses Tums for heartburn once per month.  - Advised patient to use Tums sparingly and only take lowest dose of omeprazole.  See med list. - Advised patient that if she is not feeling bad heartburn, she should not take a pill.  - Will continue to monitor.  4. Hypertension - BP Elevated on Intake Today - Last visit, lisinopril was cut in half and coreg was added. - Discontinue lisinopril due to side effect of cough. - Reviewed that patient's cough should completely go away after lisinopril is discontinued.  - Per patient, BP running 140-150 at home, with 151 being highest. - Suboptimally managed today.   - Recommended change in treatment plan.  - Begin Maxzide today.  Continue coreg as recommended.  See med list.  - Lifestyle changes such as dash diet and engaging in a regular exercise program discussed with patient.  Educational handouts provided.  - Ambulatory BP monitoring encouraged. Keep log and bring in next OV.  - Will continue to  monitor.  5. BMI Counseling - BMI of 43.27 Explained to patient what BMI refers to, and what it means medically.    Told patient to think about it as a "medical risk stratification measurement" and how increasing BMI is associated with increasing risk/ or worsening state of various diseases such as hypertension, hyperlipidemia, diabetes, premature OA, depression etc.  American Heart Association guidelines for healthy diet, basically Mediterranean diet, and exercise guidelines of 30 minutes 5 days per week or more discussed in detail.  Health counseling performed.  All questions answered.  6. Lifestyle & Preventative Health Maintenance - Advised patient to continue working toward exercising to improve overall mental, physical, and emotional health.    - Encouraged patient to engage in daily physical activity, especially a formal exercise routine.  Recommended that the patient eventually strive for at least 150 minutes of moderate cardiovascular activity per week according to guidelines established by the [facility].   - Healthy dietary habits encouraged, including low-carb, and high amounts of lean protein in diet.   - Patient should also consume adequate amounts of water.   Education and routine counseling performed. Handouts provided.   Meds ordered this encounter  Medications  . triamterene-hydrochlorothiazide (MAXZIDE-25) 37.5-25 MG tablet    Sig: Take 1 tablet by mouth daily.    Dispense:  90 tablet    Refill:  0  . carvedilol (COREG) 6.25 MG tablet    Sig: Take 1 tablet (6.25 mg total) by mouth 2 (two) times daily  with a meal.    Dispense:  180 tablet    Refill:  0    Medications Discontinued During This Encounter  Medication Reason  . lisinopril (PRINIVIL,ZESTRIL) 10 MG tablet Change in therapy  . chlorthalidone (HYGROTON) 25 MG tablet   . carvedilol (COREG) 6.25 MG tablet Reorder     The patient was counseled, risk factors were discussed, anticipatory guidance given.  Gross  side effects, risk and benefits, and alternatives of medications discussed with patient.  Patient is aware that all medications have potential side effects and we are unable to predict every side effect or drug-drug interaction that may occur.  Expresses verbal understanding and consents to current therapy plan and treatment regimen.  Return for 2) 2 mo- started new bp med- maxide, d/c lisinopril.  Please see AVS handed out to patient at the end of our visit for further patient instructions/ counseling done pertaining to today's office visit.    Note:  This document was prepared using Dragon voice recognition software and may include unintentional dictation errors.  This document serves as a record of services personally performed by Mellody Dance, DO. It was created on her behalf by [name], a trained medical scribe. The creation of this record is based on the scribe's personal observations and the provider's statements to them.   I have reviewed the above medical documentation for accuracy and completeness and I concur.  Mellody Dance, DO 07/12/2018 5:25 PM       Subjective:    HPI: Kimberly Ferrell is a 78 y.o. female who presents to [facility] today for follow up of [place].    Intermittent Acid Reflux Feels her heartburn is better.  "If it really bothers me, I usually go get a Tums."  Feels she only takes a Tums about once per month.  Notes she has taken some omeprazole, but "only when it's really really bad."  Rosacea Patient feels concerned about her rosacea.  Says she washes her face at night and applies metronidazole cream at night, to no relief.  She has an appointment with dermatology coming up around March 28th.  Chronic Knee & LE Nerve Pain Patient has a hard time sleeping due to her leg nerve pain.  She was placed on tramadol, and was told ot elevate her legs using pillows.  Notes "I've tried both of those and it  hasn't helped."  Had a cortisone shot with Dr. Wynelle Link.  Says that the shot lasts for about two months, and then goes away.  Notes she sleeps better in the recliner than in the bed.  HTN: Last visit, lisinopril was cut in half and coreg was added to treatment plan.  Patient notes that she still coughs some, "but nothing like I did."  -  Her blood pressure has been controlled at home, in patient's opinion.  Pt has been checking it regularly.  Feels it usually runs anywhere from about 140-150 at home, and highest it's been is 151.  Per patient, "it seems to be working good."  Has been exercising some, but not much.  Says her legs start swelling "and they burn and sting."  - Patient reports good compliance with blood pressure medications  - Denies medication S-E.    - Smoking Status noted   - She denies new onset of: chest pain, exercise intolerance, shortness of breath, dizziness, visual changes, headache, lower extremity swelling or claudication. Denies heart palpitations.  Last 3 blood pressure readings  in our office are as follows: BP Readings from Last 3 Encounters:  07/10/18 (!) 142/82  06/05/18 (!) 152/80  05/01/18 129/85    Pulse Readings from Last 3 Encounters:  07/10/18 60  06/05/18 64  05/01/18 68    Filed Weights   07/10/18 1422  Weight: 236 lb 9.6 oz (107.3 kg)      Patient Care Team    Relationship Specialty Notifications Start End  Mellody Dance, DO PCP - General Family Medicine  01/31/16   Gaynelle Arabian, MD Consulting Physician Orthopedic Surgery  01/31/16   Druscilla Brownie, MD Consulting Physician Dermatology  01/31/16   Anastasio Auerbach, MD Consulting Physician Gynecology  02/06/16   Pieter Partridge, DO Consulting Physician Neurology  11/02/16      Lab Results  Component Value Date   CREATININE 0.79 06/05/2018   BUN 8 06/05/2018   NA 145 (H) 06/05/2018   K 4.4 06/05/2018   CL 104 06/05/2018   CO2 26 06/05/2018    Lab Results  Component  Value Date   CHOL 127 06/05/2018   CHOL 122 11/10/2016   CHOL 136 04/16/2013    Lab Results  Component Value Date   HDL 46 06/05/2018   HDL 40 11/10/2016   HDL 33.30 (L) 04/16/2013    Lab Results  Component Value Date   LDLCALC 63 06/05/2018   LDLCALC 62 11/10/2016   [place]jo 80 04/16/2013    Lab Results  Component Value Date   TRIG 90 06/05/2018   TRIG 98 11/10/2016   TRIG 116.0 04/16/2013    Lab Results  Component Value Date   CHOLHDL 2.8 06/05/2018   CHOLHDL 3.1 11/10/2016   CHOLHDL 4 04/16/2013    No results found for: LDLDIRECT ===================================================================   Patient Active Problem List   Diagnosis Date Noted  . BMI 40.0-44.9, adult ([place]) 11/17/2016    Priority: High  . HTN (hypertension) 06/15/2011    Priority: High  . Glucose intolerance (impaired glucose tolerance) : A1c elevated 11/17/2016    Priority: Medium  . TIA (transient ischemic attack) 01/31/2016    Priority: Medium  . Vitamin D deficiency 11/17/2016    Priority: Low  . GERD (gastroesophageal reflux disease) 11/02/2016    Priority: Low  . OA (osteoarthritis) of knee 05/23/2013    Priority: Low  . Myalgia and myositis 01/30/2012    Priority: Low  . Pes anserinus bursitis 06/21/2018  . Fatigue 06/05/2018  . Abnormal urinalysis 04/15/2018  . Medication course changed 03/05/2018  . 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[place]) 01/31/2018  . Syncope 01/31/2018  . 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 Elevated ALT measurement 12/03/2017  . Gassiness/ belching 06/20/2017  . Muscle spasm of back 05/21/2017  . Obesity, Class III, BMI 40-49.9 (morbid obesity) ([place]) 03/06/2017  . Pre-operative examination 07/31/2016  . Vaginal mass 07/23/2014  . OAB (overactive bladder) 04/16/2013  . Acute bronchitis 04/16/2013  . Post-nasal drip 05/30/2012  . Finger injury 05/30/2012  . Lung nodule seen on imaging study 01/23/2012  . Flank pain  01/22/2012  . Radicular low back pain 01/22/2012  . Rosacea 06/25/2011  . Back pain 06/15/2011  . Urinary frequency 06/15/2011     Past Medical History:  Diagnosis Date  . Arthritis   . Cancer ([place])    skin cancer removed from right forehead  . Chicken pox as child  . Colon polyps   . Complication of anesthesia    "hard to wake up"  . GERD (  gastroesophageal reflux disease)    occasional  . Hemorrhoids    "sometimes"  . Hypertension   . Lung nodule seen on imaging study    noted 8/13- needs CT f/u in 1 year  . Mononucleosis   . Pneumonia    hx of  . PONV (postoperative nausea and vomiting)   . Rosacea   . TIA (transient ischemic attack) 2007   from IV dye, no residual, passed out and was told by RN that it was TIA     Past Surgical History:  Procedure Laterality Date  . ABDOMINAL HYSTERECTOMY  1993  . BREAST BIOPSY Left 2004   [place]   . CHOLECYSTECTOMY  1992  . HEMORRHOID SURGERY  1960's  . PARTIAL KNEE ARTHROPLASTY Right 08/09/2016   Procedure: RIGHT UNICOMPARTMENTAL KNEE;  Surgeon: Gaynelle Arabian, MD;  Location: [facility];  Service: Orthopedics;  Laterality: Right;  requests 1hr; Adductor Block  . TOTAL KNEE ARTHROPLASTY Left 05/23/2013   Procedure: LEFT TOTAL KNEE ARTHROPLASTY;  Surgeon: Gearlean Alf, MD;  Location: [facility];  Service: Orthopedics;  Laterality: Left;     Family History  Problem Relation Age of Onset  . Cancer Mother 78       colon  . Sudden death Father 73       suicide  . Stroke Maternal Grandmother 78  . Hypertension Neg Hx   . Hyperlipidemia Neg Hx   . Heart attack Neg Hx   . Diabetes Neg Hx   . Breast cancer Neg Hx      Social History   Substance and Sexual Activity  Drug Use No  ,  Social History   Substance and Sexual Activity  Alcohol Use No  ,  Social History   Tobacco Use  Smoking Status Never Smoker  Smokeless Tobacco Never Used  ,    Current Outpatient Medications on File Prior to Visit  Medication Sig  Dispense Refill  . aspirin EC 81 MG tablet Take 81 mg by mouth daily.    . metroNIDAZOLE (METROCREAM) 0.75 % cream Apply 1 application topically 2 (two) times daily.    . Vitamin D, Ergocalciferol, (DRISDOL) 50000 units CAPS capsule Take 1 capsule (50,000 Units total) by mouth 2 (two) times a week. 24 capsule 3  . omeprazole (PRILOSEC) 20 MG capsule Take 1 capsule (20 mg total) by mouth 2 (two) times daily before a meal. 2wks, then 1 po qd thereafter 104 capsule 1   No current facility-administered medications on file prior to visit.      Allergies  Allergen Reactions  . Other Other (See Comments)    IV dye, heart stopped  . Amlodipine Nausea And Vomiting    Dizziness  . Lisinopril Cough  . Valsartan Rash     Review of Systems:   General:  Denies fever, chills Optho/Auditory:   Denies visual changes, blurred vision Respiratory:   Denies SOB, cough, wheeze, DIB  Cardiovascular:   Denies chest pain, palpitations, painful respirations Gastrointestinal:   Denies nausea, vomiting, diarrhea.  Endocrine:     Denies new hot or cold intolerance Musculoskeletal:  Denies joint swelling, gait issues, or new unexplained myalgias/ arthralgias Skin:  Denies rash, suspicious lesions  Neurological:    Denies dizziness, unexplained weakness, numbness  Psychiatric/Behavioral:   Denies mood changes  Objective:    Blood pressure (!) 142/82, pulse 60, temperature 98.2 F (36.8 C), height 5\' 2"  (1.575 m), weight 236 lb 9.6 oz (107.3 kg), SpO2 98 %.  Body mass  index is 43.27 kg/m.  General: Well Developed, well nourished, and in no acute distress.  HEENT: Normocephalic, atraumatic, pupils equal round reactive to light, neck supple, No carotid bruits, no JVD Skin: Warm and dry, cap RF less 2 sec Cardiac: Regular rate and rhythm, S1, S2 WNL's, no murmurs rubs or gallops Respiratory: ECTA B/L, Not using accessory muscles, speaking in full sentences. NeuroM-Sk: Ambulates w/o assistance, moves ext *  4 w/o difficulty, sensation grossly intact.  Ext: scant edema b/l lower ext Psych: No HI/SI, judgement and insight good, Euthymic mood. Full Affect.

## 2018-07-10 NOTE — Patient Instructions (Signed)
Stop lisinopril due to cough s-e.  Melissa please put this in her chart as an allergy/ intolerance  - Continue Coreg/ Carvedilol;  Add Maxide  - monitor BP's at home and bring in log next office visit.   - please try to walk everyday a little and work your way up to 30-60 min/d     Hypertension Hypertension, commonly called high blood pressure, is when the force of blood pumping through the arteries is too strong. The arteries are the blood vessels that carry blood from the heart throughout the body. Hypertension forces the heart to work harder to pump blood and may cause arteries to become narrow or stiff. Having untreated or uncontrolled hypertension can cause heart attacks, strokes, kidney disease, and other problems. A blood pressure reading consists of a higher number over a lower number. Ideally, your blood pressure should be below 120/80. The first ("top") number is called the systolic pressure. It is a measure of the pressure in your arteries as your heart beats. The second ("bottom") number is called the diastolic pressure. It is a measure of the pressure in your arteries as the heart relaxes. What are the causes? The cause of this condition is not known. What increases the risk? Some risk factors for high blood pressure are under your control. Others are not. Factors you can change  Smoking.  Having type 2 diabetes mellitus, high cholesterol, or both.  Not getting enough exercise or physical activity.  Being overweight.  Having too much fat, sugar, calories, or salt (sodium) in your diet.  Drinking too much alcohol. Factors that are difficult or impossible to change  Having chronic kidney disease.  Having a family history of high blood pressure.  Age. Risk increases with age.  Race. You may be at higher risk if you are African-American.  Gender. Men are at higher risk than women before age 31. After age 45, women are at higher risk than men.  Having obstructive  sleep apnea.  Stress. What are the signs or symptoms? Extremely high blood pressure (hypertensive crisis) may cause:  Headache.  Anxiety.  Shortness of breath.  Nosebleed.  Nausea and vomiting.  Severe chest pain.  Jerky movements you cannot control (seizures).  How is this diagnosed? This condition is diagnosed by measuring your blood pressure while you are seated, with your arm resting on a surface. The cuff of the blood pressure monitor will be placed directly against the skin of your upper arm at the level of your heart. It should be measured at least twice using the same arm. Certain conditions can cause a difference in blood pressure between your right and left arms. Certain factors can cause blood pressure readings to be lower or higher than normal (elevated) for a short period of time:  When your blood pressure is higher when you are in a health care provider's office than when you are at home, this is called white coat hypertension. Most people with this condition do not need medicines.  When your blood pressure is higher at home than when you are in a health care provider's office, this is called masked hypertension. Most people with this condition may need medicines to control blood pressure.  If you have a high blood pressure reading during one visit or you have normal blood pressure with other risk factors:  You may be asked to return on a different day to have your blood pressure checked again.  You may be asked to monitor your blood pressure  at home for 1 week or longer.  If you are diagnosed with hypertension, you may have other blood or imaging tests to help your health care provider understand your overall risk for other conditions. How is this treated? This condition is treated by making healthy lifestyle changes, such as eating healthy foods, exercising more, and reducing your alcohol intake. Your health care provider may prescribe medicine if lifestyle changes  are not enough to get your blood pressure under control, and if:  Your systolic blood pressure is above 130.  Your diastolic blood pressure is above 80.  Your personal target blood pressure may vary depending on your medical conditions, your age, and other factors. Follow these instructions at home: Eating and drinking  Eat a diet that is high in fiber and potassium, and low in sodium, added sugar, and fat. An example eating plan is called the DASH (Dietary Approaches to Stop Hypertension) diet. To eat this way: ? Eat plenty of fresh fruits and vegetables. Try to fill half of your plate at each meal with fruits and vegetables. ? Eat whole grains, such as whole wheat pasta, brown rice, or whole grain bread. Fill about one quarter of your plate with whole grains. ? Eat or drink low-fat dairy products, such as skim milk or low-fat yogurt. ? Avoid fatty cuts of meat, processed or cured meats, and poultry with skin. Fill about one quarter of your plate with lean proteins, such as fish, chicken without skin, beans, eggs, and tofu. ? Avoid premade and processed foods. These tend to be higher in sodium, added sugar, and fat.  Reduce your daily sodium intake. Most people with hypertension should eat less than 1,500 mg of sodium a day.  Limit alcohol intake to no more than 1 drink a day for nonpregnant women and 2 drinks a day for men. One drink equals 12 oz of beer, 5 oz of wine, or 1 oz of hard liquor. Lifestyle  Work with your health care provider to maintain a healthy body weight or to lose weight. Ask what an ideal weight is for you.  Get at least 30 minutes of exercise that causes your heart to beat faster (aerobic exercise) most days of the week. Activities may include walking, swimming, or biking.  Include exercise to strengthen your muscles (resistance exercise), such as pilates or lifting weights, as part of your weekly exercise routine. Try to do these types of exercises for 30 minutes at  least 3 days a week.  Do not use any products that contain nicotine or tobacco, such as cigarettes and e-cigarettes. If you need help quitting, ask your health care provider.  Monitor your blood pressure at home as told by your health care provider.  Keep all follow-up visits as told by your health care provider. This is important. Medicines  Take over-the-counter and prescription medicines only as told by your health care provider. Follow directions carefully. Blood pressure medicines must be taken as prescribed.  Do not skip doses of blood pressure medicine. Doing this puts you at risk for problems and can make the medicine less effective.  Ask your health care provider about side effects or reactions to medicines that you should watch for. Contact a health care provider if:  You think you are having a reaction to a medicine you are taking.  You have headaches that keep coming back (recurring).  You feel dizzy.  You have swelling in your ankles.  You have trouble with your vision. Get help right  away if:  You develop a severe headache or confusion.  You have unusual weakness or numbness.  You feel faint.  You have severe pain in your chest or abdomen.  You vomit repeatedly.  You have trouble breathing. Summary  Hypertension is when the force of blood pumping through your arteries is too strong. If this condition is not controlled, it may put you at risk for serious complications.  Your personal target blood pressure may vary depending on your medical conditions, your age, and other factors. For most people, a normal blood pressure is less than 120/80.  Hypertension is treated with lifestyle changes, medicines, or a combination of both. Lifestyle changes include weight loss, eating a healthy, low-sodium diet, exercising more, and limiting alcohol. This information is not intended to replace advice given to you by your health care provider. Make sure you discuss any  questions you have with your health care provider. Document Released: 05/01/2005 Document Revised: 03/29/2016 Document Reviewed: 03/29/2016 Elsevier Interactive Patient Education  2018 [employer].    How to Take Your Blood Pressure   Blood pressure is a measurement of how strongly your blood is pressing against the walls of your arteries. Arteries are blood vessels that carry blood from your heart throughout your body. Your health care provider takes your blood pressure at each office visit. You can also take your own blood pressure at home with a blood pressure machine. You may need to take your own blood pressure:  To confirm a diagnosis of high blood pressure (hypertension).  To monitor your blood pressure over time.  To make sure your blood pressure medicine is working.  Supplies needed: To take your blood pressure, you will need a blood pressure machine. You can buy a blood pressure machine, or blood pressure monitor, at most drugstores or online. There are several types of home blood pressure monitors. When choosing one, consider the following:  Choose a monitor that has an arm cuff.  Choose a monitor that wraps snugly around your upper arm. You should be able to fit only one finger between your arm and the cuff.  Do not choose a monitor that measures your blood pressure from your wrist or finger.  Your health care provider can suggest a reliable monitor that will meet your needs. How to prepare To get the most accurate reading, avoid the following for 30 minutes before you check your blood pressure:  Drinking caffeine.  Drinking alcohol.  Eating.  Smoking.  Exercising.  Five minutes before you check your blood pressure:  Empty your bladder.  Sit quietly without talking in a dining chair, rather than in a soft couch or armchair.  How to take your blood pressure To check your blood pressure, follow the instructions in the manual that came with your blood  pressure monitor. If you have a digital blood pressure monitor, the instructions may be as follows: 1. Sit up straight. 2. Place your feet on the floor. Do not cross your ankles or legs. 3. Rest your left arm at the level of your heart on a table or desk or on the arm of a chair. 4. Pull up your shirt sleeve. 5. Wrap the blood pressure cuff around the upper part of your left arm, 1 inch (2.5 cm) above your elbow. It is best to wrap the cuff around bare skin. 6. Fit the cuff snugly around your arm. You should be able to place only one finger between the cuff and your arm. 7. Position the  cord inside the groove of your elbow. 8. Press the power button. 9. Sit quietly while the cuff inflates and deflates. 10. Read the digital reading on the monitor screen and write it down (record it). 11. Wait 2-3 minutes, then repeat the steps, starting at step 1.  What does my blood pressure reading mean? A blood pressure reading consists of a higher number over a lower number. Ideally, your blood pressure should be below 120/80. The first ("top") number is called the systolic pressure. It is a measure of the pressure in your arteries as your heart beats. The second ("bottom") number is called the diastolic pressure. It is a measure of the pressure in your arteries as the heart relaxes. Blood pressure is classified into four stages. The following are the stages for adults who do not have a short-term serious illness or a chronic condition. Systolic pressure and diastolic pressure are measured in a unit called mm Hg. Normal  Systolic pressure: below 662.  Diastolic pressure: below 80. Elevated  Systolic pressure: 947-654.  Diastolic pressure: below 80. Hypertension stage 1  Systolic pressure: 650-354.  Diastolic pressure: 65-68. Hypertension stage 2  Systolic pressure: 127 or above.  Diastolic pressure: 90 or above. You can have prehypertension or hypertension even if only the systolic or only the  diastolic number in your reading is higher than normal. Follow these instructions at home:  Check your blood pressure as often as recommended by your health care provider.  Take your monitor to the next appointment with your health care provider to make sure: ? That you are using it correctly. ? That it provides accurate readings.  Be sure you understand what your goal blood pressure numbers are.  Tell your health care provider if you are having any side effects from blood pressure medicine. Contact a health care provider if:  Your blood pressure is consistently high. Get help right away if:  Your systolic blood pressure is higher than 180.  Your diastolic blood pressure is higher than 110. This information is not intended to replace advice given to you by your health care provider. Make sure you discuss any questions you have with your health care provider. Document Released: 10/08/2015 Document Revised: 12/21/2015 Document Reviewed: 10/08/2015 Elsevier Interactive Patient Education  [employer].

## 2018-09-09 ENCOUNTER — Other Ambulatory Visit: Payer: Self-pay

## 2018-09-09 ENCOUNTER — Ambulatory Visit (INDEPENDENT_AMBULATORY_CARE_PROVIDER_SITE_OTHER): Payer: Medicare Other | Admitting: Family Medicine

## 2018-09-09 ENCOUNTER — Encounter: Payer: Self-pay | Admitting: Family Medicine

## 2018-09-09 VITALS — BP 141/54 | HR 70 | Ht 62.0 in | Wt 230.0 lb

## 2018-09-09 DIAGNOSIS — K219 Gastro-esophageal reflux disease without esophagitis: Secondary | ICD-10-CM

## 2018-09-09 DIAGNOSIS — R0981 Nasal congestion: Secondary | ICD-10-CM | POA: Insufficient documentation

## 2018-09-09 DIAGNOSIS — R0982 Postnasal drip: Secondary | ICD-10-CM

## 2018-09-09 DIAGNOSIS — R053 Chronic cough: Secondary | ICD-10-CM | POA: Insufficient documentation

## 2018-09-09 DIAGNOSIS — R05 Cough: Secondary | ICD-10-CM | POA: Diagnosis not present

## 2018-09-09 DIAGNOSIS — I10 Essential (primary) hypertension: Secondary | ICD-10-CM

## 2018-09-09 MED ORDER — OMEPRAZOLE 20 MG PO CPDR: 20.0000 mg | DELAYED_RELEASE_CAPSULE | Freq: Two times a day (BID) | ORAL | 0 refills | Status: DC

## 2018-09-09 NOTE — Progress Notes (Signed)
Telehealth office visit note for Kimberly Ferrell, D.O- at [facility]   I connected with current patient today and verified that I am speaking with the correct person using two identifiers.   . Location of the patient: Home . Location of the provider: Office Only the patient (+/- their family members at pt's discretion) and myself were participating in the encounter    - This visit type was conducted due to national recommendations for restrictions regarding the COVID-19 Pandemic (e.g. social distancing) in an effort to limit this patient's exposure and mitigate transmission in our community.  This format is felt to be most appropriate for this patient at this time.   - The patient did not have access to video technology or had technical difficulties with video requiring transitioning to audio format only. - No physical exam could be performed with this format, beyond that communicated to [place] by the patient/ family members as noted.   - Additionally my office staff/ schedulers discussed with the patient that there may be a monetary charge related to this service, depending on their medical insurance.   The patient expressed understanding, and agreed to proceed.       History of Present Illness:  Last office visit saw patient on 07/10/2018.  At that time she had her continued sx of chronic cough and since we presumed it was from lisinopril, we discontinued lisinopril and begin Maxide in addition to her Coreg.  She is here for follow-up to discuss her blood pressure and how it is been running since then   HTN:  Bp at home 147/66 p 70;  151/69, P66  - highest 157/72 and lowest around lower 140s/60s.   No change in BP's at home really, BUT No improvement in cough since discontinuing her lisinopril next office visit.  Patient has no symptoms of dizziness, headache, visual changes etc. Today.  She is tolerating the Maxide that we started her on last time okay.  No side effects.   Coughing- seen back in Dec-  hasn't changed at all with d/c lisinopril.  We also saw her prior to that patient was supposed to take sinus rinses and Allegra daily and use a humidifier etc. etc. and patient did not do so.       Chronic sinusitis symptoms: Pt currently having a lot of sneezing and in am's - has a lot congestion-it totally clears during the daytime.  Her cough also improves during daytime!     Pt with heartburn sx now still as well-   in Jan 2020 Omeprazole- started twice daily in January for two weeks, then she was suppose to take once daily but hasn't been taking it at all.  She told me she just forgot what it was for her and that she was supposed to continue taking it not stop after the twice a day    Impression and Recommendations:    1. Essential hypertension For her age, blood pressure almost at goal but not quite.  Since her diastolic blood pressures are so low, and pulse is on the low normal side, I am hesitant to increase her blood pressure medications.  - I highly encouraged her to work on a low-salt diet and try to exercise daily.   We will try this and if need be, we will adjust medications in the future  2. Persistent dry cough -Again long discussion with patient regarding all possible etiologies of dry cough. -Again reminded her  that we really need to work on controlling her reflux as well as her upper respiratory congestion would which both can be contributing to her dry cough. -Strongly, strongly encouraged to use all allergy medicines as prescribed as well as the reflux meds so we can rule those out as possible causes and if need be in the future, refer her to ENT for chronic dry cough - omeprazole (PRILOSEC) 20 MG capsule; Take 1 capsule (20 mg total) by mouth 2 (two) times daily before a meal.  Dispense: 180 capsule; Refill: 0  3. Gastroesophageal reflux disease, esophagitis presence not specified -Besides medications reminded patient of diet and lifestyle  modifications, avoid trigger foods, avoid caffeine, and avoid eating or drinking anything within 3 hours of laying down --->   Again patient will do a short burst of omeprazole twice daily for 1 month and then go to once a day dosing. -- Reviewed with patient possible detrimental side effects of higher doses on a regular basis - omeprazole (PRILOSEC) 20 MG capsule; Take 1 capsule (20 mg total) by mouth 2 (two) times daily before a meal.  Dispense: 180 capsule; Refill: 0  4. Post-nasal drainage 5. Head congestion -Asked patient to please do sinus rinses twice daily followed by 1 spray of Flonase each nostril -Also encouraged to avoid exposures as much as possible using bandanna or mask.  Also encouraged to flush nasal passages out after outside for longer periods of time when she is first gets in -Over-the-counter medications reviewed with patient that can possibly help with the symptoms as well.  - As part of my medical decision making, I reviewed the following data within the [facility] History obtained from pt /family, CMA notes reviewed and incorporated if applicable, Labs reviewed, Radiograph/ tests reviewed if applicable and OV notes from prior OV's with me, as well as other specialists she/he has seen since seeing me last, were all reviewed and used in my medical decision making process today.   - Additionally, discussion had with patient regarding txmnt plan, and their biases/concerns about that plan were used in my medical decision making today.   - The patient agreed with the plan and demonstrated an understanding of the instructions.   No barriers to understanding were identified.   - Red flag symptoms and signs discussed in detail.  Patient expressed understanding regarding what to do in case of emergency\ urgent symptoms.  The patient was advised to call back or seek an in-person evaluation if the symptoms worsen or if the condition fails to improve as anticipated.    Return for pt f/up 1 mo- another visit- reck cough- see if omeprazole BID helps.  For just 30 days.   - Also patient will use her allergy medicines and sinus rinses every day.  If no improvement on her symptoms upon return, we will consider specialty referral.   Meds ordered this encounter  Medications  . omeprazole (PRILOSEC) 20 MG capsule    Sig: Take 1 capsule (20 mg total) by mouth 2 (two) times daily before a meal.    Dispense:  180 capsule    Refill:  0    Medications Discontinued During This Encounter  Medication Reason  . omeprazole (PRILOSEC) 20 MG capsule Reorder      I provided 26+ minutes of non-face-to-face time during this encounter,with over 50% of the time in direct counseling on patients medical conditions/ medical concerns.  Additional time was spent with charting and coordination of care after the  actual visit commenced.   Note:  This note was prepared with assistance of Dragon voice recognition software. Occasional wrong-word or sound-a-like substitutions may have occurred due to the inherent limitations of voice recognition software.  Kimberly Dance, DO    -Vitals obtained; medications/ allergies reconciled;  personal medical, social, Sx etc.histories were updated by CMA, reviewed by me and are reflected in chart   Patient Care Team    Relationship Specialty Notifications Start End  Kimberly Dance, DO PCP - General Family Medicine  01/31/16   Gaynelle Arabian, MD Consulting Physician Orthopedic Surgery  01/31/16   Druscilla Brownie, MD Consulting Physician Dermatology  01/31/16   Anastasio Auerbach, MD Consulting Physician Gynecology  02/06/16   Pieter Partridge, [place] Physician Neurology  11/02/16      Patient Active Problem List   Diagnosis Date Noted  . BMI 40.0-44.9, adult ([place]) 11/17/2016    Priority: High  . HTN (hypertension) 06/15/2011    Priority: High  . Glucose intolerance (impaired glucose tolerance) : A1c elevated 11/17/2016     Priority: Medium  . TIA (transient ischemic attack) 01/31/2016    Priority: Medium  . Vitamin D deficiency 11/17/2016    Priority: Low  . GERD (gastroesophageal reflux disease) 11/02/2016    Priority: Low  . OA (osteoarthritis) of knee 05/23/2013    Priority: Low  . Myalgia and myositis 01/30/2012    Priority: Low  . Persistent dry cough 09/09/2018  . Head congestion 09/09/2018  . Pes anserinus bursitis 06/21/2018  . Fatigue 06/05/2018  . Abnormal urinalysis 04/15/2018  . Medication course changed 03/05/2018  . 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 Syncope 01/31/2018  . 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 Elevated ALT measurement 12/03/2017  . Gassiness/ belching 06/20/2017  . Muscle spasm of back 05/21/2017  . Obesity, Class III, BMI 40-49.9 (morbid obesity) ([place]) 03/06/2017  . Pre-operative examination 07/31/2016  . Vaginal mass 07/23/2014  . OAB (overactive bladder) 04/16/2013  . Acute bronchitis 04/16/2013  . Post-nasal drip 05/30/2012  . Finger injury 05/30/2012  . Lung nodule seen on imaging study 01/23/2012  . Flank pain 01/22/2012  . Radicular low back pain 01/22/2012  . Rosacea 06/25/2011  . Back pain 06/15/2011  . Urinary frequency 06/15/2011     Current Meds  Medication Sig  . aspirin EC 81 MG tablet Take 81 mg by mouth daily.  . carvedilol (COREG) 6.25 MG tablet Take 1 tablet (6.25 mg total) by mouth 2 (two) times daily with a meal.  . metroNIDAZOLE (METROCREAM) 0.75 % cream Apply 1 application topically 2 (two) times daily.  Marland Kitchen triamterene-hydrochlorothiazide (MAXZIDE-25) 37.5-25 MG tablet Take 1 tablet by mouth daily.  . Vitamin D, Ergocalciferol, (DRISDOL) 50000 units CAPS capsule Take 1 capsule (50,000 Units total) by mouth 2 (two) times a week.     Allergies:  Allergies  Allergen Reactions  . Other Other (See Comments)    IV dye, heart stopped  . Amlodipine Nausea And Vomiting    Dizziness  . Valsartan Rash      ROS:  See above HPI for pertinent positives and negatives   Objective:   Blood pressure (!) 141/54, pulse 70, height 5\' 2"  (1.575 m), weight 230 lb (104.3 kg).  (if some vitals are omitted, this means that patient was UNABLE to obtain them even though they were asked to get them prior to OV today.  They were asked to call us at their earliest convenience with these  once obtained. )  General: A & O * 3; sounds in no acute distress; in usual state of health.  Skin: Pt confirms warm and dry extremities and pink fingertips HEENT: Pt confirms lips non-cyanotic Chest: Patient confirms normal chest excursion and movement Respiratory: speaking in full sentences, no conversational dyspnea; patient confirms no use of accessory muscles Psych: insight appears good, mood- appears full

## 2018-10-08 ENCOUNTER — Other Ambulatory Visit: Payer: Self-pay | Admitting: Family Medicine

## 2018-10-08 DIAGNOSIS — I10 Essential (primary) hypertension: Secondary | ICD-10-CM

## 2018-10-09 ENCOUNTER — Ambulatory Visit (INDEPENDENT_AMBULATORY_CARE_PROVIDER_SITE_OTHER): Payer: Medicare Other | Admitting: Family Medicine

## 2018-10-09 ENCOUNTER — Other Ambulatory Visit: Payer: Self-pay

## 2018-10-09 ENCOUNTER — Encounter: Payer: Self-pay | Admitting: Family Medicine

## 2018-10-09 VITALS — BP 152/69 | HR 65 | Ht 62.0 in | Wt 225.0 lb

## 2018-10-09 DIAGNOSIS — E559 Vitamin D deficiency, unspecified: Secondary | ICD-10-CM

## 2018-10-09 DIAGNOSIS — I10 Essential (primary) hypertension: Secondary | ICD-10-CM

## 2018-10-09 DIAGNOSIS — Z6841 Body Mass Index (BMI) 40.0 and over, adult: Secondary | ICD-10-CM

## 2018-10-09 DIAGNOSIS — R7302 Impaired glucose tolerance (oral): Secondary | ICD-10-CM

## 2018-10-09 DIAGNOSIS — K219 Gastro-esophageal reflux disease without esophagitis: Secondary | ICD-10-CM | POA: Diagnosis not present

## 2018-10-09 DIAGNOSIS — Z7689 Persons encountering health services in other specified circumstances: Secondary | ICD-10-CM | POA: Insufficient documentation

## 2018-10-09 DIAGNOSIS — R053 Chronic cough: Secondary | ICD-10-CM

## 2018-10-09 DIAGNOSIS — R05 Cough: Secondary | ICD-10-CM | POA: Diagnosis not present

## 2018-10-09 DIAGNOSIS — J3089 Other allergic rhinitis: Secondary | ICD-10-CM | POA: Insufficient documentation

## 2018-10-09 DIAGNOSIS — J329 Chronic sinusitis, unspecified: Secondary | ICD-10-CM

## 2018-10-09 MED ORDER — VITAMIN D (ERGOCALCIFEROL) 1.25 MG (50000 UNIT) PO CAPS: 50000.0000 [IU] | ORAL_CAPSULE | ORAL | 3 refills | Status: DC

## 2018-10-09 MED ORDER — OMEPRAZOLE 20 MG PO CPDR: 20.0000 mg | DELAYED_RELEASE_CAPSULE | Freq: Every day | ORAL | 0 refills | Status: DC

## 2018-10-09 NOTE — Progress Notes (Signed)
Telehealth office visit note for Kimberly Ferrell, D.O- at Primary Care at [facility]   I connected with current patient today and verified that I am speaking with the correct person using two identifiers.   . Location of the patient: Home . Location of the provider: Office Only the patient (+/- their family members at pt's discretion) and myself were participating in the encounter    - This visit type was conducted due to national recommendations for restrictions regarding the COVID-19 Pandemic (e.g. social distancing) in an effort to limit this patient's exposure and mitigate transmission in our community.  This format is felt to be most appropriate for this patient at this time.   - The patient did not have access to video technology or had technical difficulties with video requiring transitioning to audio format only. - No physical exam could be performed with this format, beyond that communicated to [place] by the patient/ family members as noted.   - Additionally my office staff/ schedulers discussed with the patient that there may be a monetary charge related to this service, depending on their medical insurance.   The patient expressed understanding, and agreed to proceed.       History of Present Illness:  Husband having pressure in his heart - talking to his cardiologist about what to do.   His is taking pills for that.  Walking not making it worse when he walks even in rain- 1 hr   Chronic cough/ reflux:  Taken omeprazole twice daily for the past 30 days- both improved.  Still a little in the mornings, then eases off.   Chronic sinusitis symptoms/ allergies: Pt currently having a lot of sneezing/ congestion in am's - - it totally clears during the daytime.   Her cough also improves during daytime!  BP:    Taking triam-hctz and coreg ( HAS ONLY BEEN TAKING ONE TAB DAILY NOT BID) -->  152/69, running 150/ 65-70's on btm  Can't sleep trouble falling sleep- tries to go to bed at  11 pm and wakes around 8-830.  Watches tv until the moment she goes to bed.   Pre-DM:  38mo ago- a1c 5.9 on 06/05/18  Vit D:   Takes  5,000 IU vit D3 just once weekly->  HAS NOT BEEN TAKING IT correctly.  Does not appear to be on a prescription medicine twice weekly as prescribed.      Impression and Recommendations:    1. Essential hypertension   2. Gastroesophageal reflux disease, esophagitis presence not specified   3. Persistent dry cough   4. Vitamin D deficiency   5. Glucose intolerance (impaired glucose tolerance) : A1c elevated   6. Chronic sinusitis, unspecified location   7. Environmental and seasonal allergies   8. BMI 40.0-44.9, adult ([place])   9. Sleep concern-difficulty falling asleep     -Regarding blood pressure.  Told patient goal is to have blood pressure under 150 on a regular basis if next time her blood pressures are running mostly all over 150 we will have to add another blood pressure medicine.  Continue monitoring which patient has not been consistently doing however told to please check at least 3 days/week Monday Wednesday and Friday and write it down and bring in next office visit. -For her reflux, switch from omeprazole twice daily since she took that for 4 weeks to now daily.  We will see if her symptoms still continue to be well controlled and the chronic cough  continues to just be in the a.m.'s and clears quickly -Reminded patient that the persistent dry cough is likely also due to her chronic sinusitis and seasonal environmental allergies.  We did discuss over-the-counter sinus rinses twice daily to do as a preventative which will help with the congestion in the morning. -For her vitamin D deficiency, she has not been taking medicines correctly.  A new prescription was sent to her pharmacy.  Last checked vitamin D was less than 30. -For her BMI again reminded patient that lowering BMI would improve arthritis pains, back pains etc. reminded her that lowering BMI  would lower risk of diabetes due to her prediabetic state -For difficulty with sleep:  Reminded patient as we age we need less sleep.   Rec turning off tv 1 hr prior to bedtime.  And trial of melatonin 1-3mg  q hs prn.  Told her getting 9 to 10 hours of sleep is too much  - As part of my medical decision making, I reviewed the following data within the [place] History obtained from pt /family, CMA notes reviewed and incorporated if applicable, Labs reviewed, Radiograph/ tests reviewed if applicable and OV notes from prior OV's with me, as well as other specialists she/he has seen since seeing me last, were all reviewed and used in my medical decision making process today.   - Additionally, discussion had with patient regarding txmnt plan, and their biases/concerns about that plan were used in my medical decision making today.   - The patient agreed with the plan and demonstrated an understanding of the instructions.   No barriers to understanding were identified.   - Red flag symptoms and signs discussed in detail.  Patient expressed understanding regarding what to do in case of emergency\ urgent symptoms.  The patient was advised to call back or seek an in-person evaluation if the symptoms worsen or if the condition fails to improve as anticipated.   Return for 2) 17-month follow-up bring BP log-will need vitamin D, A1c recheck.    No orders of the defined types were placed in this encounter.   Meds ordered this encounter  Medications  . Vitamin D, Ergocalciferol, (DRISDOL) 1.25 MG (50000 UT) CAPS capsule    Sig: Take 1 capsule (50,000 Units total) by mouth 2 (two) times a week.    Dispense:  24 capsule    Refill:  3  . omeprazole (PRILOSEC) 20 MG capsule    Sig: Take 1 capsule (20 mg total) by mouth daily.    Dispense:  90 capsule    Refill:  0    Medications Discontinued During This Encounter  Medication Reason  . Vitamin D, Ergocalciferol, (DRISDOL) 50000 units CAPS  capsule Reorder  . omeprazole (PRILOSEC) 20 MG capsule       I provided 22+ minutes of non-face-to-face time during this encounter,with over 50% of the time in direct counseling on patients medical conditions/ medical concerns.  Additional time was spent with charting and coordination of care after the actual visit commenced.   Note:  This note was prepared with assistance of Dragon voice recognition software. Occasional wrong-word or sound-a-like substitutions may have occurred due to the inherent limitations of voice recognition software.  Kimberly Dance, DO     Patient Care Team    Relationship Specialty Notifications Start End  Kimberly Dance, DO PCP - General Family Medicine  01/31/16   Gaynelle Arabian, MD Consulting Physician Orthopedic Surgery  01/31/16   Druscilla Brownie, MD Consulting Physician  Dermatology  01/31/16   Anastasio Auerbach, MD Consulting Physician Gynecology  02/06/16   Pieter Partridge, DO Consulting Physician Neurology  11/02/16      -Vitals obtained; medications/ allergies reconciled;  personal medical, social, Sx etc.histories were updated by CMA, reviewed by me and are reflected in chart   Patient Active Problem List   Diagnosis Date Noted  . BMI 40.0-44.9, adult ([place]) 11/17/2016    Priority: High  . HTN (hypertension) 06/15/2011    Priority: High  . Glucose intolerance (impaired glucose tolerance) : A1c elevated 11/17/2016    Priority: Medium  . TIA (transient ischemic attack) 01/31/2016    Priority: Medium  . Rosacea 06/25/2011    Priority: Medium  . History of total knee replacement, left 12/28/2017    Priority: Low  . Vitamin D deficiency 11/17/2016    Priority: Low  . GERD (gastroesophageal reflux disease) 11/02/2016    Priority: Low  . OA (osteoarthritis) of knee 05/23/2013    Priority: Low  . OAB (overactive bladder) 04/16/2013    Priority: Low  . Post-nasal drip 05/30/2012    Priority: Low  . Myalgia and myositis 01/30/2012     Priority: Low  . Environmental and seasonal allergies 10/09/2018  . Sleep concern-difficulty falling asleep 10/09/2018  . Persistent dry cough 09/09/2018  . Head congestion 09/09/2018  . Pes anserinus bursitis 06/21/2018  . Fatigue 06/05/2018  . Abnormal urinalysis 04/15/2018  . Medication course changed 03/05/2018  . 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 Syncope 01/31/2018  . Elevated ALT measurement 12/03/2017  . Gassiness/ belching 06/20/2017  . Muscle spasm of back 05/21/2017  . Obesity, Class III, BMI 40-49.9 (morbid obesity) ([place]) 03/06/2017  . Pre-operative examination 07/31/2016  . Vaginal mass 07/23/2014  . Acute bronchitis 04/16/2013  . Finger injury 05/30/2012  . Lung nodule seen on imaging study 01/23/2012  . Flank pain 01/22/2012  . Radicular low back pain 01/22/2012  . Back pain 06/15/2011  . Urinary frequency 06/15/2011     Current Meds  Medication Sig  . aspirin EC 81 MG tablet Take 81 mg by mouth daily.  . carvedilol (COREG) 6.25 MG tablet TAKE 1 TABLET BY MOUTH TWO  TIMES DAILY WITH A MEAL  . metroNIDAZOLE (METROCREAM) 0.75 % cream Apply 1 application topically 2 (two) times daily.  Marland Kitchen omeprazole (PRILOSEC) 20 MG capsule Take 1 capsule (20 mg total) by mouth daily.  Marland Kitchen triamterene-hydrochlorothiazide (MAXZIDE-25) 37.5-25 MG tablet Take 1 tablet by mouth daily.  . [DISCONTINUED] omeprazole (PRILOSEC) 20 MG capsule Take 1 capsule (20 mg total) by mouth 2 (two) times daily before a meal.  . [DISCONTINUED] Vitamin D, Ergocalciferol, (DRISDOL) 50000 units CAPS capsule Take 1 capsule (50,000 Units total) by mouth 2 (two) times a week.     Allergies:  Allergies  Allergen Reactions  . Other Other (See Comments)    IV dye, heart stopped  . Amlodipine Nausea And Vomiting    Dizziness  . Valsartan Rash     ROS:  See above HPI for pertinent positives and negatives   Objective:   Blood pressure (!) 152/69, pulse 65, height 5\' 2"  (1.575  m), weight 225 lb (102.1 kg).  (if some vitals are omitted, this means that patient was UNABLE to obtain them even though they were asked to get them prior to OV today.  They were asked to call us at their earliest convenience with these once obtained. )  General: A & O * 3; sounds  in no acute distress; in usual state of health.  Skin: Pt confirms warm and dry extremities and pink fingertips HEENT: Pt confirms lips non-cyanotic Chest: Patient confirms normal chest excursion and movement Respiratory: speaking in full sentences, no conversational dyspnea; patient confirms no use of accessory muscles Psych: insight appears good, mood- appears full

## 2018-10-14 ENCOUNTER — Telehealth: Payer: Self-pay | Admitting: Family Medicine

## 2018-10-14 NOTE — Telephone Encounter (Signed)
Patient was under the assumption at last OV that her refills were being sent to [facility]. I advised patient that there were some new orders placed last week that were sent to [facility], but states she just got off the phone with them and they have no such orders. Can we review this please and make sure orders were sent correctly please?

## 2018-10-15 ENCOUNTER — Other Ambulatory Visit: Payer: Self-pay

## 2018-10-15 DIAGNOSIS — K219 Gastro-esophageal reflux disease without esophagitis: Secondary | ICD-10-CM

## 2018-10-15 DIAGNOSIS — R053 Chronic cough: Secondary | ICD-10-CM

## 2018-10-15 DIAGNOSIS — R05 Cough: Secondary | ICD-10-CM

## 2018-10-15 DIAGNOSIS — I10 Essential (primary) hypertension: Secondary | ICD-10-CM

## 2018-10-15 MED ORDER — CARVEDILOL 6.25 MG PO TABS: ORAL_TABLET | ORAL | 0 refills | Status: DC

## 2018-10-15 MED ORDER — OMEPRAZOLE 20 MG PO CPDR: 20.0000 mg | DELAYED_RELEASE_CAPSULE | Freq: Every day | ORAL | 0 refills | Status: DC

## 2018-10-15 NOTE — Telephone Encounter (Signed)
Resent RXs and notified patient. MPulliam, CMA/RT(R)

## 2018-10-15 NOTE — Progress Notes (Signed)
Pharmacy did not receive RX for medications - resent and notified the patient. MPulliam, CMA/RT(R)

## 2018-10-29 DIAGNOSIS — L570 Actinic keratosis: Secondary | ICD-10-CM | POA: Diagnosis not present

## 2018-10-29 DIAGNOSIS — L718 Other rosacea: Secondary | ICD-10-CM | POA: Diagnosis not present

## 2018-10-29 DIAGNOSIS — Z85828 Personal history of other malignant neoplasm of skin: Secondary | ICD-10-CM | POA: Diagnosis not present

## 2018-10-29 DIAGNOSIS — C44319 Basal cell carcinoma of skin of other parts of face: Secondary | ICD-10-CM | POA: Diagnosis not present

## 2018-11-05 DIAGNOSIS — C44319 Basal cell carcinoma of skin of other parts of face: Secondary | ICD-10-CM | POA: Diagnosis not present

## 2018-11-25 ENCOUNTER — Other Ambulatory Visit: Payer: Self-pay | Admitting: Family Medicine

## 2018-11-25 DIAGNOSIS — Z1231 Encounter for screening mammogram for malignant neoplasm of breast: Secondary | ICD-10-CM

## 2019-01-01 ENCOUNTER — Encounter: Payer: Self-pay | Admitting: Family Medicine

## 2019-01-01 ENCOUNTER — Other Ambulatory Visit: Payer: Self-pay

## 2019-01-01 ENCOUNTER — Ambulatory Visit (INDEPENDENT_AMBULATORY_CARE_PROVIDER_SITE_OTHER): Payer: Medicare Other | Admitting: Family Medicine

## 2019-01-01 VITALS — BP 155/78 | Temp 99.4°F | Ht 66.0 in | Wt 224.0 lb

## 2019-01-01 DIAGNOSIS — I739 Peripheral vascular disease, unspecified: Secondary | ICD-10-CM | POA: Diagnosis not present

## 2019-01-01 DIAGNOSIS — M1711 Unilateral primary osteoarthritis, right knee: Secondary | ICD-10-CM

## 2019-01-01 DIAGNOSIS — I10 Essential (primary) hypertension: Secondary | ICD-10-CM | POA: Diagnosis not present

## 2019-01-01 DIAGNOSIS — Z6841 Body Mass Index (BMI) 40.0 and over, adult: Secondary | ICD-10-CM

## 2019-01-01 DIAGNOSIS — K219 Gastro-esophageal reflux disease without esophagitis: Secondary | ICD-10-CM

## 2019-01-01 MED ORDER — CARVEDILOL 6.25 MG PO TABS: ORAL_TABLET | ORAL | 0 refills | Status: DC

## 2019-01-01 NOTE — Progress Notes (Signed)
Telehealth office visit note for Kimberly Ferrell, D.O- at Primary Care at [facility]   I connected with current patient today and verified that I am speaking with the correct person using two identifiers.    Location of the patient: Home  Location of the provider: Office Only the patient (+/- their family members at pt's discretion) and myself were participating in the encounter    - This visit type was conducted due to national recommendations for restrictions regarding the COVID-19 Pandemic (e.g. social distancing) in an effort to limit this patient's exposure and mitigate transmission in our community.  This format is felt to be most appropriate for this patient at this time.   - The patient did not have access to video technology or had technical difficulties with video requiring transitioning to audio format only. - No physical exam could be performed with this format, beyond that communicated to [place] by the patient/ family members as noted.   - Additionally my office staff/ schedulers discussed with the patient that there may be a monetary charge related to this service, depending on their medical insurance.   The patient expressed understanding, and agreed to proceed.       History of Present Illness:  Says doing really good, "I can't complain."  Says everything is "like usual."  - Chronic Leg Pain Says her legs are "the only thing that really bothers me."  Says that cortisone shots have helped her sometimes, but at night her legs sting and burn and wake her up.  "I just don't sleep good because of my legs."  She follows up with Dr. Wynelle Link, who told her that cortisone shots are about "the only thing that would surely help."  - GERD, Chronic Cough Feels her reflux, chronic cough and swallowing has been better.  Notes "the reflux has happened maybe twice since last visit, and other than that, I haven't [had it]."  Says she has heartburn just "once in a while."  In the  morning when she first gets up, she "coughs up a lot of little phlegm like stuff, but I've done that for years and years and I'm still doing that."  1. HTN HPI:  -  Her blood pressure has not been optimally controlled at home.  Pt is checking it at home.   BP running "like usual," around 150/68-69.  Sometimes her BP runs in the 140's, but 150 is the "highest" it's been.  Repeats "it has not been over 150."  - Patient reports good compliance with blood pressure medications. Says "I'm taking it exactly the way you have it written on the bottle."  Says she was on another BP med "long years ago, probably 5-6 years ago." Feels current BP medicine "works better than when I was on that [in the past]." Thinks her medications "seem to be working okay" and her BP "stays pretty level."   - Denies medication S-E. Does not remember having intolerance to BP meds.  - Smoking Status noted   - She denies new onset of: chest pain, exercise intolerance, shortness of breath, dizziness, visual changes, headache, lower extremity swelling or claudication.   Last 3 blood pressure readings in our office are as follows: BP Readings from Last 3 Encounters:  01/01/19 (!) 155/78  10/09/18 (!) 152/69  09/09/18 (!) 141/54    Filed Weights   01/01/19 1505  Weight: 224 lb (101.6 kg)     Impression and Recommendations:    1. BMI  40.0-44.9, adult ([place])   2. Essential hypertension   3. 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 Osteoarthritis of right knee, unspecified osteoarthritis type   5. Gastroesophageal reflux disease, esophagitis presence not specified      GERD - Stable at this time. - Per patient, "once in a while I'll have a little of it, but nothing like I was.  - Continue management as prescribed.  See med list. - If she experiences acute GERD, patient knows to double up on her dose of omeprazole acutely until the episode of heartburn subsides.  Patient will let us know if this  occurs and she needs Rx refills. - Will continue to monitor.  Essential Hypertension - Not optimally controlled at this time. - Discussed goal BP at length with patient today. - Shooting for BP under 150, in 140's consistently.  - Tolerating current meds well now.  - Patient does not tolerate many medications. - Discussed increasing current medication dosage.  See med list. - Increase Carvedilol to 1.5 tablets in the morning and evening. - Discussed need to make changes slowly. - Patient understands to taper her dose up as explained.  - Ongoing ambulatory blood pressure monitoring encouraged. - Patient knows to monitor her blood pressure and heart rate. - Will continue to monitor.   Chronic Leg Pain - Unchanged from prior, chronic. - Per patient, has experienced relief with cortisone shots in the past. - Advised patient to f/up w/ Dr. Wynelle Link. - Patient agrees to ask specialist about options such as pain-relief creams covered under insurance. - Advised patient to look into Voltaren cream OTC. - Patient may also see if Dr. Wynelle Link might assist patient with Rx strength for Voltaren cream. - Will continue to monitor.   Recommendations - Return in 6 weeks for follow-up on BP changes. Med dose change. - Need for Medicare Wellness and lab work in near future as recommended.  - As part of my medical decision making, I reviewed the following data within the [place] History obtained from pt /family, CMA notes reviewed and incorporated if applicable, Labs reviewed, Radiograph/ tests reviewed if applicable and OV notes from prior OV's with me, as well as other specialists she/he has seen since seeing me last, were all reviewed and used in my medical decision making process today.   - Additionally, discussion had with patient regarding txmnt plan, and their biases/concerns about that plan were used in my medical decision making today.   - The patient agreed with the plan  and demonstrated an understanding of the instructions.   No barriers to understanding were identified.   - Red flag symptoms and signs discussed in detail.  Patient expressed understanding regarding what to do in case of emergency\ urgent symptoms.  The patient was advised to call back or seek an in-person evaluation if the symptoms worsen or if the condition fails to improve as anticipated.   Return for 6wks- inc coreg, check on voltaren otc cream-knees, gerd sx.    Meds ordered this encounter  Medications   carvedilol (COREG) 6.25 MG tablet    Sig: TAKE 1.5 TABLETs BY MOUTH TWO TIMES DAILY WITH A MEAL    Dispense:  180 tablet    Refill:  0    Medications Discontinued During This Encounter  Medication Reason   carvedilol (COREG) 6.25 MG tablet       I provided 22  minutes of non-face-to-face time during this encounter,with over 50% of the time in  direct counseling on patients medical conditions/ medical concerns.  Additional time was spent with charting and coordination of care after the actual visit commenced.   Note:  This note was prepared with assistance of Dragon voice recognition software. Occasional wrong-word or sound-a-like substitutions may have occurred due to the inherent limitations of voice recognition software.  This document serves as a record of services personally performed by Kimberly Dance, DO. It was created on her behalf by [name], a trained medical scribe. The creation of this record is based on the scribe's personal observations and the provider's statements to them.   I have reviewed the above medical documentation for accuracy and completeness and I concur.  Kimberly Dance, DO 01/02/2019 7:18 PM     Patient Care Team    Relationship Specialty Notifications Start End  Kimberly Dance, DO PCP - General Family Medicine  01/31/16   Gaynelle Arabian, MD Consulting Physician Orthopedic Surgery  01/31/16   Druscilla Brownie, MD Consulting  Physician Dermatology  01/31/16   Anastasio Auerbach, MD Consulting Physician Gynecology  02/06/16   Pieter Partridge, DO Consulting Physician Neurology  11/02/16      -Vitals obtained; medications/ allergies reconciled;  personal medical, social, Sx etc.histories were updated by CMA, reviewed by me and are reflected in chart   Patient Active Problem List   Diagnosis Date Noted   BMI 40.0-44.9, adult ([place]) 11/17/2016    Priority: High   HTN (hypertension) 06/15/2011    Priority: High   Glucose intolerance (impaired glucose tolerance) : A1c elevated 11/17/2016    Priority: Medium   TIA (transient ischemic attack) 01/31/2016    Priority: Medium   Rosacea 06/25/2011    Priority: Medium   History of total knee replacement, left 12/28/2017    Priority: Low   Vitamin D deficiency 11/17/2016    Priority: Low   GERD (gastroesophageal reflux disease) 11/02/2016    Priority: Low   OA (osteoarthritis) of knee 05/23/2013    Priority: Low   OAB (overactive bladder) 04/16/2013    Priority: Low   Post-nasal drip 05/30/2012    Priority: Low   Myalgia and myositis 01/30/2012    Priority: Low   Environmental and seasonal allergies 10/09/2018   Sleep concern-difficulty falling asleep 10/09/2018   Persistent dry cough 09/09/2018   Head congestion 09/09/2018   Pes anserinus bursitis 06/21/2018   Fatigue 06/05/2018   Abnormal urinalysis 04/15/2018   Medication course changed 03/05/2018   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   carvedilol (COREG) 6.25 MG tablet TAKE 1.5 TABLETs BY MOUTH TWO TIMES DAILY WITH A MEAL   metroNIDAZOLE (METROCREAM) 0.75 % cream Apply 1 application topically 2 (two) times daily.   omeprazole (PRILOSEC) 20 MG capsule Take 1 capsule (20 mg total) by mouth daily.   triamterene-hydrochlorothiazide (MAXZIDE-25) 37.5-25 MG tablet Take 1 tablet by mouth daily.  Vitamin D, Ergocalciferol, (DRISDOL) 1.25 MG (50000 UT) CAPS capsule Take 1 capsule (50,000 Units total) by mouth 2 (two) times a week.   [DISCONTINUED] carvedilol (COREG) 6.25 MG tablet TAKE 1 TABLET BY MOUTH TWO  TIMES DAILY WITH A MEAL     Allergies:  Allergies  Allergen Reactions   Other Other (See Comments)    IV dye, heart stopped   Amlodipine Nausea And Vomiting    Dizziness   Valsartan Rash     ROS:  See above HPI for pertinent positives and negatives   Objective:   Blood pressure (!) 155/78, temperature 99.4 F (37.4 C), height 5\' 6"  (1.676 m), weight 224 lb (101.6 kg).  (if some vitals are omitted, this means that patient was UNABLE to obtain them even though they were asked to get them prior to OV today.  They were asked to call us at their earliest convenience with these once obtained. )  General: A & O * 3; sounds in no acute distress; in usual state of health.  Skin: Pt confirms warm and dry extremities and pink fingertips HEENT: Pt confirms lips non-cyanotic Chest: Patient confirms normal chest excursion and movement Respiratory: speaking in full sentences, no conversational dyspnea; patient confirms no use of accessory muscles Psych: insight appears good, mood- appears full

## 2019-01-09 ENCOUNTER — Other Ambulatory Visit: Payer: Self-pay

## 2019-01-09 ENCOUNTER — Ambulatory Visit
Admission: RE | Admit: 2019-01-09 | Discharge: 2019-01-09 | Disposition: A | Discharge status: Home / Self Care | Payer: Medicare Other | Source: Ambulatory Visit | Attending: Family Medicine | Admitting: Family Medicine

## 2019-01-09 DIAGNOSIS — Z1231 Encounter for screening mammogram for malignant neoplasm of breast: Secondary | ICD-10-CM | POA: Diagnosis not present

## 2019-02-10 ENCOUNTER — Ambulatory Visit (INDEPENDENT_AMBULATORY_CARE_PROVIDER_SITE_OTHER): Payer: Medicare Other | Admitting: Family Medicine

## 2019-02-10 ENCOUNTER — Encounter: Payer: Self-pay | Admitting: Family Medicine

## 2019-02-10 ENCOUNTER — Other Ambulatory Visit: Payer: Self-pay

## 2019-02-10 VITALS — BP 143/79 | HR 65 | Ht 62.0 in | Wt 246.0 lb

## 2019-02-10 DIAGNOSIS — R7302 Impaired glucose tolerance (oral): Secondary | ICD-10-CM | POA: Diagnosis not present

## 2019-02-10 DIAGNOSIS — E559 Vitamin D deficiency, unspecified: Secondary | ICD-10-CM | POA: Diagnosis not present

## 2019-02-10 DIAGNOSIS — I10 Essential (primary) hypertension: Secondary | ICD-10-CM

## 2019-02-10 DIAGNOSIS — M1711 Unilateral primary osteoarthritis, right knee: Secondary | ICD-10-CM | POA: Diagnosis not present

## 2019-02-10 DIAGNOSIS — Z6841 Body Mass Index (BMI) 40.0 and over, adult: Secondary | ICD-10-CM | POA: Diagnosis not present

## 2019-02-10 NOTE — Progress Notes (Signed)
Impression and Recommendations:    1. Essential hypertension   2. BMI 40.0-44.9, adult ([place])   3. Vitamin D deficiency   4. Glucose intolerance (impaired glucose tolerance) : A1c elevated   5. Osteoarthritis of right knee, unspecified osteoarthritis type     Essential Hypertension - Coreg increased six weeks ago. - Per pt, no complaints about BP at this time.  - Patient tolerating treatment plan well without S-E. - Per pt, "I feel fine and would like to stay with what I'm on." - Advised goal BP of 140/90.  - Ongoing prudent lifestyle changes such as dash diet and engaging in a regular exercise program discussed with patient.  Education provided.  - Ambulatory BP monitoring encouraged. Keep log and bring in next OV  - Will continue to monitor.  Glucose Intolerance - A1c elevated - A1c last checked eight months ago on June 05, 2018, and was 5.9 at that time. - A1c re-checked today.  - Counseled patient on prevention of disease and discussed dietary and lifestyle modifications as first line.  Importance of low carb diet discussed with patient in addition to regular exercise.   - Will continue to monitor.  Vitamin D Deficiency - Encouraged patient to continue treatment plan as established.  See med list. - Per pt, continues on 5000 IU's daily OTC and prescription twice weekly. - Need for re-check Vitamin D today.  - Will continue to monitor.  Osteoarthritis of Right Knee; Chronic Knee Pain - Seen by Orthopedics - Discussed prudent use of medications for relief, such as Voltaren gel. - STRONGLY encouraged patient to ice her knees.  - To help alleviate sx of arthritis, encouraged physical activity. - Advised patient to walk on flat ground only and avoid hills. - Encouraged patient to walk regularly, but NOT to the point of pain.  - Patient knows to follow up with orthopedics PRN. - Per pt, has an appointment with Dr. Wynelle Link upcoming next Thursday.  - Will  continue to monitor.  BMI Counseling - Body mass index is 44.99 kg/m Explained to patient what BMI refers to, and what it means medically.    Told patient to think about it as a "medical risk stratification measurement" and how increasing BMI is associated with increasing risk/ or worsening state of various diseases such as hypertension, hyperlipidemia, diabetes, premature OA, depression etc.  American Heart Association guidelines for healthy diet, basically Mediterranean diet, and exercise guidelines of 30 minutes 5 days per week or more discussed in detail.  Health counseling performed.  All questions answered.  Lifestyle & Preventative Health Maintenance - Advised patient to continue working toward exercising to improve overall mental, physical, and emotional health.    - Reviewed the "spokes of the wheel" of mood and health management.  Stressed the importance of ongoing prudent habits, including regular exercise, appropriate sleep hygiene, healthful dietary habits, and prayer/meditation to relax.  - Encouraged patient to engage in daily physical activity, especially a formal exercise routine.  Recommended that the patient eventually strive for at least 150 minutes of moderate cardiovascular activity per week according to guidelines established by the [facility].   - Healthy dietary habits encouraged, including low-carb, and high amounts of lean protein in diet.   - Patient should also consume adequate amounts of water.   Education and routine counseling performed. Handouts provided.   Recommendations - Encouraged patient to verify and update her home med list. - Continue to return for chronic health maintenance visits as scheduled. -  Otherwise, return for Medicare Wellness and acute concerns PRN.   Orders Placed This Encounter  Procedures   VITAMIN D 25 Hydroxy (Vit-D Deficiency, Fractures)   Hemoglobin A1c    The patient was counseled, risk factors were discussed, anticipatory  guidance given.  Gross side effects, risk and benefits, and alternatives of medications discussed with patient.  Patient is aware that all medications have potential side effects and we are unable to predict every side effect or drug-drug interaction that may occur.  Expresses verbal understanding and consents to current therapy plan and treatment regimen.  Return for Hypertension follow up every 4 mo.  Please see AVS handed out to patient at the end of our visit for further patient instructions/ counseling done pertaining to today's office visit.    Note:  This document was prepared using Dragon voice recognition software and may include unintentional dictation errors.  This document serves as a record of services personally performed by Mellody Dance, DO. It was created on her behalf by [name], a trained medical scribe. The creation of this record is based on the scribe's personal observations and the provider's statements to them.   I have reviewed the above medical documentation for accuracy and completeness and I concur.  Mellody Dance, DO 02/10/2019 11:07 AM        Subjective:    HPI: Kimberly Ferrell is a 78 y.o. female who presents to [place] [facility] today for follow up of [place].    States she's "doing pretty good."  - Weight Notes her ideal weight is 120-125 lbs, and she was down near 105 lbs when she was on Weight Watchers in the past.  - Vitamin D Confirms she continues Vitamin D daily, 5000 IU's daily OTC.  Says she usually takes her prescription Vitamin D on Monday and Friday, "just as I get up in the morning when I eat breakfast."  - Knee Pain Says she can't sleep at night because her knees sting and burn.  "I've just had a time of it."  Says she wants to go back to Dr. Wynelle Link.  "It's gotten to where it's bothering my back, and I try to walk with Kimberly Ferrell in the morning."  HTN:  -  Her blood pressure has been  controlled at home.  Pt has been checking it regularly.  Says her blood pressure has been "wonderful," "I've not had any problem."  Says the highest her BP has been was 145/75 at home.  - Patient reports good compliance with blood pressure medications  - Denies medication S-E   - Smoking Status noted   - She denies new onset of: chest pain, exercise intolerance, shortness of breath, dizziness, visual changes, headache, lower extremity swelling or claudication.    Last 3 blood pressure readings in our office are as follows: BP Readings from Last 3 Encounters:  02/10/19 (!) 143/79  01/01/19 (!) 155/78  10/09/18 (!) 152/69    Pulse Readings from Last 3 Encounters:  02/10/19 65  10/09/18 65  09/09/18 70    Filed Weights   02/10/19 1021  Weight: 246 lb (111.6 kg)      Patient Care Team    Relationship Specialty Notifications Start End  Mellody Dance, DO PCP - General Family Medicine  01/31/16   Gaynelle Arabian, MD Consulting Physician Orthopedic Surgery  01/31/16   Druscilla Brownie, MD Consulting Physician Dermatology  01/31/16   Anastasio Auerbach, MD Consulting Physician Gynecology  02/06/16  Pieter Partridge, DO Consulting Physician Neurology  11/02/16      Lab Results  Component Value Date   CREATININE 0.79 06/05/2018   BUN 8 06/05/2018   NA 145 (H) 06/05/2018   K 4.4 06/05/2018   CL 104 06/05/2018   CO2 26 06/05/2018    Lab Results  Component Value Date   CHOL 127 06/05/2018   CHOL 122 11/10/2016   CHOL 136 04/16/2013    Lab Results  Component Value Date   HDL 46 06/05/2018   HDL 40 11/10/2016   HDL 33.30 (L) 04/16/2013    Lab Results  Component Value Date   LDLCALC 63 06/05/2018   [place] 62 11/10/2016   LDLCALC 80 04/16/2013    Lab Results  Component Value Date   TRIG 90 06/05/2018   TRIG 98 11/10/2016   TRIG 116.0 04/16/2013    Lab Results  Component Value Date   CHOLHDL 2.8 06/05/2018   CHOLHDL 3.1 11/10/2016   CHOLHDL 4 04/16/2013     No results found for: LDLDIRECT ===================================================================   Patient Active Problem List   Diagnosis Date Noted   BMI 40.0-44.9, adult ([place]) 11/17/2016    Priority: High   HTN (hypertension) 06/15/2011    Priority: High   Glucose intolerance (impaired glucose tolerance) : A1c elevated 11/17/2016    Priority: Medium   TIA (transient ischemic attack) 01/31/2016    Priority: Medium   Rosacea 06/25/2011    Priority: Medium   History of total knee replacement, left 12/28/2017    Priority: Low   Vitamin D deficiency 11/17/2016    Priority: Low   GERD (gastroesophageal reflux disease) 11/02/2016    Priority: Low   OA (osteoarthritis) of knee 05/23/2013    Priority: Low   OAB (overactive bladder) 04/16/2013    Priority: Low   Post-nasal drip 05/30/2012    Priority: Low   Myalgia and myositis 01/30/2012    Priority: Low   Environmental and seasonal allergies 10/09/2018   Sleep concern-difficulty falling asleep 10/09/2018   Persistent dry cough 09/09/2018   Head congestion 09/09/2018   Pes anserinus bursitis 06/21/2018   Fatigue 06/05/2018   Abnormal urinalysis 04/15/2018   Medication course changed 03/05/2018   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[place]) 01/31/2018   Syncope 01/31/2018   Elevated ALT measurement 12/03/2017   Gassiness/ belching 06/20/2017   Muscle spasm of back 05/21/2017   Obesity, Class III, BMI 40-49.9 (morbid obesity) ([place]) 03/06/2017   Pre-operative examination 07/31/2016   Vaginal mass 07/23/2014   Acute bronchitis 04/16/2013   Finger injury 05/30/2012   Lung nodule seen on imaging study 01/23/2012   Flank pain 01/22/2012   Radicular low back pain 01/22/2012   Back pain 06/15/2011   Urinary frequency 06/15/2011     Past Medical History:  Diagnosis Date   Arthritis    Cancer ([place])    skin cancer removed from right forehead   Chicken pox as child   Colon  polyps    Complication of anesthesia    "hard to wake up"   GERD (gastroesophageal reflux disease)    occasional   Hemorrhoids    "sometimes"   Hypertension    Lung nodule seen on imaging study    noted 8/13- needs CT f/u in 1 year   Mononucleosis    Pneumonia    hx of   PONV (postoperative nausea and vomiting)    Rosacea    TIA (transient ischemic attack) 2007   from IV dye, no  residual, passed out and was told by RN that it was TIA     Past Surgical History:  Procedure Laterality Date   ABDOMINAL HYSTERECTOMY  1993   BREAST BIOPSY Left 2004   [place] Core    CHOLECYSTECTOMY  1992   HEMORRHOID SURGERY  1960's   PARTIAL KNEE ARTHROPLASTY Right 08/09/2016   Procedure: RIGHT UNICOMPARTMENTAL KNEE;  Surgeon: Gaynelle Arabian, MD;  Location: [facility];  Service: Orthopedics;  Laterality: Right;  requests 1hr; Adductor Block   TOTAL KNEE ARTHROPLASTY Left 05/23/2013   Procedure: LEFT TOTAL KNEE ARTHROPLASTY;  Surgeon: Gearlean Alf, MD;  Location: [facility];  Service: Orthopedics;  Laterality: Left;     Family History  Problem Relation Age of Onset   Cancer Mother 66       colon   Sudden death Father 19       suicide   Stroke Maternal Grandmother 18   Hypertension Neg Hx    Hyperlipidemia Neg Hx    Heart attack Neg Hx    Diabetes Neg Hx    Breast cancer Neg Hx      Social History   Substance and Sexual Activity  Drug Use No  ,  Social History   Substance and Sexual Activity  Alcohol Use No  ,  Social History   Tobacco Use  Smoking Status Never Smoker  Smokeless Tobacco Never Used  ,    Current Outpatient Medications on File Prior to Visit  Medication Sig Dispense Refill   aspirin EC 81 MG tablet Take 81 mg by mouth daily.     carvedilol (COREG) 6.25 MG tablet TAKE 1.5 TABLETs BY MOUTH TWO TIMES DAILY WITH A MEAL 180 tablet 0   metroNIDAZOLE (METROCREAM) 0.75 % cream Apply 1 application topically 2 (two) times daily.     omeprazole  (PRILOSEC) 20 MG capsule Take 1 capsule (20 mg total) by mouth daily. 90 capsule 0   triamterene-hydrochlorothiazide (MAXZIDE-25) 37.5-25 MG tablet Take 1 tablet by mouth daily. 90 tablet 0   Vitamin D, Ergocalciferol, (DRISDOL) 1.25 MG (50000 UT) CAPS capsule Take 1 capsule (50,000 Units total) by mouth 2 (two) times a week. 24 capsule 3   No current facility-administered medications on file prior to visit.      Allergies  Allergen Reactions   Other Other (See Comments)    IV dye, heart stopped   Amlodipine Nausea And Vomiting    Dizziness   Valsartan Rash     Review of Systems:   General:  Denies fever, chills Optho/Auditory:   Denies visual changes, blurred vision Respiratory:   Denies SOB, cough, wheeze, DIB  Cardiovascular:   Denies chest pain, palpitations, painful respirations Gastrointestinal:   Denies nausea, vomiting, diarrhea.  Endocrine:     Denies new hot or cold intolerance Musculoskeletal:  Denies joint swelling, gait issues, or new unexplained myalgias/ arthralgias Skin:  Denies rash, suspicious lesions  Neurological:    Denies dizziness, unexplained weakness, numbness  Psychiatric/Behavioral:   Denies mood changes  Objective:    Blood pressure (!) 143/79, pulse 65, height 5\' 2"  (1.575 m), weight 246 lb (111.6 kg), SpO2 96 %.  Body mass index is 44.99 kg/m.  General: Well Developed, well nourished, and in no acute distress.  HEENT: Normocephalic, atraumatic, pupils equal round reactive to light, neck supple, No carotid bruits, no JVD Skin: Warm and dry, cap RF less 2 sec Cardiac: Regular rate and rhythm, S1, S2 WNL's, no murmurs rubs or gallops Respiratory: ECTA B/L, Not  using accessory muscles, speaking in full sentences. NeuroM-Sk: Ambulates w/o assistance, moves ext * 4 w/o difficulty, sensation grossly intact.  Ext: scant edema b/l lower ext Psych: No HI/SI, judgement and insight good, Euthymic mood. Full Affect.

## 2019-02-10 NOTE — Patient Instructions (Signed)
Behavior Modification Ideas for Weight Management  Weight management involves adopting a healthy lifestyle that includes a knowledge of nutrition and exercise, a positive attitude and the right kind of motivation. Internal motives such as better health, increased energy, self-esteem and personal control increase your chances of lifelong weight management success.  Remember to have realistic goals and think long-term success. Believe in yourself and you can do it. The following information will give you ideas to help you meet your goals.  Control Your Home Environment  Eat only while sitting down at the kitchen or dining room table. Do not eat while watching television, reading, cooking, talking on the phone, standing at the refrigerator or working on the computer. Keep tempting foods out of the house - don't buy them. Keep tempting foods out of sight. Have low-calorie foods ready to eat. Unless you are preparing a meal, stay out of the kitchen. Have healthy snacks at your disposal, such as small pieces of fruit, vegetables, canned fruit, pretzels, low-fat string cheese and nonfat cottage cheese.  Control Your Work Environment  Do not eat at your desk or keep tempting snacks at your desk. If you get hungry between meals, plan healthy snacks and bring them with you to work. During your breaks, go for a walk instead of eating. If you work around food, plan in advance the one item you will eat at mealtime. Make it inconvenient to nibble on food by chewing gum, sugarless candy or drinking water or another low-calorie beverage. Do not work through meals. Skipping meals slows down metabolism and may result in overeating at the next meal. If food is available for special occasions, either pick the healthiest item, nibble on low-fat snacks brought from home, don't have anything offered, choose one option and have a small amount, or have only a beverage.  Control Your Mealtime Environment  Serve your  plate of food at the stove or kitchen counter. Do not put the serving dishes on the table. If you do put dishes on the table, remove them immediately when finished eating. Fill half of your plate with vegetables, a quarter with lean protein and a quarter with starch. Use smaller plates, bowls and glasses. A smaller portion will look large when it is in a little dish. Politely refuse second helpings. When fixing your plate, limit portions of food to one scoop/serving or less.   Daily Food Management  Replace eating with another activity that you will not associate with food. Wait 20 minutes before eating something you are craving. Drink a large glass of water or diet soda before eating. Always have a big glass or bottle of water to drink throughout the day. Avoid high-calorie add-ons such as cream with your coffee, butter, mayonnaise and salad dressings.  Shopping: Do not shop when hungry or tired. Shop from a list and avoid buying anything that is not on your list. If you must have tempting foods, buy individual-sized packages and try to find a lower-calorie alternative. Don't taste test in the store. Read food labels. Compare products to help you make the healthiest choices.  Preparation: Chew a piece of gum while cooking meals. Use a quarter teaspoon if you taste test your food. Try to only fix what you are going to eat, leaving yourself no chance for seconds. If you have prepared more food than you need, portion it into individual containers and freeze or refrigerate immediately. Don't snack while cooking meals.  Eating: Eat slowly. Remember it takes about 20 minutes   for your stomach to send a message to your brain that it is full. Don't let fake hunger make you think you need more. The ideal way to eat is to take a bite, put your utensil down, take a sip of water, cut your next bite, take a bit, put your utensil down and so on. Do not cut your food all at one time. Cut only as  needed. Take small bites and chew your food well. Stop eating for a minute or two at least once during a meal or snack. Take breaks to reflect and have conversation.  Cleanup and Leftovers: Label leftovers for a specific meal or snack. Freeze or refrigerate individual portions of leftovers. Do not clean up if you are still hungry.  Eating Out and Social Eating  Do not arrive hungry. Eat something light before the meal. Try to fill up on low-calorie foods, such as vegetables and fruit, and eat smaller portions of the high-calorie foods. Eat foods that you like, but choose small portions. If you want seconds, wait at least 20 minutes after you have eaten to see if you are actually hungry or if your eyes are bigger than your stomach. Limit alcoholic beverages. Try a soda water with a twist of lime. Do not skip other meals in the day to save room for the special event.  At Restaurants: Order  la carte rather than buffet style. Order some vegetables or a salad for an appetizer instead of eating bread. If you order a high-calorie dish, share it with someone. Try an after-dinner mint with your coffee. If you do have dessert, share it with two or more people. Don't overeat because you do not want to waste food. Ask for a doggie bag to take extra food home. Tell the server to put half of your entree in a to go bag before the meal is served to you. Ask for salad dressing, gravy or high-fat sauces on the side. Dip the tip of your fork in the dressing before each bite. If bread is served, ask for only one piece. Try it plain without butter or oil. At Italian restaurants where oil and vinegar is served with bread, use only a small amount of oil and a lot of vinegar for dipping.  At a Friend's House: Offer to bring a dish, appetizer or dessert that is low in calories. Serve yourself small portions or tell the host that you only want a small amount. Stand or sit away from the snack table. Stay away  from the kitchen or stay busy if you are near the food. Limit your alcohol intake.  At Buffets and Cafeterias: Cover most of your plate with lettuce and/or vegetables. Use a salad plate instead of a dinner plate. After eating, clear away your dishes before having coffee or tea.  Entertaining at Home: Explore low-fat, low-cholesterol cookbooks. Use single-serving foods like chicken breasts or hamburger patties. Prepare low-calorie appetizers and desserts.   Holidays: Keep tempting foods out of sight. Decorate the house without using food. Have low-calorie beverages and foods on hand for guests. Allow yourself one planned treat a day. Don't skip meals to save up for the holiday feast. Eat regular, planned meals.   Exercise Well  Make exercise a priority and a planned activity in the day. If possible, walk the entire or part of the distance to work. Get an exercise buddy. Go for a walk with a colleague during one of your breaks, go to the gym, run or   take a walk with a friend, walk in the mall with a shopping companion. Park at the end of the parking lot and walk to the store or office entrance. Always take the stairs all of the way or at least part of the way to your floor. If you have a desk job, walk around the office frequently. Do leg lifts while sitting at your desk. Do something outside on the weekends like going for a hike or a bike ride.   Have a Healthy Attitude  Make health your weight management priority. Be realistic. Have a goal to achieve a healthier you, not necessarily the lowest weight or ideal weight based on calculations or tables. Focus on a healthy eating style, not on dieting. Dieting usually lasts for a short amount of time and rarely produces long-term success. Think long term. You are developing new healthy behaviors to follow next month, in a year and in a decade.    This information is for educational purposes only and is not intended to replace the  advice of your doctor or health care provider. We encourage you to discuss with your doctor any questions or concerns you may have.        Guidelines for Losing Weight   We want weight loss that will last so you should lose 1-2 pounds a week.  THAT IS IT! Please pick THREE things a month to change. Once it is a habit check off the item. Then pick another three items off the list to become habits.  If you are already doing a habit on the list GREAT!  Cross that item off!  Don't drink your calories. Ie, alcohol, soda, fruit juice, and sweet tea.   Drink more water. Drink a glass when you feel hungry or before each meal.   Eat breakfast - Complex carb and protein (likeDannon light and fit yogurt, oatmeal, fruit, eggs, turkey bacon).  Measure your cereal.  Eat no more than one cup a day. (ie Kashi)  Eat an apple a day.  Add a vegetable a day.  Try a new vegetable a month.  Use Pam! Stop using oil or butter to [place].  Don't finish your plate or use smaller plates.  Share your dessert.  Eat sugar free Jello for dessert or frozen grapes.  Don't eat 2-3 hours before bed.  Switch to whole wheat bread, pasta, and brown rice.  Make healthier choices when you eat out. No fries!  Pick baked chicken, NOT fried.  Don't forget to SLOW DOWN when you eat. It is not going anywhere.   Take the stairs.  Park far away in the parking lot  Lift soup cans (or weights) for 10 minutes while watching TV.  Walk at work for 10 minutes during break.  Walk outside 1 time a week with your friend, kids, dog, or significant other.  Start a walking group at church.  Walk the mall as much as you can tolerate.   Keep a food diary.  Weigh yourself daily.  Walk for 15 minutes 3 days per week.  [place] at home more often and eat out less. If life happens and you go back to old habits, it is okay.  Just start over. You can do it!  If you experience chest pain, get short of breath, or tired  during the exercise, please stop immediately and inform your doctor.    Before you even begin to attack a weight-loss plan, it pays to remember this: You are not   fat. You have fat. Losing weight isn't about blame or shame; it's simply another achievement to accomplish. Dieting is like any other skill-you have to buckle down and work at it. As long as you act in a smart, reasonable way, you'll ultimately get where you want to be. Here are some weight loss pearls for you.   1. It's Not a Diet. It's a Lifestyle Thinking of a diet as something you're on and suffering through only for the short term doesn't work. To shed weight and keep it off, you need to make permanent changes to the way you eat. It's OK to indulge occasionally, of course, but if you cut calories temporarily and then revert to your old way of eating, you'll gain back the weight quicker than you can say yo-yo. Use it to lose it. Research shows that one of the best predictors of long-term weight loss is how many pounds you drop in the first month. For that reason, nutritionists often suggest being stricter for the first two weeks of your new eating strategy to build momentum. Cut out added sugar and alcohol and avoid unrefined carbs. After that, figure out how you can reincorporate them in a way that's healthy and maintainable.  2. There's a Right Way to Exercise Working out burns calories and fat and boosts your metabolism by building muscle. But those trying to lose weight are notorious for overestimating the number of calories they burn and underestimating the amount they take in. Unfortunately, your system is biologically programmed to hold on to extra pounds and that means when you start exercising, your body senses the deficit and ramps up its hunger signals. If you're not diligent, you'll eat everything you burn and then some. Use it, to lose it. Cardio gets all the exercise glory, but strength and interval training are the real heroes.  They help you build lean muscle, which in turn increases your metabolism and calorie-burning ability 3. Don't Overreact to Mild Hunger Some people have a hard time losing weight because of hunger anxiety. To them, being hungry is bad-something to be avoided at all costs-so they carry snacks with them and eat when they don't need to. Others eat because they're stressed out or bored. While you never want to get to the point of being ravenous (that's when bingeing is likely to happen), a hunger pang, a craving, or the fact that it's 3:00 p.m. should not send you racing for the vending machine or obsessing about the energy bar in your purse. Ideally, you should put off eating until your stomach is growling and it's difficult to concentrate.  Use it to lose it. When you feel the urge to eat, use the HALT method. Ask yourself, Am I really hungry? Or am I angry or anxious, lonely or bored, or tired? If you're still not certain, try the apple test. If you're truly hungry, an apple should seem delicious; if it doesn't, something else is going on. Or you can try drinking water and making yourself busy, if you are still hungry try a healthy snack.  4. Not All Calories Are Created Equal The mechanics of weight loss are pretty simple: Take in fewer calories than you use for energy. But the kind of food you eat makes all the difference. Processed food that's high in saturated fat and refined starch or sugar can cause inflammation that disrupts the hormone signals that tell your brain you're full. The result: You eat a lot more.  Use it to lose   it. Clean up your diet. Swap in whole, unprocessed foods, including vegetables, lean protein, and healthy fats that will fill you up and give you the biggest nutritional bang for your calorie buck. In a few weeks, as your brain starts receiving regular hunger and fullness signals once again, you'll notice that you feel less hungry overall and naturally start cutting back on the amount  you eat.  5. Protein, Produce, and Plant-Based Fats Are Your Weight-Loss Trinity Here's why eating the three Ps regularly will help you drop pounds. Protein fills you up. You need it to build lean muscle, which keeps your metabolism humming so that you can torch more fat. People in a weight-loss program who ate double the recommended daily allowance for protein (about 110 grams for a 150-pound woman) lost 70 percent of their weight from fat, while people who ate the RDA lost only about 40 percent, one study found. Produce is packed with filling fiber. "It's very difficult to consume too many calories if you're eating a lot of vegetables. Example: Three cups of broccoli is a lot of food, yet only 93 calories. (Fruit is another story. It can be easy to overeat and can contain a lot of calories from sugar, so be sure to monitor your intake.) Plant-based fats like olive oil and those in avocados and nuts are healthy and extra satiating.  Use it to lose it. Aim to incorporate each of the three Ps into every meal and snack. People who eat protein throughout the day are able to keep weight off, according to a study in the American Journal of Clinical Nutrition. In addition to meat, poultry and seafood, good sources are beans, lentils, eggs, tofu, and yogurt. As for fat, keep portion sizes in check by measuring out salad dressing, oil, and nut butters (shoot for one to two tablespoons). Finally, eat veggies or a little fruit at every meal. People who did that consumed 308 fewer calories but didn't feel any hungrier than when they didn't eat more produce.  7. How You Eat Is As Important As What You Eat In order for your brain to register that you're full, you need to focus on what you're eating. Sit down whenever you eat, preferably at a table. Turn off the TV or computer, put down your phone, and look at your food. Smell it. Chew slowly, and don't put another bite on your fork until you swallow. When women ate  lunch this attentively, they consumed 30 percent less when snacking later than those who listened to an audiobook at lunchtime, according to a study in the [employer] Journal of Nutrition. 8. Weighing Yourself Really Works The scale provides the best evidence about whether your efforts are paying off. Seeing the numbers tick up or down or stagnate is motivation to keep going-or to rethink your approach. A 2015 study at [employer] found that daily weigh-ins helped people lose more weight, keep it off, and maintain that loss, even after two years. Use it to lose it. Step on the scale at the same time every day for the best results. If your weight shoots up several pounds from one weigh-in to the next, don't freak out. Eating a lot of salt the night before or having your period is the likely culprit. The number should return to normal in a day or two. It's a steady climb that you need to do something about. 9. Too Much Stress and Too Little Sleep Are Your Enemies When you're tired and frazzled,   your body cranks up the production of cortisol, the stress hormone that can cause carb cravings. Not getting enough sleep also boosts your levels of ghrelin, a hormone associated with hunger, while suppressing leptin, a hormone that signals fullness and satiety. People on a diet who slept only five and a half hours a night for two weeks lost 55 percent less fat and were hungrier than those who slept eight and a half hours, according to a study in the [employer] Journal. Use it to lose it. Prioritize sleep, aiming for seven hours or more a night, which research shows helps lower stress. And make sure you're getting quality zzz's. If a snoring spouse or a fidgety cat wakes you up frequently throughout the night, you may end up getting the equivalent of just four hours of sleep, according to a study from [employer]. Keep pets out of the bedroom, and use a white-noise app to drown out  snoring. 10. You Will Hit a plateau-And You Can Bust Through It As you slim down, your body releases much less leptin, the fullness hormone.  If you're not strength training, start right now. Building muscle can raise your metabolism to help you overcome a plateau. To keep your body challenged and burning calories, incorporate new moves and more intense intervals into your workouts or add another sweat session to your weekly routine. Alternatively, cut an extra 100 calories or so a day from your diet. Now that you've lost weight, your body simply doesn't need as much fuel.    Since food equals calories, in order to lose weight you must either eat fewer calories, exercise more to burn off calories with activity, or both. Food that is not used to fuel the body is stored as fat. A major component of losing weight is to make smarter food choices. Here's how:  1)   Limit non-nutritious foods, such as: Sugar, honey, syrups and candy Pastries, donuts, pies, cakes and cookies Soft drinks, sweetened juices and alcoholic beverages  2)  Cut down on high-fat foods by: - Choosing poultry, fish or lean red meat - Choosing low-fat cooking methods, such as baking, broiling, steaming, grilling and boiling - Using low-fat or non-fat dairy products - Using vinaigrette, herbs, lemon or fat-free salad dressings - Avoiding fatty meats, such as bacon, sausage, franks, ribs and luncheon meats - Avoiding high-fat snacks like nuts, chips and chocolate - Avoiding fried foods - Using less butter, margarine, oil and mayonnaise - Avoiding high-fat gravies, cream sauces and cream-based soups  3) Eat a variety of foods, including: - Fruit and vegetables that are raw, steamed or baked - Whole grains, breads, cereal, rice and pasta - Dairy products, such as low-fat or non-fat milk or yogurt, low-fat cottage cheese and low-fat cheese - Protein-rich foods like chicken, turkey, fish, lean meat and legumes, or beans  4)  Change your eating habits by: - Eat three balanced meals a day to help control your hunger - Watch portion sizes and eat small servings of a variety of foods - Choose low-calorie snacks - Eat only when you are hungry and stop when you are satisfied - Eat slowly and try not to perform other tasks while eating - Find other activities to distract you from food, such as walking, taking up a hobby or being involved in the community - Include regular exercise in your daily routine ( minimum of 20 min of moderate-intensity exercise at least 5 days/week)  - Find a support group,   if necessary, for emotional support in your weight loss journey           Easy ways to cut 100 calories   1. Eat your eggs with hot sauce OR salsa instead of cheese.  Eggs are great for breakfast, but many people consider eggs and cheese to be BFFs. Instead of cheese-1 oz. of cheddar has 114 calories-top your eggs with hot sauce, which contains no calories and helps with satiety and metabolism. Salsa is also a great option!!  2. Top your toast, waffles or pancakes with fresh berries instead of jelly or syrup. Half a cup of berries-fresh, frozen or thawed-has about 40 calories, compared with 2 tbsp. of maple syrup or jelly, which both have about 100 calories. The berries will also give you a good punch of fiber, which helps keep you full and satisfied and won't spike blood sugar quickly like the jelly or syrup. 3. Swap the non-fat latte for black coffee with a splash of half-and-half. Contrary to its name, that non-fat latte has 130 calories and a startling 19g of carbohydrates per 16 oz. serving. Replacing that 'light' drinkable dessert with a black coffee with a splash of half-and-half saves you more than 100 calories per 16 oz. serving. 4. Sprinkle salads with freeze-dried raspberries instead of dried cranberries. If you want a sweet addition to your nutritious salad, stay away from dried cranberries. They have a  whopping 130 calories per  cup and 30g carbohydrates. Instead, sprinkle freeze-dried raspberries guilt-free and save more than 100 calories per  cup serving, adding 3g of belly-filling fiber. 5. Go for mustard in place of mayo on your sandwich. Mustard can add really nice flavor to any sandwich, and there are tons of varieties, from spicy to honey. A serving of mayo is 95 calories, versus 10 calories in a serving of mustard.  Or try an avocado mayo spread: You can find the recipe few click this link: https://www.californiaavocado.com/recipes/recipe-container/california-avocado-mayo 6. Choose a DIY salad dressing instead of the store-bought kind. Mix Dijon or whole grain mustard with low-fat Kefir or red wine vinegar and garlic. 7. Use hummus as a spread instead of a dip. Use hummus as a spread on a high-fiber cracker or tortilla with a sandwich and save on calories without sacrificing taste. 8. Pick just one salad "accessory." Salad isn't automatically a calorie winner. It's easy to over-accessorize with toppings. Instead of topping your salad with nuts, avocado and cranberries (all three will clock in at 313 calories), just pick one. The next day, choose a different accessory, which will also keep your salad interesting. You don't wear all your jewelry every day, right? 9. Ditch the white pasta in favor of spaghetti squash. One cup of cooked spaghetti squash has about 40 calories, compared with traditional spaghetti, which comes with more than 200. Spaghetti squash is also nutrient-dense. It's a good source of fiber and Vitamins A and C, and it can be eaten just like you would eat pasta-with a great tomato sauce and turkey meatballs or with pesto, tofu and spinach, for example. 10. Dress up your chili, soups and stews with non-fat Greek yogurt instead of sour cream. Just a 'dollop' of sour cream can set you back 115 calories and a whopping 12g of fat-seven of which are of the artery-clogging  variety. Added bonus: Greek yogurt is packed with muscle-building protein, calcium and B Vitamins. 11. Mash cauliflower instead of mashed potatoes. One cup of traditional mashed potatoes-in all their creamy goodness-has more than 200   calories, compared to mashed cauliflower, which you can typically eat for less than 100 calories per 1 cup serving. Cauliflower is a great source of the antioxidant indole-3-carbinol (I3C), which may help reduce the risk of some cancers, like breast cancer. 12. Ditch the ice cream sundae in favor of a Greek yogurt parfait. Instead of a cup of ice cream or fro-yo for dessert, try 1 cup of nonfat Greek yogurt topped with fresh berries and a sprinkle of cacao nibs. Both toppings are packed with antioxidants, which can help reduce cellular inflammation and oxidative damage. And the comparison is a no-brainer: One cup of ice cream has about 275 calories; one cup of frozen yogurt has about 230; and a cup of Greek yogurt has just 130, plus twice the protein, so you're less likely to return to the freezer for a second helping. 13. Put olive oil in a spray container instead of using it directly from the bottle. Each tablespoon of olive oil is 120 calories and 15g of fat. Use a mister instead of pouring it straight into the pan or onto a salad. This allows for portion control and will save you more than 100 calories. 14. When baking, substitute canned pumpkin for butter or oil. Canned pumpkin-not pumpkin pie mix-is loaded with Vitamin A, which is important for skin and eye health, as well as immunity. And the comparisons are pretty crazy:  cup of canned pumpkin has about 40 calories, compared to butter or oil, which has more than 800 calories. Yes, 800 calories. Applesauce and mashed banana can also serve as good substitutions for butter or oil, usually in a 1:1 ratio. 15. Top casseroles with high-fiber cereal instead of breadcrumbs. Breadcrumbs are typically made with white bread,  while breakfast cereals contain 5-9g of fiber per serving. Not only will you save more than 150 calories per  cup serving, the swap will also keep you more full and you'll get a metabolism boost from the added fiber. 16. Snack on pistachios instead of macadamia nuts. Believe it or not, you get the same amount of calories from 35 pistachios (100 calories) as you would from only five macadamia nuts. 17. Chow down on kale chips rather than potato chips. This is my favorite 'don't knock it 'till you try it' swap. Kale chips are so easy to make at home, and you can spice them up with a little grated parmesan or chili powder. Plus, they're a mere fraction of the calories of potato chips, but with the same crunch factor we crave so often. 18. Add seltzer and some fruit slices to your cocktail instead of soda or fruit juice. One cup of soda or fruit juice can pack on as much as 140 calories. Instead, use seltzer and fruit slices. The fruit provides valuable phytochemicals, such as flavonoids and anthocyanins, which help to combat cancer and stave off the aging process.       

## 2019-02-11 LAB — VITAMIN D 25 HYDROXY (VIT D DEFICIENCY, FRACTURES): Vit D, 25-Hydroxy: 28.7 ng/mL — ABNORMAL LOW (ref 30.0–100.0)

## 2019-02-11 LAB — HEMOGLOBIN A1C
Est. average glucose Bld gHb Est-mCnc: 114 mg/dL
Hgb A1c MFr Bld: 5.6 % (ref 4.8–5.6)

## 2019-02-13 ENCOUNTER — Other Ambulatory Visit: Payer: Self-pay | Admitting: Family Medicine

## 2019-02-13 ENCOUNTER — Telehealth: Payer: Self-pay

## 2019-02-13 ENCOUNTER — Telehealth: Payer: Self-pay | Admitting: Family Medicine

## 2019-02-13 DIAGNOSIS — E559 Vitamin D deficiency, unspecified: Secondary | ICD-10-CM

## 2019-02-13 MED ORDER — VITAMIN D (ERGOCALCIFEROL) 1.25 MG (50000 UNIT) PO CAPS: ORAL_CAPSULE | ORAL | 3 refills | Status: DC

## 2019-02-13 MED ORDER — VITAMIN D (ERGOCALCIFEROL) 1.25 MG (50000 UNIT) PO CAPS: 50000.0000 [IU] | ORAL_CAPSULE | ORAL | 3 refills | Status: DC

## 2019-02-13 NOTE — Telephone Encounter (Signed)
New RX sent to pharmacy for twice weekly directions per Elizabeth Palau, CMA.  Charyl Bigger, CMA

## 2019-02-13 NOTE — Telephone Encounter (Signed)
Patient & Pharmacy called states there are 2 different dosing instructions on the Rx for Vit D--- pls call patient  Or [facility] to clarify which one is correct .  Patient / Kimberly Ferrell @  [phone]  [facility] Mackey [place]chwood @ [phone]  -- Dion Body

## 2019-02-13 NOTE — Telephone Encounter (Signed)
Spoke with patient and advised her to take prescribed vitamin d and follow up in 4 months for vitamin d recheck.

## 2019-02-13 NOTE — Telephone Encounter (Signed)
-----   Message from Mellody Dance, DO sent at 02/12/2019  6:39 PM EDT ----- Patient has not been taking her vitamin D as instructed and please let her know her vitamin D continues to be very poorly controlled, putting her at increased risk for a life-threatening/ significant fracture as she ages. -Encourage her to take it as prescribed- her prescription 1 which she supposed to taking twice weekly.  Vitamin D looks great at 5.6.  Recheck 6 months to a year

## 2019-02-13 NOTE — Telephone Encounter (Signed)
Spoke with patient regarding results.  She says she is taking Vitamin D3 1.25mg  daily.  She never got the rx for 50000 units.  Which should the patient be taking?

## 2019-02-13 NOTE — Telephone Encounter (Signed)
She should be on the prescription vitamin D as written and we should recheck vitamin D level in 4 months after she is taking that as written.  She can stop the over-the-counter if she would like

## 2019-03-25 ENCOUNTER — Encounter: Payer: Self-pay | Admitting: Family Medicine

## 2019-03-25 ENCOUNTER — Ambulatory Visit (INDEPENDENT_AMBULATORY_CARE_PROVIDER_SITE_OTHER): Payer: Medicare Other | Admitting: Family Medicine

## 2019-03-25 ENCOUNTER — Ambulatory Visit (HOSPITAL_COMMUNITY)
Admission: RE | Admit: 2019-03-25 | Discharge: 2019-03-25 | Disposition: A | Discharge status: Home / Self Care | Payer: Medicare Other | Source: Ambulatory Visit | Attending: Family Medicine | Admitting: Family Medicine

## 2019-03-25 ENCOUNTER — Other Ambulatory Visit: Payer: Self-pay

## 2019-03-25 VITALS — BP 165/84 | HR 69 | Temp 97.6°F | Resp 10 | Ht 66.0 in | Wt 246.0 lb

## 2019-03-25 DIAGNOSIS — M546 Pain in thoracic spine: Secondary | ICD-10-CM

## 2019-03-25 DIAGNOSIS — R1010 Upper abdominal pain, unspecified: Secondary | ICD-10-CM | POA: Diagnosis not present

## 2019-03-25 DIAGNOSIS — R1011 Right upper quadrant pain: Secondary | ICD-10-CM

## 2019-03-25 DIAGNOSIS — R350 Frequency of micturition: Secondary | ICD-10-CM

## 2019-03-25 DIAGNOSIS — N3001 Acute cystitis with hematuria: Secondary | ICD-10-CM

## 2019-03-25 DIAGNOSIS — R1012 Left upper quadrant pain: Secondary | ICD-10-CM | POA: Diagnosis not present

## 2019-03-25 DIAGNOSIS — K449 Diaphragmatic hernia without obstruction or gangrene: Secondary | ICD-10-CM | POA: Diagnosis not present

## 2019-03-25 DIAGNOSIS — R0602 Shortness of breath: Secondary | ICD-10-CM | POA: Diagnosis not present

## 2019-03-25 DIAGNOSIS — I10 Essential (primary) hypertension: Secondary | ICD-10-CM

## 2019-03-25 LAB — POCT URINALYSIS DIPSTICK
Bilirubin, UA: NEGATIVE
Glucose, UA: NEGATIVE
Ketones, UA: NEGATIVE
Nitrite, UA: POSITIVE
Protein, UA: POSITIVE — AB
Spec Grav, UA: >=1.03 — AB (ref 1.010–1.025)
Urobilinogen, UA: 0.2 E.U./dL
pH, UA: 5.5 (ref 5.0–8.0)

## 2019-03-25 MED ORDER — CIPROFLOXACIN HCL 250 MG PO TABS: 250.0000 mg | ORAL_TABLET | Freq: Two times a day (BID) | ORAL | 0 refills | Status: DC

## 2019-03-25 NOTE — Progress Notes (Signed)
Impression and Recommendations:    1. Acute bilateral upper abdominal pain ( R >er L )    2. SOB (shortness of breath)   3. Acute right-sided thoracic back pain   4. Urinary frequency   5. Acute cystitis with hematuria   6. Hypertension, unspecified type       SOB, Acute Bilateral Upper Abdominal Pain (R >er L), Acute R-side Thoracic Back Pain - Discussed patient's symptoms at length during exam. - Education provided and all questions answered.  - -Without further workup and treatment planned, risk to pt's morbidity is high with possible prolonged functional impairment and poor health outcomes.   -Stat imaging ordered today, CT abdomen and pelvis, as well as chest x-ray-these are ordered stat so they will be performed today and results returned today. - Blood work ordered today- should come back within less than 24 hours. -Further treatment plan will be determined based on stat imaging results today.  - Red flag symptoms and indications reviewed with patient during appointment today. - Discussed that if patient's symptoms worsen, she begins vomiting, unable to eat, pain significantly increases or otherwise changes, patient knows to obtain immediate emergency care.   Urinary Frequency. Acute Cystitis w/ Hematuria - Urinalysis obtained today.  Grossly positive. - Culture of urinalysis ordered today. - Ciprofloxacin prescribed today for presumed urinary tract infection since positive nitrates, leukocytes and blood.  Risks\benefits medicines discussed with patient..  See med list.  - Will continue to monitor closely.   Education and routine counseling performed. Handouts provided.   Orders Placed This Encounter  Procedures   CULTURE, URINE COMPREHENSIVE   DG Chest 2 View   CT Abdomen Pelvis Wo Contrast   CBC with Differential/Platelet   CMP w GFR   Gamma GT   Lipase   Bilirubin,Direct/Indirect/Total(Fractionated)   Phosphorus   Magnesium   POC  Urinalysis (dx code Z13.89)    Medications Discontinued During This Encounter  Medication Reason   metroNIDAZOLE (METROCREAM) 0.75 % cream Error     Meds ordered this encounter  Medications   ciprofloxacin (CIPRO) 250 MG tablet    Sig: Take 1 tablet (250 mg total) by mouth 2 (two) times daily.    Dispense:  10 tablet    Refill:  0    The patient was counseled, risk factors were discussed, anticipatory guidance given.  Gross side effects, risk and benefits, and alternatives of medications discussed with patient.  Patient is aware that all medications have potential side effects and we are unable to predict every side effect or drug-drug interaction that may occur.  Expresses verbal understanding and consents to current therapy plan and treatment regimen.   Return for f/up near future in ED if Worse, otherwise as scheduled chronic care.  This document serves as a record of services personally performed by Mellody Dance, DO. It was created on her behalf by [name], a trained medical scribe. The creation of this record is based on the scribe's personal observations and the provider's statements to them.   This case required medical decision making of at least moderate complexity. The above documentation has been reviewed to be accurate and was completed by Marjory Sneddon, D.O.     Subjective:    I, [name], am serving as scribe for Dr. Mellody Dance.  HPI: Kimberly Ferrell is a 78 y.o. female who presents to [facility] today for c/o upper right quadrant abdominal pain.  She has not had a history of kidney stones.  Says her husband told her "you don't need to go see 'em, I'll tell ya what you got; it's a kidney stone!"  Sx for the past 24 hours, starting yesterday around 9 AM.  C/O: Awful pain "up under my chest," in the right upper abdomen.  Notes "not constantly, just every once in a while."  The pain started  yesterday.  "I mashed on it and start holding it, then it starts to ease off."  "It feels like I've gained so much weight in the past day," confirms feeling bloated.  Notes "It doesn't happen all the time; it's a pain that sort of shoots and comes and goes."  Pain "shoots a little bit" into the right upper back.  Denies other shooting pain.  The pain lasts 10 minutes per episode.  Notes urinating more often.  "I got up seven times last night to pee from the time I went to bed."  Eating  / eating certain foods does not make the pain worse or better, "I can't tell no difference."  Drinking water doesn't make the pain worse or better either.  Patient sometimes has SOB when the pain isn't there.  Says sometimes it hurts to breathe when the pain is there.  If she breathes really heavy, it sometimes causes the pain.  Denies:  Diarrhea, fevers, chills, nausea.  Denies change in bowels.  Denies burning while urinating.     Urinalysis    Component Value Date/Time   COLORURINE YELLOW 04/06/2014 Sun 04/06/2014 1605   LABSPEC 1.015 07/23/2014 1604   PHURINE 6.0 07/23/2014 1604   PHURINE 6.0 04/06/2014 1605   GLUCOSEU Negative 07/23/2014 1604   HGBUR Large 07/23/2014 1604   HGBUR NEG 04/06/2014 1605   BILIRUBINUR neg 03/25/2019 0908   BILIRUBINUR Negative 07/23/2014 1604   KETONESUR Negative 07/23/2014 1604   KETONESUR NEG 04/06/2014 1605   PROTEINUR Positive (A) 03/25/2019 0908   PROTEINUR Negative 07/23/2014 1604   PROTEINUR NEG 04/06/2014 1605   UROBILINOGEN 0.2 03/25/2019 0908   UROBILINOGEN 0.2 07/23/2014 1604   NITRITE positive 03/25/2019 0908   NITRITE Negative 07/23/2014 1604   NITRITE NEG 04/06/2014 1605   LEUKOCYTESUR Small (1+) (A) 03/25/2019 0908   LEUKOCYTESUR Negative 07/23/2014 1604    Wt Readings from Last 3 Encounters:  03/25/19 246 lb (111.6 kg)  02/10/19 246 lb (111.6 kg)  01/01/19 224 lb (101.6 kg)   BP Readings from Last 3 Encounters:    03/25/19 (!) 165/84  02/10/19 (!) 143/79  01/01/19 (!) 155/78   Pulse Readings from Last 3 Encounters:  03/25/19 69  02/10/19 65  10/09/18 65   BMI Readings from Last 3 Encounters:  03/25/19 39.71 kg/m  02/10/19 44.99 kg/m  01/01/19 36.15 kg/m     Patient Active Problem List   Diagnosis Date Noted   BMI 40.0-44.9, adult ([place]) 11/17/2016    Priority: High   HTN (hypertension) 06/15/2011    Priority: High   Glucose intolerance (impaired glucose tolerance) : A1c elevated 11/17/2016    Priority: Medium   TIA (transient ischemic attack) 01/31/2016    Priority: Medium   Rosacea 06/25/2011    Priority: Medium   History of total knee replacement, left 12/28/2017    Priority: Low   Vitamin D deficiency 11/17/2016    Priority: Low   GERD (gastroesophageal reflux disease) 11/02/2016    Priority: Low   OA (osteoarthritis) of knee 05/23/2013  Priority: Low   OAB (overactive bladder) 04/16/2013    Priority: Low   Post-nasal drip 05/30/2012    Priority: Low   Myalgia and myositis 01/30/2012    Priority: Low   Environmental and seasonal allergies 10/09/2018   Sleep concern-difficulty falling asleep 10/09/2018   Persistent dry cough 09/09/2018   Head congestion 09/09/2018   Pes anserinus bursitis 06/21/2018   Fatigue 06/05/2018   Abnormal urinalysis 04/15/2018   Medication course changed 03/05/2018   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[place]) 01/31/2018   Syncope 01/31/2018   Elevated ALT measurement 12/03/2017   History of arthroplasty of right knee 06/24/2017   Gassiness/ belching 06/20/2017   Muscle spasm of back 05/21/2017   Obesity, Class III, BMI 40-49.9 (morbid obesity) ([place]) 03/06/2017   Pre-operative examination 07/31/2016   Vaginal mass 07/23/2014   Acute bronchitis 04/16/2013   Finger injury 05/30/2012   Lung nodule seen on imaging study 01/23/2012   Flank pain 01/22/2012   Radicular low back pain 01/22/2012   Back  pain 06/15/2011   Urinary frequency 06/15/2011    Past Surgical History:  Procedure Laterality Date   ABDOMINAL HYSTERECTOMY  1993   BREAST BIOPSY Left 2004   [place] Core    CHOLECYSTECTOMY  1992   HEMORRHOID SURGERY  1960's   PARTIAL KNEE ARTHROPLASTY Right 08/09/2016   Procedure: RIGHT UNICOMPARTMENTAL KNEE;  Surgeon: Gaynelle Arabian, MD;  Location: [facility];  Service: Orthopedics;  Laterality: Right;  requests 1hr; Adductor Block   TOTAL KNEE ARTHROPLASTY Left 05/23/2013   Procedure: LEFT TOTAL KNEE ARTHROPLASTY;  Surgeon: Gearlean Alf, MD;  Location: [facility];  Service: Orthopedics;  Laterality: Left;    Family History  Problem Relation Age of Onset   Cancer Mother 56       colon   Sudden death Father 20       suicide   Stroke Maternal Grandmother 49   Hypertension Neg Hx    Hyperlipidemia Neg Hx    Heart attack Neg Hx    Diabetes Neg Hx    Breast cancer Neg Hx     Social History   Substance and Sexual Activity  Drug Use No  ,  Social History   Substance and Sexual Activity  Alcohol Use No  ,  Social History   Tobacco Use  Smoking Status Never Smoker  Smokeless Tobacco Never Used  ,  Social History   Substance and Sexual Activity  Sexual Activity Not Currently    Patient's Medications  New Prescriptions   CIPROFLOXACIN (CIPRO) 250 MG TABLET    Take 1 tablet (250 mg total) by mouth 2 (two) times daily.  Previous Medications   ASPIRIN EC 81 MG TABLET    Take 81 mg by mouth daily.   CARVEDILOL (COREG) 6.25 MG TABLET    TAKE 1.5 TABLETs BY MOUTH TWO TIMES DAILY WITH A MEAL   CHOLECALCIFEROL (VITAMIN D3) 1.25 MG (50000 UT) TABS    Take 1.25 mg by mouth daily.   OMEPRAZOLE (PRILOSEC) 20 MG CAPSULE    Take 1 capsule (20 mg total) by mouth daily.   TRIAMTERENE-HYDROCHLOROTHIAZIDE (MAXZIDE-25) 37.5-25 MG TABLET    Take 1 tablet by mouth daily.   VITAMIN D, ERGOCALCIFEROL, (DRISDOL) 1.25 MG (50000 UT) CAPS CAPSULE    One tablet twice weekly  Modified  Medications   No medications on file  Discontinued Medications   METRONIDAZOLE (METROCREAM) 0.75 % CREAM    Apply 1 application topically 2 (two) times daily.  Other, Amlodipine, and Valsartan  Current Meds  Medication Sig   aspirin EC 81 MG tablet Take 81 mg by mouth daily.   carvedilol (COREG) 6.25 MG tablet TAKE 1.5 TABLETs BY MOUTH TWO TIMES DAILY WITH A MEAL   Cholecalciferol (VITAMIN D3) 1.25 MG (50000 UT) TABS Take 1.25 mg by mouth daily.   omeprazole (PRILOSEC) 20 MG capsule Take 1 capsule (20 mg total) by mouth daily.   triamterene-hydrochlorothiazide (MAXZIDE-25) 37.5-25 MG tablet Take 1 tablet by mouth daily.   Vitamin D, Ergocalciferol, (DRISDOL) 1.25 MG (50000 UT) CAPS capsule One tablet twice weekly    Review of Systems: General:   No F/C, wt loss Pulm:   No DIB, pleuritic chest pain Card:  No CP, palpitations Abd:  No n/v/d or pain GU:  Dysuria, increased frequency and urgency; no vaginal discharge Ext:  No inc edema from baseline   Objective:  Blood pressure (!) 165/84, pulse 69, temperature 97.6 F (36.4 C), temperature source Oral, resp. rate 10, height 5\' 6"  (1.676 m), weight 246 lb (111.6 kg), SpO2 95 %. Body mass index is 39.71 kg/m.  General: Well Developed, well nourished, and in no acute distress.  HEENT: Normocephalic, atraumatic Skin: Warm and dry, cap RF less 2 sec, good turgor CV: +S1, S2, no murmur gallop or rub Respiratory: ECTA B/L; speaking in full sentences, no conversational dyspnea Abd:  costophrenic angle tenderness on the right with palpation.  Bowel sounds hypoactive *4 quadrants.  guarding upon palpation of the bilateral upper abdomen, upper left more greater than upper right.  No suprapubic tenderness.  No rigidity. Mild-Mod rebound tenderness upon deep palpation in the left upper quadrant, that radiates to her right upper quadrant.. NeuroM-Sk: Ambulates w/o assistance, moves * 4 Psych: A and O *3

## 2019-03-26 ENCOUNTER — Other Ambulatory Visit: Payer: Self-pay | Admitting: Family Medicine

## 2019-03-26 ENCOUNTER — Encounter: Payer: Self-pay | Admitting: Gastroenterology

## 2019-03-26 DIAGNOSIS — R1012 Left upper quadrant pain: Secondary | ICD-10-CM

## 2019-03-26 DIAGNOSIS — R1011 Right upper quadrant pain: Secondary | ICD-10-CM

## 2019-03-26 LAB — CBC WITH DIFFERENTIAL/PLATELET
Basophils Absolute: 0 10*3/uL (ref 0.0–0.2)
Basos: 0 %
EOS (ABSOLUTE): 0.2 10*3/uL (ref 0.0–0.4)
Eos: 2 %
Hematocrit: 42.6 % (ref 34.0–46.6)
Hemoglobin: 14.5 g/dL (ref 11.1–15.9)
Immature Grans (Abs): 0 10*3/uL (ref 0.0–0.1)
Immature Granulocytes: 0 %
Lymphocytes Absolute: 2.4 10*3/uL (ref 0.7–3.1)
Lymphs: 39 %
MCH: 31.3 pg (ref 26.6–33.0)
MCHC: 34 g/dL (ref 31.5–35.7)
MCV: 92 fL (ref 79–97)
Monocytes Absolute: 0.5 10*3/uL (ref 0.1–0.9)
Monocytes: 8 %
Neutrophils Absolute: 3.1 10*3/uL (ref 1.4–7.0)
Neutrophils: 51 %
Platelets: 186 10*3/uL (ref 150–450)
RBC: 4.63 x10E6/uL (ref 3.77–5.28)
RDW: 12.8 % (ref 11.7–15.4)
WBC: 6.2 10*3/uL (ref 3.4–10.8)

## 2019-03-26 LAB — COMPREHENSIVE METABOLIC PANEL
ALT: 28 IU/L (ref 0–32)
AST: 34 IU/L (ref 0–40)
Albumin/Globulin Ratio: 1.4 (ref 1.2–2.2)
Albumin: 4.2 g/dL (ref 3.7–4.7)
Alkaline Phosphatase: 93 IU/L (ref 39–117)
BUN/Creatinine Ratio: 12 (ref 12–28)
BUN: 9 mg/dL (ref 8–27)
Bilirubin Total: 0.7 mg/dL (ref 0.0–1.2)
CO2: 24 mmol/L (ref 20–29)
Calcium: 9.6 mg/dL (ref 8.7–10.3)
Chloride: 105 mmol/L (ref 96–106)
Creatinine, Ser: 0.74 mg/dL (ref 0.57–1.00)
GFR calc Af Amer: 90 mL/min/{1.73_m2} (ref >59)
GFR calc non Af Amer: 78 mL/min/{1.73_m2} (ref >59)
Globulin, Total: 3 g/dL (ref 1.5–4.5)
Glucose: 113 mg/dL — ABNORMAL HIGH (ref 65–99)
Potassium: 4.4 mmol/L (ref 3.5–5.2)
Sodium: 143 mmol/L (ref 134–144)
Total Protein: 7.2 g/dL (ref 6.0–8.5)

## 2019-03-26 LAB — MAGNESIUM: Magnesium: 1.9 mg/dL (ref 1.6–2.3)

## 2019-03-26 LAB — GAMMA GT: GGT: 46 IU/L (ref 0–60)

## 2019-03-26 LAB — BILIRUBIN, FRACTIONATED(TOT/DIR/INDIR)
Bilirubin, Direct: 0.16 mg/dL (ref 0.00–0.40)
Bilirubin, Indirect: 0.54 mg/dL (ref 0.10–0.80)

## 2019-03-26 LAB — LIPASE: Lipase: 52 U/L (ref 14–85)

## 2019-03-26 LAB — PHOSPHORUS: Phosphorus: 3.2 mg/dL (ref 3.0–4.3)

## 2019-03-28 ENCOUNTER — Ambulatory Visit: Payer: Medicare Other | Admitting: Family Medicine

## 2019-03-29 LAB — CULTURE, URINE COMPREHENSIVE

## 2019-03-31 ENCOUNTER — Telehealth: Payer: Self-pay | Admitting: Family Medicine

## 2019-03-31 NOTE — Telephone Encounter (Signed)
Patient is aware that referral was placed when she was seen by Dr. Jenetta Downer for acute abdominal pain with SOB. Patient said she just wanted to make sure she needed to go to apt before scheduling. Patient states she is going to keep apt. AS, CMA

## 2019-03-31 NOTE — Telephone Encounter (Signed)
Patient called, stating that her PCP has set her up an appointment with the [facility] on Friday, 04/04/19 and patient has a couple of questions regarding this appointment with the [facility],  questioning why does she have this appointment as she states she is feeling fine. Please Advise.

## 2019-04-03 ENCOUNTER — Other Ambulatory Visit: Payer: Self-pay

## 2019-04-03 ENCOUNTER — Encounter: Payer: Self-pay | Admitting: Family Medicine

## 2019-04-03 ENCOUNTER — Ambulatory Visit (INDEPENDENT_AMBULATORY_CARE_PROVIDER_SITE_OTHER): Payer: Medicare Other | Admitting: Family Medicine

## 2019-04-03 VITALS — Ht 66.0 in | Wt 246.0 lb

## 2019-04-03 DIAGNOSIS — G4700 Insomnia, unspecified: Secondary | ICD-10-CM

## 2019-04-03 MED ORDER — TRAZODONE HCL 50 MG PO TABS: 25.0000 mg | ORAL_TABLET | Freq: Every evening | ORAL | 0 refills | Status: DC | PRN

## 2019-04-03 NOTE — Progress Notes (Signed)
Telehealth office visit note for Kimberly Ferrell, D.O- at Primary Care at [facility]   I connected with current patient today and verified that I am speaking with the correct person using two identifiers.   . Location of the patient: Home . Location of the provider: Office Only the patient (+/- their family members at pt's discretion) and myself were participating in the encounter - This visit type was conducted due to national recommendations for restrictions regarding the COVID-19 Pandemic (e.g. social distancing) in an effort to limit this patient's exposure and mitigate transmission in our community.  This format is felt to be most appropriate for this patient at this time.   - The patient did not have access to video technology or had technical difficulties with video requiring transitioning to audio format only. - No physical exam could be performed with this format, beyond that communicated to [place] by the patient/ family members as noted.   - Additionally my office staff/ schedulers discussed with the patient that there may be a monetary charge related to this service, depending on their medical insurance.   The patient expressed understanding, and agreed to proceed.       History of Present Illness:  I, Kimberly Ferrell, am serving as scribe for Dr. Mellody Ferrell.    Says they only go out to walk, and if they do go to the grocery store, they "wear their masks and everything."   - Insomnia States "I feel okay, but I just cannot sleep."  Says "I go to bed usually around 11 or 11:30 at night because I'm a late-going-to-bed person.  But I go and sometimes I'm still awake at 4 o'clock in the morning."  Says "I don't know what's wrong, I just cannot go to sleep!"  Had issues with sleeping years ago, "but then that sort of got better.  Lord it was, Best boy, it's probably been 79 or 30 years ago I had that problem."  Has never taken a medication for sleep in the past.  Says  "I'm not one to have headaches, but for the past week or so I've had a mild headache quite often, and Josph Macho said it's probably because you ain't getting no sleep."  Not worst HA of life.  Not too bothersome per pt  Notes she doesn't sleep during the day; "I just don't sleep hardly period, to tell you the truth.  I wasn't like this until this situation came up with my son.  I guess I'm just worried about him and his situation."   - Acute Stress, Worries about Son Says "I'm just sort of worried about my son; he lost his job.  They laid off nine of 'em and he's without a job, and his wife has real bad eye problems, and she had to go to [facility] and they're talking about they may have to put a cornea replacement in her eye, and he's really been down, and that's sort of been bearing down on me too.  Other than that, everything else is okay."  Notes "I told Wendall Stade probably have to help pay some of their bills because she's been out with her eye trouble, not seein', and she does a lot of computer work in her job."   Per patient, everything else in life is okay.    GAD 7 : Generalized Anxiety Score 10/09/2018  Nervous, Anxious, on Edge 0  Control/stop worrying 0  Worry too much - different things 0  Trouble relaxing 0  Restless 0  Easily annoyed or irritable 0  Afraid - awful might happen 0  Total GAD 7 Score 0  Anxiety Difficulty Not difficult at all    Depression screen [facility] 2/9 04/03/2019 03/25/2019 02/10/2019 10/09/2018 09/09/2018  Decreased Interest 0 0 0 0 0  Down, Depressed, Hopeless 0 0 0 0 0  PHQ - 2 Score 0 0 0 0 0  Altered sleeping 1 0 0 3 3  Tired, decreased energy 0 0 0 0 0  Change in appetite 0 0 0 1 0  Feeling bad or failure about yourself  0 0 0 0 0  Trouble concentrating 0 0 0 0 0  Moving slowly or fidgety/restless 0 0 0 0 0  Suicidal thoughts 0 0 0 0 0  PHQ-9 Score 1 0 0 4 3  Difficult doing work/chores Not difficult at all - Not difficult at all Not difficult at all Not  difficult at all  Some recent data might be hidden      Impression and Recommendations:    1. Insomnia, unspecified type      Insomnia - Symptoms of insomnia discussed at length with patient today. - Reviewed prudent sleep hygiene.  Education provided and all questions answered.  - Advised 7-8 hours of sleep as ideal sleep cycle. - Discussed that sleep patterns do change with age. - Told patient to avoid napping during the day to help maintain sleep-wake cycle.  - Discussed options of short-term prescription intervention to restore sleep. - Begin low-dose prescription today.  See med list.  - Patient states she agrees to try it "for a while."  - Will continue to monitor.  Recommendations - Return in 6-8 weeks for follow-up on sleep management.   - As part of my medical decision making, I reviewed the following data within the [place] History obtained from pt /family, CMA notes reviewed and incorporated if applicable, Labs reviewed, Radiograph/ tests reviewed if applicable and OV notes from prior OV's with me, as well as other specialists she/he has seen since seeing me last, were all reviewed and used in my medical decision making process today.    - Additionally, discussion had with patient regarding our treatment plan, and their biases/concerns about that plan were used in my medical decision making today.    - The patient agreed with the plan and demonstrated an understanding of the instructions.   No barriers to understanding were identified.    - Red flag symptoms and signs discussed in detail.  Patient expressed understanding regarding what to do in case of emergency\ urgent symptoms.   - The patient was advised to call back or seek an in-person evaluation if the symptoms worsen or if the condition fails to improve as anticipated.   Return for 6-8 wks after starting Trazodone prn sleep.    No orders of the defined types were placed in this  encounter.   Meds ordered this encounter  Medications  . traZODone (DESYREL) 50 MG tablet    Sig: Take 0.5-1 tablets (25-50 mg total) by mouth at bedtime as needed for sleep.    Dispense:  90 tablet    Refill:  0    Medications Discontinued During This Encounter  Medication Reason  . ciprofloxacin (CIPRO) 250 MG tablet Error     I provided 10 minutes of non face-to-face time during this encounter.  Additional time was spent with charting and coordination of care after the actual visit commenced.  Note:  This note was prepared with assistance of Dragon voice recognition software. Occasional wrong-word or sound-a-like substitutions may have occurred due to the inherent limitations of voice recognition software.  This document serves as a record of services personally performed by Kimberly Dance, DO. It was created on her behalf by Kimberly Ferrell, a trained medical scribe. The creation of this record is based on the scribe's personal observations and the provider's statements to them.   This case required medical decision making of at least moderate complexity. The above documentation has been reviewed to be accurate and was completed by Marjory Sneddon, D.O.      Patient Care Team    Relationship Specialty Notifications Start End  Kimberly Dance, DO PCP - General Family Medicine  01/31/16   Gaynelle Arabian, MD Consulting Physician Orthopedic Surgery  01/31/16   Druscilla Brownie, MD Consulting Physician Dermatology  01/31/16   Anastasio Auerbach, MD Consulting Physician Gynecology  02/06/16   Pieter Partridge, DO Consulting Physician Neurology  11/02/16     -Vitals obtained; medications/ allergies reconciled;  personal medical, social, Sx etc.histories were updated by CMA, reviewed by me and are reflected in chart   Patient Active Problem List   Diagnosis Date Noted  . BMI 40.0-44.9, adult ([place]) 11/17/2016    Priority: High  . HTN (hypertension) 06/15/2011    Priority:  High  . Glucose intolerance (impaired glucose tolerance) : A1c elevated 11/17/2016    Priority: Medium  . TIA (transient ischemic attack) 01/31/2016    Priority: Medium  . Rosacea 06/25/2011    Priority: Medium  . History of total knee replacement, left 12/28/2017    Priority: Low  . Vitamin D deficiency 11/17/2016    Priority: Low  . GERD (gastroesophageal reflux disease) 11/02/2016    Priority: Low  . OA (osteoarthritis) of knee 05/23/2013    Priority: Low  . OAB (overactive bladder) 04/16/2013    Priority: Low  . Post-nasal drip 05/30/2012    Priority: Low  . Myalgia and myositis 01/30/2012    Priority: Low  . Environmental and seasonal allergies 10/09/2018  . Sleep concern-difficulty falling asleep 10/09/2018  . Persistent dry cough 09/09/2018  . Head congestion 09/09/2018  . Pes anserinus bursitis 06/21/2018  . Fatigue 06/05/2018  . Abnormal urinalysis 04/15/2018  . Medication course changed 03/05/2018  . 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[place]) 01/31/2018  . Syncope 01/31/2018  . Elevated ALT measurement 12/03/2017  . History of arthroplasty of right knee 06/24/2017  . Gassiness/ belching 06/20/2017  . Muscle spasm of back 05/21/2017  . Obesity, Class III, BMI 40-49.9 (morbid obesity) ([place]) 03/06/2017  . Pre-operative examination 07/31/2016  . Vaginal mass 07/23/2014  . Acute bronchitis 04/16/2013  . Finger injury 05/30/2012  . Lung nodule seen on imaging study 01/23/2012  . Flank pain 01/22/2012  . Radicular low back pain 01/22/2012  . Back pain 06/15/2011  . Urinary frequency 06/15/2011     Current Meds  Medication Sig  . Vitamin D, Ergocalciferol, (DRISDOL) 1.25 MG (50000 UT) CAPS capsule One tablet twice weekly     Allergies:  Allergies  Allergen Reactions  . Other Other (See Comments)    IV dye, heart stopped  . Amlodipine Nausea And Vomiting    Dizziness  . Valsartan Rash     ROS:  See above HPI for pertinent positives and negatives    Objective:   Height 5\' 6"  (1.676 m), weight 246 lb (111.6 kg).  (if some vitals are  omitted, this means that patient was UNABLE to obtain them even though they were asked to get them prior to OV today.  They were asked to call us at their earliest convenience with these once obtained. ) General: A & O * 3; sounds in no acute distress; in usual state of health.  Skin: Pt confirms warm and dry extremities and pink fingertips HEENT: Pt confirms lips non-cyanotic Chest: Patient confirms normal chest excursion and movement Respiratory: speaking in full sentences, no conversational dyspnea; patient confirms no use of accessory muscles Psych: insight appears good, mood- appears full

## 2019-04-04 ENCOUNTER — Ambulatory Visit: Payer: Medicare Other | Admitting: Gastroenterology

## 2019-06-11 ENCOUNTER — Ambulatory Visit (INDEPENDENT_AMBULATORY_CARE_PROVIDER_SITE_OTHER): Payer: Medicare Other | Admitting: Family Medicine

## 2019-06-11 ENCOUNTER — Encounter: Payer: Self-pay | Admitting: Family Medicine

## 2019-06-11 ENCOUNTER — Other Ambulatory Visit: Payer: Self-pay

## 2019-06-11 DIAGNOSIS — G459 Transient cerebral ischemic attack, unspecified: Secondary | ICD-10-CM | POA: Diagnosis not present

## 2019-06-11 DIAGNOSIS — E559 Vitamin D deficiency, unspecified: Secondary | ICD-10-CM

## 2019-06-11 DIAGNOSIS — G4700 Insomnia, unspecified: Secondary | ICD-10-CM | POA: Insufficient documentation

## 2019-06-11 DIAGNOSIS — I10 Essential (primary) hypertension: Secondary | ICD-10-CM

## 2019-06-11 DIAGNOSIS — Z9189 Other specified personal risk factors, not elsewhere classified: Secondary | ICD-10-CM

## 2019-06-11 MED ORDER — VITAMIN D (ERGOCALCIFEROL) 1.25 MG (50000 UNIT) PO CAPS: ORAL_CAPSULE | ORAL | 1 refills | Status: DC

## 2019-06-11 MED ORDER — MELATONIN 3 MG PO CAPS: ORAL_CAPSULE | ORAL | 0 refills | Status: DC

## 2019-06-11 MED ORDER — TRAZODONE HCL 100 MG PO TABS: 100.0000 mg | ORAL_TABLET | Freq: Every evening | ORAL | 0 refills | Status: DC | PRN

## 2019-06-11 MED ORDER — TRIAMTERENE-HCTZ 37.5-25 MG PO TABS: 1.0000 | ORAL_TABLET | Freq: Every day | ORAL | 0 refills | Status: DC

## 2019-06-11 MED ORDER — CARVEDILOL 12.5 MG PO TABS: 12.5000 mg | ORAL_TABLET | Freq: Two times a day (BID) | ORAL | 3 refills | Status: DC

## 2019-06-11 NOTE — Progress Notes (Signed)
Impression and Recommendations:    1. Severe obesity (BMI 35.0-39.9) with comorbidity ([place])   2. Insomnia, unspecified type   3. Essential hypertension   4. TIA (transient ischemic attack)   5. Vitamin D deficiency     Insomnia - Sleep Management - Started low-dose trazodone last OV. - Per patient, has been taking full 50 mg tablet nightly. - Per patient, trazodone sometimes provides assistance, sometimes does not. - no s-e at all, no dizziness/ hangover effect etc - Discussed that patient may increase her dosage of trazodone to 100 mg nightly. - New prescription provided.  - Discussed use of sustained release melatonin nightly. - Encouraged patient to purchase this OTC. - Begin with 3 mg but may increase to 10 mg PRN.  - Prudent use of medication reviewed with patient today.  - Will continue to monitor.   Essential Hypertension - History of TIA - Blood pressure currently is suboptimally controlled, with systolic reading regularly over 160.  - Increase Coreg to 12.5 mg twice daily. - Patient knows to monitor for dizziness or lightheadedness, and low pulse. - Patient agrees to check her BP and pulse frequently - couple times per day and prn. - Discussed goal BP of around 140/90.  - Counseled patient on pathophysiology of disease and discussed various treatment options, which always includes dietary and lifestyle modification as first line.   - Lifestyle changes such as dash and heart healthy diets and engaging in a regular exercise program discussed extensively with patient.   - Ambulatory blood pressure monitoring encouraged at least 3 times weekly.  Keep log and bring in every office visit.  Reminded patient that if they ever feel poorly in any way, to check their blood pressure and pulse.  - We will continue to monitor closely. - Patient agrees to call in 4 weeks to report BP and heart rate.   BMI Counseling - Body mass index is 39.98 kg/m, Severe  Obesity Explained to patient what BMI refers to, and what it means medically.    Told patient to think about it as a "medical risk stratification measurement" and how increasing BMI is associated with increasing risk/ or worsening state of various diseases such as hypertension, hyperlipidemia, diabetes, premature OA, depression etc.  American Heart Association guidelines for healthy diet, basically Mediterranean diet, and exercise guidelines of 30 minutes 5 days per week or more discussed in detail.  Health counseling performed.  All questions answered.   Lifestyle & Preventative Health Maintenance - Advised patient to continue working toward exercising to improve overall mental, physical, and emotional health.    - Reviewed the "spokes of the wheel" of mood and health management.  Stressed the importance of ongoing prudent habits, including regular exercise, appropriate sleep hygiene, healthful dietary habits, and prayer/meditation to relax.  - Encouraged patient to engage in daily physical activity as tolerated, especially a formal exercise routine.  Recommended that the patient eventually strive for at least 150 minutes of moderate cardiovascular activity per week according to guidelines established by the [facility].   - Healthy dietary habits encouraged, including low-carb, and high amounts of lean protein in diet.   - Patient should also consume adequate amounts of water.   Recommendations - Call in with BP log and pulse in near future - 4 wks-  to monitor progress on increased Coreg. - importance of cross checking med list from today with what she is taking at home stressed to pt.  Medication adherence d/c pt  Meds ordered this encounter  Medications  . Vitamin D, Ergocalciferol, (DRISDOL) 1.25 MG (50000 UNIT) CAPS capsule    Sig: One tablet twice weekly    Dispense:  24 capsule    Refill:  1  . carvedilol (COREG) 12.5 MG tablet    Sig: Take 1 tablet (12.5 mg total) by mouth 2 (two)  times daily with a meal.    Dispense:  60 tablet    Refill:  3  . traZODone (DESYREL) 100 MG tablet    Sig: Take 1 tablet (100 mg total) by mouth at bedtime as needed for sleep.    Dispense:  90 tablet    Refill:  0  . Melatonin 3 MG CAPS    Sig: 1-3 tabs of the sustained release melatonin OTC per night before bed    Dispense:       Refill:  0  . triamterene-hydrochlorothiazide (MAXZIDE-25) 37.5-25 MG tablet    Sig: Take 1 tablet by mouth daily.    Dispense:  90 tablet    Refill:  0    Medications Discontinued During This Encounter  Medication Reason  . triamterene-hydrochlorothiazide (MAXZIDE-25) 37.5-25 MG tablet Error  . carvedilol (COREG) 6.25 MG tablet Dose change  . traZODone (DESYREL) 50 MG tablet Dose change  . Vitamin D, Ergocalciferol, (DRISDOL) 1.25 MG (50000 UT) CAPS capsule      Gross side effects, risk and benefits, and alternatives of medications and treatment plan in general discussed with patient.  Patient is aware that all medications have potential side effects and we are unable to predict every side effect or drug-drug interaction that may occur.   Patient will call with any questions prior to using medication if they have concerns.    Expresses verbal understanding and consents to current therapy and treatment regimen.  No barriers to understanding were identified.  Red flag symptoms and signs discussed in detail.  Patient expressed understanding regarding what to do in case of emergency\urgent symptoms  Please see AVS handed out to patient at the end of our visit for further patient instructions/ counseling done pertaining to today's office visit.   Return call us in 4wks-report BP and pulse log;, for  3 mo f/up to review progress on inc coreg dose; inc trazodone dose, add SR melatonin.     Note:  This note was prepared with assistance of Dragon voice recognition software. Occasional wrong-word or sound-a-like substitutions may have occurred due to the  inherent limitations of voice recognition software.   This document serves as a record of services personally performed by Mellody Dance, DO. It was created on her behalf by [name], a trained medical scribe. The creation of this record is based on the scribe's personal observations and the provider's statements to them.   This case required medical decision making of at least moderate complexity. The above documentation has been reviewed to be accurate and was completed by Marjory Sneddon, D.O.      -------------------------------------------------------------------------------------------------------------------------------------------------------------------------------------------------------------------------------------------- Subjective:    Phillips Odor, am serving as scribe for Dr. Mellody Dance.   HPI: MILLARD COLBAUGH is a 79 y.o. female who presents to [place] at [facility] today for issues as discussed below.  Notes takes baby aspirin and Vitamin D.  She tries to go for short walks, usually in the morning.   - Muscle Strain, Back Pain Notes she's been sore lately because she's been cleaning out her basement.   - Sleep Habits Is taking the  full 50 mg tablet of trazodone.  Notes "I go to sleep, but I'm usually awake around 2, 2:30 in the morning, then after that I go back to sleep and Josph Macho wakes me up at 8:30."  When she wakes up at night, notes "I just lay there."  She doesn't turn the TV on or anything because she will wake Fred up as well.  Denies S-E on trazodone.  Says sometimes it helps her fall asleep better, "sometimes yes, and sometimes no."   HPI:  Hypertension:  -  Her blood pressure at home has been running: "everything's been doing good, I feel good, I have no problems whatsoever."  Says it runs 167/70-something at home.  Notes sometimes it's 160, sometimes it's 165-167, "but it hasn't run over 167."  Feels it's  more than often over 160.  She does not monitor her pulse.  - Patient reports good compliance with medication and/or lifestyle modification  - Her denies acute concerns or problems related to treatment plan  - She denies new onset of: chest pain, exercise intolerance, shortness of breath, dizziness, visual changes, headache, lower extremity swelling or claudication.   Last 3 blood pressure readings in our office are as follows: BP Readings from Last 3 Encounters:  06/11/19 (!) 182/84  03/25/19 (!) 165/84  02/10/19 (!) 143/79   Filed Weights   06/11/19 1033  Weight: 247 lb 11.2 oz (112.4 kg)    Wt Readings from Last 3 Encounters:  06/11/19 247 lb 11.2 oz (112.4 kg)  04/03/19 246 lb (111.6 kg)  03/25/19 246 lb (111.6 kg)   BP Readings from Last 3 Encounters:  06/11/19 (!) 182/84  03/25/19 (!) 165/84  02/10/19 (!) 143/79   Pulse Readings from Last 3 Encounters:  06/11/19 69  03/25/19 69  02/10/19 65   BMI Readings from Last 3 Encounters:  06/11/19 39.98 kg/m  04/03/19 39.71 kg/m  03/25/19 39.71 kg/m     Patient Care Team    Relationship Specialty Notifications Start End  Mellody Dance, DO PCP - General Family Medicine  01/31/16   Gaynelle Arabian, MD Consulting Physician Orthopedic Surgery  01/31/16   Druscilla Brownie, MD Consulting Physician Dermatology  01/31/16   Anastasio Auerbach, MD Consulting Physician Gynecology  02/06/16   Pieter Partridge, [place] Physician Neurology  11/02/16      Patient Active Problem List   Diagnosis Date Noted  . BMI 40.0-44.9, adult ([place]) 11/17/2016  . HTN (hypertension) 06/15/2011  . Glucose intolerance (impaired glucose tolerance) : A1c elevated 11/17/2016  . TIA (transient ischemic attack) 01/31/2016  . Rosacea 06/25/2011  . 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 Vitamin D deficiency 11/17/2016  . GERD (gastroesophageal reflux disease) 11/02/2016  . OA (osteoarthritis) of knee 05/23/2013  . OAB  (overactive bladder) 04/16/2013  . Post-nasal drip 05/30/2012  . Myalgia and myositis 01/30/2012  . Insomnia 06/11/2019  . Environmental and seasonal allergies 10/09/2018  . Sleep concern-difficulty falling asleep 10/09/2018  . Persistent dry cough 09/09/2018  . Head congestion 09/09/2018  . Pes anserinus bursitis 06/21/2018  . Fatigue 06/05/2018  . Abnormal urinalysis 04/15/2018  . Medication course changed 03/05/2018  . 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[place]) 01/31/2018  . Syncope 01/31/2018  . Elevated ALT measurement 12/03/2017  . History of arthroplasty of right knee 06/24/2017  . Gassiness/ belching 06/20/2017  . Muscle spasm of back 05/21/2017  . Obesity, Class III, BMI 40-49.9 (morbid obesity) ([place]) 03/06/2017  . Pre-operative examination 07/31/2016  .  Vaginal mass 07/23/2014  . Acute bronchitis 04/16/2013  . Finger injury 05/30/2012  . Lung nodule seen on imaging study 01/23/2012  . Flank pain 01/22/2012  . Radicular low back pain 01/22/2012  . Back pain 06/15/2011  . Urinary frequency 06/15/2011    Past Medical history, Surgical history, Family history, Social history, Allergies and Medications have been entered into the medical record, reviewed and changed as needed.    Current Meds  Medication Sig  . aspirin EC 81 MG tablet Take 81 mg by mouth daily.  . Cholecalciferol (VITAMIN D3) 1.25 MG (50000 UT) TABS Take 1.25 mg by mouth daily.  . Vitamin D, Ergocalciferol, (DRISDOL) 1.25 MG (50000 UNIT) CAPS capsule One tablet twice weekly  . [DISCONTINUED] carvedilol (COREG) 6.25 MG tablet TAKE 1.5 TABLETs BY MOUTH TWO TIMES DAILY WITH A MEAL  . [DISCONTINUED] Vitamin D, Ergocalciferol, (DRISDOL) 1.25 MG (50000 UT) CAPS capsule One tablet twice weekly    Allergies:  Allergies  Allergen Reactions  . Other Other (See Comments)    IV dye, heart stopped  . Amlodipine Nausea And Vomiting    Dizziness  . Valsartan Rash     Review of Systems:  A fourteen system  review of systems was performed and found to be positive as per HPI.   Objective:   Blood pressure (!) 182/84, pulse 69, temperature 98.4 F (36.9 C), temperature source Oral, resp. rate 14, height 5\' 6"  (1.676 m), weight 247 lb 11.2 oz (112.4 kg), SpO2 96 %. Body mass index is 39.98 kg/m. General:  Well Developed, well nourished, appropriate for stated age.  Neuro:  Alert and oriented,  extra-ocular muscles intact  HEENT:  Normocephalic, atraumatic, neck supple, no carotid bruits appreciated  Skin:  no gross rash, warm, pink. Cardiac:  RRR, S1 S2 Respiratory:  ECTA B/L and A/P, Not using accessory muscles, speaking in full sentences- unlabored. Vascular:  Ext warm, no cyanosis apprec.; cap RF less 2 sec. Psych:  No HI/SI, judgement and insight good, Euthymic mood. Full Affect.

## 2019-06-11 NOTE — Patient Instructions (Addendum)
Purchase sustained release melatonin 3 mg to start, but may increase to 10 mg if needed.  Your goal blood pressure should be 130/80 or less on a regular basis, or medications should be started/ modified.    Normal blood pressure is less than 120/80.    Hypertension Hypertension, commonly called high blood pressure, is when the force of blood pumping through the arteries is too strong. The arteries are the blood vessels that carry blood from the heart throughout the body. Hypertension forces the heart to work harder to pump blood and may cause arteries to become narrow or stiff. Having untreated or uncontrolled hypertension can cause heart attacks, strokes, kidney disease, and other problems. A blood pressure reading consists of a higher number over a lower number. Ideally, your blood pressure should be below 120/80. The first ("top") number is called the systolic pressure. It is a measure of the pressure in your arteries as your heart beats. The second ("bottom") number is called the diastolic pressure. It is a measure of the pressure in your arteries as the heart relaxes. What are the causes? The cause of this condition is not known. What increases the risk? Some risk factors for high blood pressure are under your control. Others are not. Factors you can change  Smoking.  Having type 2 diabetes mellitus, high cholesterol, or both.  Not getting enough exercise or physical activity.  Being overweight.  Having too much fat, sugar, calories, or salt (sodium) in your diet.  Drinking too much alcohol. Factors that are difficult or impossible to change  Having chronic kidney disease.  Having a family history of high blood pressure.  Age. Risk increases with age.  Race. You may be at higher risk if you are African-American.  Gender. Men are at higher risk than women before age 55. After age 54, women are at higher risk than men.  Having obstructive sleep apnea.  Stress. What are the  signs or symptoms? Extremely high blood pressure (hypertensive crisis) may cause:  Headache.  Anxiety.  Shortness of breath.  Nosebleed.  Nausea and vomiting.  Severe chest pain.  Jerky movements you cannot control (seizures).  How is this diagnosed? This condition is diagnosed by measuring your blood pressure while you are seated, with your arm resting on a surface. The cuff of the blood pressure monitor will be placed directly against the skin of your upper arm at the level of your heart. It should be measured at least twice using the same arm. Certain conditions can cause a difference in blood pressure between your right and left arms. Certain factors can cause blood pressure readings to be lower or higher than normal (elevated) for a short period of time:  When your blood pressure is higher when you are in a health care provider's office than when you are at home, this is called white coat hypertension. Most people with this condition do not need medicines.  When your blood pressure is higher at home than when you are in a health care provider's office, this is called masked hypertension. Most people with this condition may need medicines to control blood pressure.  If you have a high blood pressure reading during one visit or you have normal blood pressure with other risk factors:  You may be asked to return on a different day to have your blood pressure checked again.  You may be asked to monitor your blood pressure at home for 1 week or longer.  If you are diagnosed  with hypertension, you may have other blood or imaging tests to help your health care provider understand your overall risk for other conditions. How is this treated? This condition is treated by making healthy lifestyle changes, such as eating healthy foods, exercising more, and reducing your alcohol intake. Your health care provider may prescribe medicine if lifestyle changes are not enough to get your blood  pressure under control, and if:  Your systolic blood pressure is above 130.  Your diastolic blood pressure is above 80.  Your personal target blood pressure may vary depending on your medical conditions, your age, and other factors. Follow these instructions at home: Eating and drinking  Eat a diet that is high in fiber and potassium, and low in sodium, added sugar, and fat. An example eating plan is called the DASH (Dietary Approaches to Stop Hypertension) diet. To eat this way: ? Eat plenty of fresh fruits and vegetables. Try to fill half of your plate at each meal with fruits and vegetables. ? Eat whole grains, such as whole wheat pasta, brown rice, or whole grain bread. Fill about one quarter of your plate with whole grains. ? Eat or drink low-fat dairy products, such as skim milk or low-fat yogurt. ? Avoid fatty cuts of meat, processed or cured meats, and poultry with skin. Fill about one quarter of your plate with lean proteins, such as fish, chicken without skin, beans, eggs, and tofu. ? Avoid premade and processed foods. These tend to be higher in sodium, added sugar, and fat.  Reduce your daily sodium intake. Most people with hypertension should eat less than 1,500 mg of sodium a day.  Limit alcohol intake to no more than 1 drink a day for nonpregnant women and 2 drinks a day for men. One drink equals 12 oz of beer, 5 oz of wine, or 1 oz of hard liquor. Lifestyle  Work with your health care provider to maintain a healthy body weight or to lose weight. Ask what an ideal weight is for you.  Get at least 30 minutes of exercise that causes your heart to beat faster (aerobic exercise) most days of the week. Activities may include walking, swimming, or biking.  Include exercise to strengthen your muscles (resistance exercise), such as pilates or lifting weights, as part of your weekly exercise routine. Try to do these types of exercises for 30 minutes at least 3 days a week.  Do not  use any products that contain nicotine or tobacco, such as cigarettes and e-cigarettes. If you need help quitting, ask your health care provider.  Monitor your blood pressure at home as told by your health care provider.  Keep all follow-up visits as told by your health care provider. This is important. Medicines  Take over-the-counter and prescription medicines only as told by your health care provider. Follow directions carefully. Blood pressure medicines must be taken as prescribed.  Do not skip doses of blood pressure medicine. Doing this puts you at risk for problems and can make the medicine less effective.  Ask your health care provider about side effects or reactions to medicines that you should watch for. Contact a health care provider if:  You think you are having a reaction to a medicine you are taking.  You have headaches that keep coming back (recurring).  You feel dizzy.  You have swelling in your ankles.  You have trouble with your vision. Get help right away if:  You develop a severe headache or confusion.  You  have unusual weakness or numbness.  You feel faint.  You have severe pain in your chest or abdomen.  You vomit repeatedly.  You have trouble breathing. Summary  Hypertension is when the force of blood pumping through your arteries is too strong. If this condition is not controlled, it may put you at risk for serious complications.  Your personal target blood pressure may vary depending on your medical conditions, your age, and other factors. For most people, a normal blood pressure is less than 120/80.  Hypertension is treated with lifestyle changes, medicines, or a combination of both. Lifestyle changes include weight loss, eating a healthy, low-sodium diet, exercising more, and limiting alcohol. This information is not intended to replace advice given to you by your health care provider. Make sure you discuss any questions you have with your health  care provider. Document Released: 05/01/2005 Document Revised: 03/29/2016 Document Reviewed: 03/29/2016 Elsevier Interactive Patient Education  2018 [employer].    How to Take Your Blood Pressure   Blood pressure is a measurement of how strongly your blood is pressing against the walls of your arteries. Arteries are blood vessels that carry blood from your heart throughout your body. Your health care provider takes your blood pressure at each office visit. You can also take your own blood pressure at home with a blood pressure machine. You may need to take your own blood pressure:  To confirm a diagnosis of high blood pressure (hypertension).  To monitor your blood pressure over time.  To make sure your blood pressure medicine is working.  Supplies needed: To take your blood pressure, you will need a blood pressure machine. You can buy a blood pressure machine, or blood pressure monitor, at most drugstores or online. There are several types of home blood pressure monitors. When choosing one, consider the following:  Choose a monitor that has an arm cuff.  Choose a monitor that wraps snugly around your upper arm. You should be able to fit only one finger between your arm and the cuff.  Do not choose a monitor that measures your blood pressure from your wrist or finger.  Your health care provider can suggest a reliable monitor that will meet your needs. How to prepare To get the most accurate reading, avoid the following for 30 minutes before you check your blood pressure:  Drinking caffeine.  Drinking alcohol.  Eating.  Smoking.  Exercising.  Five minutes before you check your blood pressure:  Empty your bladder.  Sit quietly without talking in a dining chair, rather than in a soft couch or armchair.  How to take your blood pressure To check your blood pressure, follow the instructions in the manual that came with your blood pressure monitor. If you have a digital  blood pressure monitor, the instructions may be as follows: 1. Sit up straight. 2. Place your feet on the floor. Do not cross your ankles or legs. 3. Rest your left arm at the level of your heart on a table or desk or on the arm of a chair. 4. Pull up your shirt sleeve. 5. Wrap the blood pressure cuff around the upper part of your left arm, 1 inch (2.5 cm) above your elbow. It is best to wrap the cuff around bare skin. 6. Fit the cuff snugly around your arm. You should be able to place only one finger between the cuff and your arm. 7. Position the cord inside the groove of your elbow. 8. Press the power button.  9. Sit quietly while the cuff inflates and deflates. 10. Read the digital reading on the monitor screen and write it down (record it). 11. Wait 2-3 minutes, then repeat the steps, starting at step 1.  What does my blood pressure reading mean? A blood pressure reading consists of a higher number over a lower number. Ideally, your blood pressure should be below 120/80. The first ("top") number is called the systolic pressure. It is a measure of the pressure in your arteries as your heart beats. The second ("bottom") number is called the diastolic pressure. It is a measure of the pressure in your arteries as the heart relaxes. Blood pressure is classified into four stages. The following are the stages for adults who do not have a short-term serious illness or a chronic condition. Systolic pressure and diastolic pressure are measured in a unit called mm Hg. Normal  Systolic pressure: below 123456.  Diastolic pressure: below 80. Elevated  Systolic pressure: [id number].  Diastolic pressure: below 80. Hypertension stage 1  Systolic pressure: 0000000.  Diastolic pressure: [id number]. Hypertension stage 2  Systolic pressure: [id number] or above.  Diastolic pressure: 90 or above. You can have prehypertension or hypertension even if only the systolic or only the diastolic number in your reading is  higher than normal. Follow these instructions at home:  Check your blood pressure as often as recommended by your health care provider.  Take your monitor to the next appointment with your health care provider to make sure: ? That you are using it correctly. ? That it provides accurate readings.  Be sure you understand what your goal blood pressure numbers are.  Tell your health care provider if you are having any side effects from blood pressure medicine. Contact a health care provider if:  Your blood pressure is consistently high. Get help right away if:  Your systolic blood pressure is higher than 180.  Your diastolic blood pressure is higher than 110. This information is not intended to replace advice given to you by your health care provider. Make sure you discuss any questions you have with your health care provider. Document Released: 10/08/2015 Document Revised: 12/21/2015 Document Reviewed: 10/08/2015 Elsevier Interactive Patient Education  [employer].

## 2019-06-28 ENCOUNTER — Ambulatory Visit: Payer: Medicare Other

## 2019-09-09 ENCOUNTER — Encounter: Payer: Self-pay | Admitting: Family Medicine

## 2019-09-09 ENCOUNTER — Other Ambulatory Visit: Payer: Self-pay

## 2019-09-09 ENCOUNTER — Ambulatory Visit: Payer: Medicare Other

## 2019-09-09 ENCOUNTER — Ambulatory Visit (INDEPENDENT_AMBULATORY_CARE_PROVIDER_SITE_OTHER): Payer: Medicare Other | Admitting: Family Medicine

## 2019-09-09 VITALS — BP 181/78 | HR 68 | Temp 97.4°F | Ht 66.0 in | Wt 245.4 lb

## 2019-09-09 DIAGNOSIS — I10 Essential (primary) hypertension: Secondary | ICD-10-CM

## 2019-09-09 DIAGNOSIS — G4700 Insomnia, unspecified: Secondary | ICD-10-CM | POA: Diagnosis not present

## 2019-09-09 DIAGNOSIS — E559 Vitamin D deficiency, unspecified: Secondary | ICD-10-CM | POA: Diagnosis not present

## 2019-09-09 DIAGNOSIS — Z87898 Personal history of other specified conditions: Secondary | ICD-10-CM | POA: Diagnosis not present

## 2019-09-09 MED ORDER — TRIAMTERENE-HCTZ 37.5-25 MG PO TABS: 1.0000 | ORAL_TABLET | Freq: Every day | ORAL | 1 refills | Status: DC

## 2019-09-09 MED ORDER — CARVEDILOL 12.5 MG PO TABS: 12.5000 mg | ORAL_TABLET | Freq: Two times a day (BID) | ORAL | 1 refills | Status: DC

## 2019-09-09 MED ORDER — TRAZODONE HCL 100 MG PO TABS: 100.0000 mg | ORAL_TABLET | Freq: Every evening | ORAL | 1 refills | Status: DC | PRN

## 2019-09-09 MED ORDER — VITAMIN D (ERGOCALCIFEROL) 1.25 MG (50000 UNIT) PO CAPS: ORAL_CAPSULE | ORAL | 1 refills | Status: DC

## 2019-09-09 NOTE — Patient Instructions (Signed)
How to Increase Your Level of Physical Activity Getting regular physical activity is important for your overall health and well-being. Most people do not get enough exercise. There are easy ways to increase your level of physical activity, even if you have not been very active in the past or if you are just starting out. How can increasing my physical activity affect me? Physical activity has many short-term and long-term benefits. Being active on a regular basis can improve your physical and mental health as well as provide other benefits. Physical health benefits  Helping you lose weight or maintain a healthy weight.  Strengthening your muscles and bones.  Reducing your risk of certain long-term (chronic) diseases, including heart disease, cancer, and diabetes.  Being able to move around more easily and for longer periods of time without getting tired (increased stamina).  Improving your ability to fight off illness (enhanced immunity).  Being able to sleep better.  Helping you stay healthy as you get older, including: ? Helping you stay mobile, or capable of walking and moving around. ? Preventing accidents, such as falls. ? Increasing life expectancy. Mental health benefits  Boosting your mood and improving your self-esteem.  Lowering your chance of having mental health problems, such as depression or anxiety.  Helping you feel good about your body. Other benefits  Finding new sources of fun and enjoyment.  Meeting new people who share a common interest. What steps can I take to be more physically active? Getting started  If you have a chronic illness or have not been active for a while, check with your health care provider about how to get started. Ask your health care provider what activities are safe for you.  Start out slowly. Walking or doing some simple chair exercises is a good place to start, especially if you have not been active before or for a long time.  Set  goals that you can work toward. Ask your health care provider how much exercise is best for you. In general, most adults should: ? Do moderate-intensity exercise for at least 150 minutes each week (30 minutes on most days of the week) or vigorous exercise for at least 75 minutes each week, or a combination of these.  Moderate-intensity exercise can include walking at a quick pace, biking, yoga, water aerobics, or gardening.  Vigorous exercise involves activities that take more effort, such as jogging or running, playing sports, swimming laps, or jumping rope. ? Do strength exercises on at least 2 days each week. This can include weight lifting, body weight exercises, and resistance-band exercises.  Consider using a fitness tracker, such as a mobile phone app or a device worn like a watch, that will count the number of steps you take each day. Many people strive to reach 10,000 steps a day. Choosing activities  Try to find activities that you enjoy. You are more likely to commit to an exercise routine if it does not feel like a chore.  If you have bone or joint problems, choose low-impact exercises, like walking or swimming.  Use these tips for being successful with an exercise plan: ? Find a workout partner for accountability. ? Join a group or class, such as an aerobics class, cycling class, or sports team. ? Make family time active. Go for a walk, bike, or swim. ? Include a variety of exercises each week. Being active in your daily routines Besides your formal exercise plans, you can find ways to do physical activity during your daily  routines, such as:  Walking or biking to work or to the store.  Taking the stairs instead of the elevator.  Parking farther away from the door at work or at the store.  Planning walking meetings.  Walking around while you are on the phone.  Where to find more information  Centers for Disease Control and Prevention:  WorkDashboard.es  President's Council on Fitness, Sports & Nutrition: www.fitness.gov  ChooseMyPlate: FormerBoss.no Contact a health care provider if:  You have headaches, muscle aches, or joint pain.  You feel dizzy or light-headed while exercising.  You faint.  You have chest pain while exercising. Summary  Exercise benefits your mind and body at any age, even if you are just starting out.  If you have a chronic illness or have not been active for a while, check with your health care provider before increasing your physical activity.  Choose activities that are safe and enjoyable for you. Ask your health care provider what activities are safe for you.  Start slowly. Tell your health care provider if you have problems as you start to increase your activity level. This information is not intended to replace advice given to you by your health care provider. Make sure you discuss any questions you have with your health care provider. Document Revised: 02/03/2019 Document Reviewed: 11/25/2018 Elsevier Patient Education  [place].         Your goal blood pressure should be around 150/90 or less on a regular basis.      Hypertension Hypertension, commonly called high blood pressure, is when the force of blood pumping through the arteries is too strong. The arteries are the blood vessels that carry blood from the heart throughout the body. Hypertension forces the heart to work harder to pump blood and may cause arteries to become narrow or stiff. Having untreated or uncontrolled hypertension can cause heart attacks, strokes, kidney disease, and other problems. A blood pressure reading consists of a higher number over a lower number. Ideally, your blood pressure should be below 120/80. The first ("top") number is called the systolic pressure. It is a measure of the pressure in your arteries as your heart beats. The second ("bottom") number is called the  diastolic pressure. It is a measure of the pressure in your arteries as the heart relaxes. What are the causes? The cause of this condition is not known. What increases the risk? Some risk factors for high blood pressure are under your control. Others are not. Factors you can change  Smoking.  Having type 2 diabetes mellitus, high cholesterol, or both.  Not getting enough exercise or physical activity.  Being overweight.  Having too much fat, sugar, calories, or salt (sodium) in your diet.  Drinking too much alcohol. Factors that are difficult or impossible to change  Having chronic kidney disease.  Having a family history of high blood pressure.  Age. Risk increases with age.  Race. You may be at higher risk if you are African-American.  Gender. Men are at higher risk than women before age 61. After age 44, women are at higher risk than men.  Having obstructive sleep apnea.  Stress. What are the signs or symptoms? Extremely high blood pressure (hypertensive crisis) may cause:  Headache.  Anxiety.  Shortness of breath.  Nosebleed.  Nausea and vomiting.  Severe chest pain.  Jerky movements you cannot control (seizures).  How is this diagnosed? This condition is diagnosed by measuring your blood pressure while you are seated, with  your arm resting on a surface. The cuff of the blood pressure monitor will be placed directly against the skin of your upper arm at the level of your heart. It should be measured at least twice using the same arm. Certain conditions can cause a difference in blood pressure between your right and left arms. Certain factors can cause blood pressure readings to be lower or higher than normal (elevated) for a short period of time:  When your blood pressure is higher when you are in a health care provider's office than when you are at home, this is called white coat hypertension. Most people with this condition do not need medicines.  When  your blood pressure is higher at home than when you are in a health care provider's office, this is called masked hypertension. Most people with this condition may need medicines to control blood pressure.  If you have a high blood pressure reading during one visit or you have normal blood pressure with other risk factors:  You may be asked to return on a different day to have your blood pressure checked again.  You may be asked to monitor your blood pressure at home for 1 week or longer.  If you are diagnosed with hypertension, you may have other blood or imaging tests to help your health care provider understand your overall risk for other conditions. How is this treated? This condition is treated by making healthy lifestyle changes, such as eating healthy foods, exercising more, and reducing your alcohol intake. Your health care provider may prescribe medicine if lifestyle changes are not enough to get your blood pressure under control, and if:  Your systolic blood pressure is above 130.  Your diastolic blood pressure is above 80.  Your personal target blood pressure may vary depending on your medical conditions, your age, and other factors. Follow these instructions at home: Eating and drinking  Eat a diet that is high in fiber and potassium, and low in sodium, added sugar, and fat. An example eating plan is called the DASH (Dietary Approaches to Stop Hypertension) diet. To eat this way: ? Eat plenty of fresh fruits and vegetables. Try to fill half of your plate at each meal with fruits and vegetables. ? Eat whole grains, such as whole wheat pasta, brown rice, or whole grain bread. Fill about one quarter of your plate with whole grains. ? Eat or drink low-fat dairy products, such as skim milk or low-fat yogurt. ? Avoid fatty cuts of meat, processed or cured meats, and poultry with skin. Fill about one quarter of your plate with lean proteins, such as fish, chicken without skin, beans,  eggs, and tofu. ? Avoid premade and processed foods. These tend to be higher in sodium, added sugar, and fat.  Reduce your daily sodium intake. Most people with hypertension should eat less than 1,500 mg of sodium a day.  Limit alcohol intake to no more than 1 drink a day for nonpregnant women and 2 drinks a day for men. One drink equals 12 oz of beer, 5 oz of wine, or 1 oz of hard liquor. Lifestyle  Work with your health care provider to maintain a healthy body weight or to lose weight. Ask what an ideal weight is for you.  Get at least 30 minutes of exercise that causes your heart to beat faster (aerobic exercise) most days of the week. Activities may include walking, swimming, or biking.  Include exercise to strengthen your muscles (resistance exercise), such as  pilates or lifting weights, as part of your weekly exercise routine. Try to do these types of exercises for 30 minutes at least 3 days a week.  Do not use any products that contain nicotine or tobacco, such as cigarettes and e-cigarettes. If you need help quitting, ask your health care provider.  Monitor your blood pressure at home as told by your health care provider.  Keep all follow-up visits as told by your health care provider. This is important. Medicines  Take over-the-counter and prescription medicines only as told by your health care provider. Follow directions carefully. Blood pressure medicines must be taken as prescribed.  Do not skip doses of blood pressure medicine. Doing this puts you at risk for problems and can make the medicine less effective.  Ask your health care provider about side effects or reactions to medicines that you should watch for. Contact a health care provider if:  You think you are having a reaction to a medicine you are taking.  You have headaches that keep coming back (recurring).  You feel dizzy.  You have swelling in your ankles.  You have trouble with your vision. Get help right  away if:  You develop a severe headache or confusion.  You have unusual weakness or numbness.  You feel faint.  You have severe pain in your chest or abdomen.  You vomit repeatedly.  You have trouble breathing. Summary  Hypertension is when the force of blood pumping through your arteries is too strong. If this condition is not controlled, it may put you at risk for serious complications.  Your personal target blood pressure may vary depending on your medical conditions, your age, and other factors. For most people, a normal blood pressure is less than 120/80.  Hypertension is treated with lifestyle changes, medicines, or a combination of both. Lifestyle changes include weight loss, eating a healthy, low-sodium diet, exercising more, and limiting alcohol. This information is not intended to replace advice given to you by your health care provider. Make sure you discuss any questions you have with your health care provider. Document Released: 05/01/2005 Document Revised: 03/29/2016 Document Reviewed: 03/29/2016 Elsevier Interactive Patient Education  2018 [employer].    How to Take Your Blood Pressure   Blood pressure is a measurement of how strongly your blood is pressing against the walls of your arteries. Arteries are blood vessels that carry blood from your heart throughout your body. Your health care provider takes your blood pressure at each office visit. You can also take your own blood pressure at home with a blood pressure machine. You may need to take your own blood pressure:  To confirm a diagnosis of high blood pressure (hypertension).  To monitor your blood pressure over time.  To make sure your blood pressure medicine is working.  Supplies needed: To take your blood pressure, you will need a blood pressure machine. You can buy a blood pressure machine, or blood pressure monitor, at most drugstores or online. There are several types of home blood pressure  monitors. When choosing one, consider the following:  Choose a monitor that has an arm cuff.  Choose a monitor that wraps snugly around your upper arm. You should be able to fit only one finger between your arm and the cuff.  Do not choose a monitor that measures your blood pressure from your wrist or finger.  Your health care provider can suggest a reliable monitor that will meet your needs. How to prepare To get the  most accurate reading, avoid the following for 30 minutes before you check your blood pressure:  Drinking caffeine.  Drinking alcohol.  Eating.  Smoking.  Exercising.  Five minutes before you check your blood pressure:  Empty your bladder.  Sit quietly without talking in a dining chair, rather than in a soft couch or armchair.  How to take your blood pressure To check your blood pressure, follow the instructions in the manual that came with your blood pressure monitor. If you have a digital blood pressure monitor, the instructions may be as follows: 1. Sit up straight. 2. Place your feet on the floor. Do not cross your ankles or legs. 3. Rest your left arm at the level of your heart on a table or desk or on the arm of a chair. 4. Pull up your shirt sleeve. 5. Wrap the blood pressure cuff around the upper part of your left arm, 1 inch (2.5 cm) above your elbow. It is best to wrap the cuff around bare skin. 6. Fit the cuff snugly around your arm. You should be able to place only one finger between the cuff and your arm. 7. Position the cord inside the groove of your elbow. 8. Press the power button. 9. Sit quietly while the cuff inflates and deflates. 10. Read the digital reading on the monitor screen and write it down (record it). 11. Wait 2-3 minutes, then repeat the steps, starting at step 1.  What does my blood pressure reading mean? A blood pressure reading consists of a higher number over a lower number. Ideally, your blood pressure should be below 120/80.  The first ("top") number is called the systolic pressure. It is a measure of the pressure in your arteries as your heart beats. The second ("bottom") number is called the diastolic pressure. It is a measure of the pressure in your arteries as the heart relaxes. Blood pressure is classified into four stages. The following are the stages for adults who do not have a short-term serious illness or a chronic condition. Systolic pressure and diastolic pressure are measured in a unit called mm Hg. Normal  Systolic pressure: below 123456.  Diastolic pressure: below 80. Elevated  Systolic pressure: [id number].  Diastolic pressure: below 80. Hypertension stage 1  Systolic pressure: 0000000.  Diastolic pressure: [id number]. Hypertension stage 2  Systolic pressure: [id number] or above.  Diastolic pressure: 90 or above. You can have prehypertension or hypertension even if only the systolic or only the diastolic number in your reading is higher than normal. Follow these instructions at home:  Check your blood pressure as often as recommended by your health care provider.  Take your monitor to the next appointment with your health care provider to make sure: ? That you are using it correctly. ? That it provides accurate readings.  Be sure you understand what your goal blood pressure numbers are.  Tell your health care provider if you are having any side effects from blood pressure medicine. Contact a health care provider if:  Your blood pressure is consistently high. Get help right away if:  Your systolic blood pressure is higher than 180.  Your diastolic blood pressure is higher than 110. This information is not intended to replace advice given to you by your health care provider. Make sure you discuss any questions you have with your health care provider. Document Released: 10/08/2015 Document Revised: 12/21/2015 Document Reviewed: 10/08/2015 Elsevier Interactive Patient Education  [employer].

## 2019-09-09 NOTE — Progress Notes (Signed)
Impression and Recommendations:    1. Essential hypertension   2. Insomnia, unspecified type   3. Severe obesity (BMI 35.0-39.9) with comorbidity ([place])   4. History of prediabetes   5. Vitamin D deficiency     Essential hypertension - Increased dose of coreg last appointment. - Discussed that coreg may cause the heart rate to go lower.  - Blood pressure currently remains elevated. - Per patient, BP at home "stays around 150-something," but goes as high as 170's. - Patient is asymptomatic at this time.  - If BP remains consistently over 150, recommended modification of treatment plan. - Patient will continue current treatment regimen and monitor closely.  See med list.  - To help reduce her blood pressure, encouraged patient to consistently walk 30-45 minutes per day.  - Counseled patient on pathophysiology of disease and discussed various treatment options, which always includes dietary and lifestyle modification as first line.   - Lifestyle changes such as dash and heart healthy diets and engaging in a regular exercise program discussed extensively with patient.   - Ambulatory blood pressure monitoring encouraged at least 3 times weekly.  Keep log and bring in every office visit.  Reminded patient that if they ever feel poorly in any way, to check their blood pressure and pulse.  - We will continue to monitor.  If by next OV, patient's BP is not less than 150/80 on a regular basis, will modify treatment plan.   Insomnia, unspecified type - Last OV, increased dose of trazodone to 100 mg nightly, and added sustained release melatonin.  - Patient agrees to continue treatment plan as established. - Patient will check to make sure she is taking the 100 mg tablet.  See med list.  - Told patient to take her trazodone right at 11 o'clock, about 30 minutes prior to bed. - Advised patient to get into bed and relax soon after taking her trazodone at night. - Prudent sleep  hygiene habits discussed with patient during appointment.  - To help improve her quality of sleep, encouraged patient to exercise to a goal of 30 minutes per day.  - Will continue to monitor.   Vitamin D deficiency - 28.7, low, last check seven months ago, up slightly from 26.9 one year ago.  - Continue Vitamin D supplementation as prescribed.  See med list.  - Will continue to monitor and re-check as discussed.   History of prediabetes - A1c 5.6 last check seven months ago, down from 5.9 one year ago.  - Counseled patient on prevention of diabetes and discussed dietary and lifestyle modification as first line.    - Importance of low carb, heart-healthy diet discussed with patient in addition to regular aerobic exercise of 61min 5d/week or more.   - Will continue to monitor and re-check as discussed.   BMI Counseling - Severe obesity (BMI 35.0-39.9) with comorbidity Explained to patient what BMI refers to, and what it means medically.    Told patient to think about it as a "medical risk stratification measurement" and how increasing BMI is associated with increasing risk/ or worsening state of various diseases such as hypertension, hyperlipidemia, diabetes, premature OA, depression etc.  American Heart Association guidelines for healthy diet, basically Mediterranean diet, and exercise guidelines of 30 minutes 5 days per week or more discussed in detail.  - Encouraged patient to reduce snacking.  Health counseling performed.  All questions answered.   Health Counseling & Preventative Maintenance - Advised patient  to continue working toward exercising to improve overall mental, physical, and emotional health.    - Reviewed the "spokes of the wheel" of wellbeing.  Stressed the importance of ongoing prudent habits, including regular exercise, appropriate sleep hygiene, healthful dietary habits, and prayer/meditation to relax.  - Encouraged patient to engage in daily physical  activity as tolerated, especially a formal exercise routine.  Recommended that the patient eventually strive for at least 150 minutes of moderate cardiovascular activity per week according to guidelines established by the [facility].   - Healthy dietary habits encouraged, including low-carb, and high amounts of lean protein in diet.   - Patient should also consume adequate amounts of water.   Meds ordered this encounter  Medications  . traZODone (DESYREL) 100 MG tablet    Sig: Take 1 tablet (100 mg total) by mouth at bedtime as needed for sleep.    Dispense:  90 tablet    Refill:  1  . carvedilol (COREG) 12.5 MG tablet    Sig: Take 1 tablet (12.5 mg total) by mouth 2 (two) times daily with a meal.    Dispense:  180 tablet    Refill:  1  . triamterene-hydrochlorothiazide (MAXZIDE-25) 37.5-25 MG tablet    Sig: Take 1 tablet by mouth daily.    Dispense:  90 tablet    Refill:  1  . Vitamin D, Ergocalciferol, (DRISDOL) 1.25 MG (50000 UNIT) CAPS capsule    Sig: One tablet twice weekly    Dispense:  24 capsule    Refill:  1    Medications Discontinued During This Encounter  Medication Reason  . Vitamin D, Ergocalciferol, (DRISDOL) 1.25 MG (50000 UNIT) CAPS capsule Reorder  . carvedilol (COREG) 12.5 MG tablet Reorder  . traZODone (DESYREL) 100 MG tablet Reorder  . triamterene-hydrochlorothiazide (MAXZIDE-25) 37.5-25 MG tablet Reorder      Please see AVS handed out to patient at the end of our visit for further patient instructions/ counseling done pertaining to today's office visit.   Return for f/up 3 months with goal BP of less than 150/80 on a regular basis- bring BP/HR log!.     Note:  This note was prepared with assistance of Dragon voice recognition software. Occasional wrong-word or sound-a-like substitutions may have occurred due to the inherent limitations of voice recognition software.   The [place] was signed into law in 2016 which includes the topic of  electronic health records.  This provides immediate access to information in MyChart.  This includes consultation notes, operative notes, office notes, lab results and pathology reports.  If you have any questions about what you read please let us know at your next visit or call us at the office.  We are right here with you.    This case required medical decision making of at least moderate complexity.  This document serves as a record of services personally performed by Mellody Dance, DO. It was created on her behalf by [name], a trained medical scribe. The creation of this record is based on the scribe's personal observations and the provider's statements to them.    The above documentation from [name], medical scribe, has been reviewed by Marjory Sneddon, D.O.  --------------------------------------------------------------------------------------------------------------------------------------------------------------------------------------------------------------------------------------------  Subjective:     Phillips Odor, am serving as scribe for Dr.Antonette Hendricks.   HPI: Kimberly Ferrell is a 79 y.o. female who presents to [place] at [facility] today for issues as discussed below.  Says she uses  very little salt in her diet.  About two weeks ago, she began walking with Josph Macho, for about 25 minutes per walk.  Notes her main issue, along with Josph Macho, is snacking.  Says last night she ate a bunch of grapes with Josph Macho.  She also enjoys eating plain cauliflower and broccoli, sometimes with a vegetable dip she makes at home.  - Sleep Her main concern today is with regards to sleeping.  Says "it takes me forever to go to sleep."  Notes that it's usually around 2 or 4 o'clock in the morning when she goes to sleep.  She continues taking trazodone but is unsure about the dosage.  She may be taking 50 mg tablets instead of 100 mg tablets, but  believes she has been taking the 100 mg tablets as prescribed last OV.  Feels her sleep since the increase in trazodone last OV is "about the same."  Once she falls asleep, she gets up around 8 o'clock AM.  She gets into bed around 11 o'clock PM.  She watches the 11 o'clock news, and as soon as that goes off, she goes to bed.  She usually takes her trazodone around 8:30 PM.  Says it doesn't really make her feel sleepy.  HPI:  Hypertension:  Says overall, "I've been feeling pretty good."  -  Her blood pressure at home has been running: usually around 150-160/50-70.  Her heart rate is usually running around 64, 63.  Says she's never had problems with her heart rate so far.  - Patient reports good compliance with medication and/or lifestyle modification  - Her denies acute concerns or problems related to treatment plan  - She denies new onset of: chest pain, exercise intolerance, shortness of breath, dizziness, visual changes, headache, lower extremity swelling or claudication.    Last 3 blood pressure readings in our office are as follows: BP Readings from Last 3 Encounters:  09/09/19 (!) 181/78  06/11/19 (!) 182/84  03/25/19 (!) 165/84   Filed Weights   09/09/19 1027  Weight: 245 lb 6.4 oz (111.3 kg)       Wt Readings from Last 3 Encounters:  09/09/19 245 lb 6.4 oz (111.3 kg)  06/11/19 247 lb 11.2 oz (112.4 kg)  04/03/19 246 lb (111.6 kg)   BP Readings from Last 3 Encounters:  09/09/19 (!) 181/78  06/11/19 (!) 182/84  03/25/19 (!) 165/84   Pulse Readings from Last 3 Encounters:  09/09/19 68  06/11/19 69  03/25/19 69   BMI Readings from Last 3 Encounters:  09/09/19 39.61 kg/m  06/11/19 39.98 kg/m  04/03/19 39.71 kg/m     Patient Care Team    Relationship Specialty Notifications Start End  Mellody Dance, DO PCP - General Family Medicine  01/31/16   Gaynelle Arabian, MD Consulting Physician Orthopedic Surgery  01/31/16   Druscilla Brownie, MD Consulting  Physician Dermatology  01/31/16   Anastasio Auerbach, MD Consulting Physician Gynecology  02/06/16   Pieter Partridge, [place] Physician Neurology  11/02/16      Patient Active Problem List   Diagnosis Date Noted  . BMI 40.0-44.9, adult ([place]) 11/17/2016  . HTN (hypertension) 06/15/2011  . Glucose intolerance (impaired glucose tolerance) : A1c elevated 11/17/2016  . TIA (transient ischemic attack) 01/31/2016  . Rosacea 06/25/2011  . 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 Vitamin D deficiency 11/17/2016  . GERD (gastroesophageal reflux disease) 11/02/2016  . OA (osteoarthritis) of knee 05/23/2013  . OAB (overactive bladder) 04/16/2013  .  Post-nasal drip 05/30/2012  . Myalgia and myositis 01/30/2012  . History of prediabetes 09/09/2019  . Insomnia 06/11/2019  . At risk for medication noncompliance 06/11/2019  . Environmental and seasonal allergies 10/09/2018  . Sleep concern-difficulty falling asleep 10/09/2018  . Persistent dry cough 09/09/2018  . Head congestion 09/09/2018  . Pes anserinus bursitis 06/21/2018  . Fatigue 06/05/2018  . Abnormal urinalysis 04/15/2018  . Medication course changed 03/05/2018  . 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[place]) 01/31/2018  . Syncope 01/31/2018  . Elevated ALT measurement 12/03/2017  . History of arthroplasty of right knee 06/24/2017  . Gassiness/ belching 06/20/2017  . Muscle spasm of back 05/21/2017  . Obesity, Class III, BMI 40-49.9 (morbid obesity) ([place]) 03/06/2017  . Pre-operative examination 07/31/2016  . Vaginal mass 07/23/2014  . Acute bronchitis 04/16/2013  . Finger injury 05/30/2012  . Lung nodule seen on imaging study 01/23/2012  . Flank pain 01/22/2012  . Radicular low back pain 01/22/2012  . Back pain 06/15/2011  . Urinary frequency 06/15/2011    Past Medical history, Surgical history, Family history, Social history, Allergies and Medications have been entered into the medical record, reviewed and  changed as needed.    Current Meds  Medication Sig  . aspirin EC 81 MG tablet Take 81 mg by mouth daily.  . carvedilol (COREG) 12.5 MG tablet Take 1 tablet (12.5 mg total) by mouth 2 (two) times daily with a meal.  . Cholecalciferol (VITAMIN D3) 1.25 MG (50000 UT) TABS Take 1.25 mg by mouth daily.  . Melatonin 3 MG CAPS 1-3 tabs of the sustained release melatonin OTC per night before bed  . omeprazole (PRILOSEC) 20 MG capsule Take 1 capsule (20 mg total) by mouth daily.  . traZODone (DESYREL) 100 MG tablet Take 1 tablet (100 mg total) by mouth at bedtime as needed for sleep.  Marland Kitchen triamterene-hydrochlorothiazide (MAXZIDE-25) 37.5-25 MG tablet Take 1 tablet by mouth daily.  . Vitamin D, Ergocalciferol, (DRISDOL) 1.25 MG (50000 UNIT) CAPS capsule One tablet twice weekly  . [DISCONTINUED] carvedilol (COREG) 12.5 MG tablet Take 1 tablet (12.5 mg total) by mouth 2 (two) times daily with a meal.  . [DISCONTINUED] traZODone (DESYREL) 100 MG tablet Take 1 tablet (100 mg total) by mouth at bedtime as needed for sleep.  . [DISCONTINUED] triamterene-hydrochlorothiazide (MAXZIDE-25) 37.5-25 MG tablet Take 1 tablet by mouth daily.  . [DISCONTINUED] Vitamin D, Ergocalciferol, (DRISDOL) 1.25 MG (50000 UNIT) CAPS capsule One tablet twice weekly    Allergies:  Allergies  Allergen Reactions  . Other Other (See Comments)    IV dye, heart stopped  . Amlodipine Nausea And Vomiting    Dizziness  . Valsartan Rash     Review of Systems:  A fourteen system review of systems was performed and found to be positive as per HPI.   Objective:   Blood pressure (!) 181/78, pulse 68, temperature (!) 97.4 F (36.3 C), temperature source Oral, height 5\' 6"  (1.676 m), weight 245 lb 6.4 oz (111.3 kg), SpO2 97 %. Body mass index is 39.61 kg/m. General:  Well Developed, well nourished, appropriate for stated age.  Neuro:  Alert and oriented,  extra-ocular muscles intact  HEENT:  Normocephalic, atraumatic, neck supple,  no carotid bruits appreciated  Skin:  no gross rash, warm, pink. Cardiac:  RRR, S1 S2 Respiratory:  ECTA B/L and A/P, Not using accessory muscles, speaking in full sentences- unlabored. Vascular:  Ext warm, no cyanosis apprec.; cap RF less 2 sec. Psych:  No HI/SI, judgement  and insight good, Euthymic mood. Full Affect.

## 2019-11-10 DIAGNOSIS — H5703 Miosis: Secondary | ICD-10-CM | POA: Diagnosis not present

## 2019-11-10 DIAGNOSIS — D3131 Benign neoplasm of right choroid: Secondary | ICD-10-CM | POA: Diagnosis not present

## 2019-11-10 DIAGNOSIS — H25813 Combined forms of age-related cataract, bilateral: Secondary | ICD-10-CM | POA: Diagnosis not present

## 2019-12-10 ENCOUNTER — Encounter: Payer: Self-pay | Admitting: Physician Assistant

## 2019-12-10 ENCOUNTER — Ambulatory Visit (INDEPENDENT_AMBULATORY_CARE_PROVIDER_SITE_OTHER): Payer: Medicare Other | Admitting: Physician Assistant

## 2019-12-10 ENCOUNTER — Other Ambulatory Visit: Payer: Self-pay

## 2019-12-10 VITALS — Temp 98.3°F | Ht 66.0 in | Wt 247.0 lb

## 2019-12-10 DIAGNOSIS — Z87898 Personal history of other specified conditions: Secondary | ICD-10-CM | POA: Diagnosis not present

## 2019-12-10 DIAGNOSIS — Z0000 Encounter for general adult medical examination without abnormal findings: Secondary | ICD-10-CM

## 2019-12-10 DIAGNOSIS — Z13220 Encounter for screening for lipoid disorders: Secondary | ICD-10-CM

## 2019-12-10 DIAGNOSIS — I10 Essential (primary) hypertension: Secondary | ICD-10-CM

## 2019-12-10 DIAGNOSIS — E559 Vitamin D deficiency, unspecified: Secondary | ICD-10-CM

## 2019-12-10 NOTE — Progress Notes (Signed)
Telehealth office visit note for Kimberly Reid, PA-C- at Primary Care at [facility]   I connected with current patient today by telephone and verified that I am speaking with the correct person   . Location of the patient: Home . Location of the provider: Office - This visit type was conducted due to national recommendations for restrictions regarding the COVID-19 Pandemic (e.g. social distancing) in an effort to limit this patient's exposure and mitigate transmission in our community.    - No physical exam could be performed with this format, beyond that communicated to [place] by the patient/ family members as noted.   - Additionally my office staff/ schedulers were to discuss with the patient that there may be a monetary charge related to this service, depending on their medical insurance.  My understanding is that patient understood and consented to proceed.     _________________________________________________________________________________   History of Present Illness: Patient calls in to follow-up on hypertension.  Reports she has been checking her blood pressure at home and her numbers do not fluctuate anymore.  Her BP readings have been from 147-151/70-72.  Her blood pressure machine recently broke and is planning to get a new one.  Also reports feeling much better.  Denies chest pain, palpitations, dizziness, or lower extremity swelling.    GAD 7 : Generalized Anxiety Score 10/09/2018  Nervous, Anxious, on Edge 0  Control/stop worrying 0  Worry too much - different things 0  Trouble relaxing 0  Restless 0  Easily annoyed or irritable 0  Afraid - awful might happen 0  Total GAD 7 Score 0  Anxiety Difficulty Not difficult at all    Depression screen [facility] 2/9 12/10/2019 09/09/2019 06/11/2019 04/03/2019 03/25/2019  Decreased Interest 0 0 0 0 0  Down, Depressed, Hopeless 0 0 0 0 0  PHQ - 2 Score 0 0 0 0 0  Altered sleeping 1 0 0 1 0  Tired, decreased energy 0 0 0 0 0  Change  in appetite 0 0 0 0 0  Feeling bad or failure about yourself  0 0 0 0 0  Trouble concentrating 0 0 0 0 0  Moving slowly or fidgety/restless 0 0 0 0 0  Suicidal thoughts 0 0 0 0 0  PHQ-9 Score 1 0 0 1 0  Difficult doing work/chores Not difficult at all - - Not difficult at all -  Some recent data might be hidden      Impression and Recommendations:     1. Essential hypertension   2. Vitamin D deficiency   3. History of prediabetes   4. Healthcare maintenance   5. Screening for lipid disorders     Essential hypertension: -Unable to obtain BP today due to broken BP machine. -Recommend to obtain new BP device/upper arm cuff to continue ambulatory BP and pulse monitoring. -Continue carvedilol 12.5 mg and triamterene-HCTZ 37.5-25 mg -Follow DASH diet -Stay well hydrated, at least 64 fl oz  Healthcare maintenance: -Place future lab orders to repeat blood work. -Continue trazodone as needed for insomnia and vitamin D supplementation. -Follow-up in 3 months for regular OV: HTN, insomnia, vitamin D deficiency pre-DM   - As part of my medical decision making, I reviewed the following data within the [place] History obtained from pt /family, CMA notes reviewed and incorporated if applicable, Labs reviewed, Radiograph/ tests reviewed if applicable and OV notes from prior OV's with me, as well as any other specialists she/he  has seen since seeing me last, were all reviewed and used in my medical decision making process today.    - Additionally, when appropriate, discussion had with patient regarding our treatment plan, and their biases/concerns about that plan were used in my medical decision making today.    - The patient agreed with the plan and demonstrated an understanding of the instructions.   No barriers to understanding were identified.     - The patient was advised to call back or seek an in-person evaluation if the symptoms worsen or if the condition fails to  improve as anticipated.   Return in about 3 months (around 03/11/2020) for HTN, Insomnia, Vit D def and FBW few days prior to OV .    Orders Placed This Encounter  Procedures  . CBC w/Diff  . Comp Met (CMET)  . Lipid Profile  . Vitamin D (25 hydroxy)  . HgB A1c    No orders of the defined types were placed in this encounter.   Medications Discontinued During This Encounter  Medication Reason  . omeprazole (PRILOSEC) 20 MG capsule No longer needed (for PRN medications)       Time spent on visit including pre-visit chart review and post-visit care was 15 minutes.      The [place] was signed into law in 2016 which includes the topic of electronic health records.  This provides immediate access to information in MyChart.  This includes consultation notes, operative notes, office notes, lab results and pathology reports.  If you have any questions about what you read please let us know at your next visit or call us at the office.  We are right here with you.   __________________________________________________________________________________     Patient Care Team    Relationship Specialty Notifications Start End  Kimberly Ferrell, [place] PCP - General   09/14/19   Gaynelle Arabian, MD Consulting Physician Orthopedic Surgery  01/31/16   Druscilla Brownie, MD Consulting Physician Dermatology  01/31/16   Anastasio Auerbach, MD (Inactive) Consulting Physician Gynecology  02/06/16   Pieter Partridge, DO Consulting Physician Neurology  11/02/16      -Vitals obtained; medications/ allergies reconciled;  personal medical, social, Sx etc.histories were updated by CMA, reviewed by me and are reflected in chart   Patient Active Problem List   Diagnosis Date Noted  . History of prediabetes 09/09/2019  . Insomnia 06/11/2019  . At risk for medication noncompliance 06/11/2019  . Environmental and seasonal allergies 10/09/2018  . Sleep concern-difficulty falling asleep  10/09/2018  . Persistent dry cough 09/09/2018  . Head congestion 09/09/2018  . Pes anserinus bursitis 06/21/2018  . Fatigue 06/05/2018  . Abnormal urinalysis 04/15/2018  . Medication course changed 03/05/2018  . 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[place]) 01/31/2018  . Syncope 01/31/2018  . 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 Elevated ALT measurement 12/03/2017  . History of arthroplasty of right knee 06/24/2017  . Gassiness/ belching 06/20/2017  . Muscle spasm of back 05/21/2017  . Obesity, Class III, BMI 40-49.9 (morbid obesity) ([place]) 03/06/2017  . Glucose intolerance (impaired glucose tolerance) : A1c elevated 11/17/2016  . BMI 40.0-44.9, adult ([place]) 11/17/2016  . Vitamin D deficiency 11/17/2016  . GERD (gastroesophageal reflux disease) 11/02/2016  . Pre-operative examination 07/31/2016  . TIA (transient ischemic attack) 01/31/2016  . Vaginal mass 07/23/2014  . OA (osteoarthritis) of knee 05/23/2013  . OAB (overactive bladder) 04/16/2013  . Acute bronchitis 04/16/2013  . Post-nasal drip 05/30/2012  .  Finger injury 05/30/2012  . Myalgia and myositis 01/30/2012  . Lung nodule seen on imaging study 01/23/2012  . Flank pain 01/22/2012  . Radicular low back pain 01/22/2012  . Rosacea 06/25/2011  . HTN (hypertension) 06/15/2011  . Back pain 06/15/2011  . Urinary frequency 06/15/2011     Current Meds  Medication Sig  . aspirin EC 81 MG tablet Take 81 mg by mouth daily.  . carvedilol (COREG) 12.5 MG tablet Take 1 tablet (12.5 mg total) by mouth 2 (two) times daily with a meal.  . Cholecalciferol (VITAMIN D3) 1.25 MG (50000 UT) TABS Take 1.25 mg by mouth daily.  . Melatonin 3 MG CAPS 1-3 tabs of the sustained release melatonin OTC per night before bed  . traZODone (DESYREL) 100 MG tablet Take 1 tablet (100 mg total) by mouth at bedtime as needed for sleep.  Marland Kitchen triamterene-hydrochlorothiazide (MAXZIDE-25) 37.5-25 MG tablet Take 1 tablet by mouth daily.  .  Vitamin D, Ergocalciferol, (DRISDOL) 1.25 MG (50000 UNIT) CAPS capsule One tablet twice weekly     Allergies:  Allergies  Allergen Reactions  . Other Other (See Comments)    IV dye, heart stopped  . Amlodipine Nausea And Vomiting    Dizziness  . Valsartan Rash     ROS:  See above HPI for pertinent positives and negatives   Objective:   Temperature 98.3 F (36.8 C), temperature source Temporal, height '5\' 6"'$  (1.676 m), weight (!) 247 lb (112 kg).  (if some vitals are omitted, this means that patient was UNABLE to obtain them even though they were asked to get them prior to OV today.  They were asked to call us at their earliest convenience with these once obtained. ) General: A & O * 3; sounds in no acute distress; in usual state of health.  Respiratory: speaking in full sentences, no conversational dyspnea;  Psych: insight appears good, mood- appears full

## 2019-12-11 ENCOUNTER — Other Ambulatory Visit: Payer: Self-pay | Admitting: Family Medicine

## 2019-12-11 DIAGNOSIS — Z1231 Encounter for screening mammogram for malignant neoplasm of breast: Secondary | ICD-10-CM

## 2020-01-12 ENCOUNTER — Other Ambulatory Visit: Payer: Self-pay

## 2020-01-12 ENCOUNTER — Ambulatory Visit
Admission: RE | Admit: 2020-01-12 | Discharge: 2020-01-12 | Disposition: A | Discharge status: Home / Self Care | Payer: Medicare Other | Source: Ambulatory Visit | Attending: Family Medicine | Admitting: Family Medicine

## 2020-01-12 DIAGNOSIS — Z1231 Encounter for screening mammogram for malignant neoplasm of breast: Secondary | ICD-10-CM

## 2020-01-14 ENCOUNTER — Other Ambulatory Visit: Payer: Self-pay | Admitting: Family Medicine

## 2020-01-14 DIAGNOSIS — R928 Other abnormal and inconclusive findings on diagnostic imaging of breast: Secondary | ICD-10-CM

## 2020-01-18 IMAGING — MG DIGITAL SCREENING BILATERAL MAMMOGRAM WITH TOMO AND CAD
6 of 12 series · 6 of 36 positions shown · non-contrast
Comparison: Previous exam(s).

CLINICAL DATA: Screening.

EXAM:
DIGITAL SCREENING BILATERAL MAMMOGRAM WITH TOMO AND CAD

[L CC synth-2D]
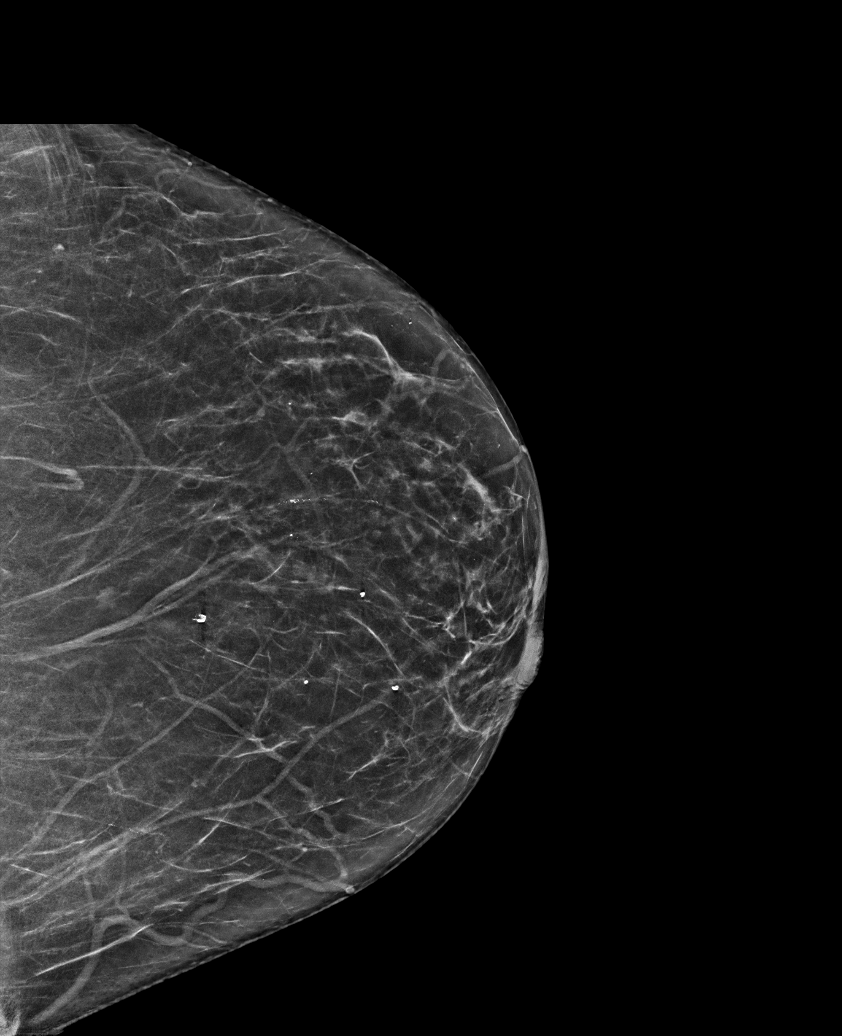

[R CC synth-2D]
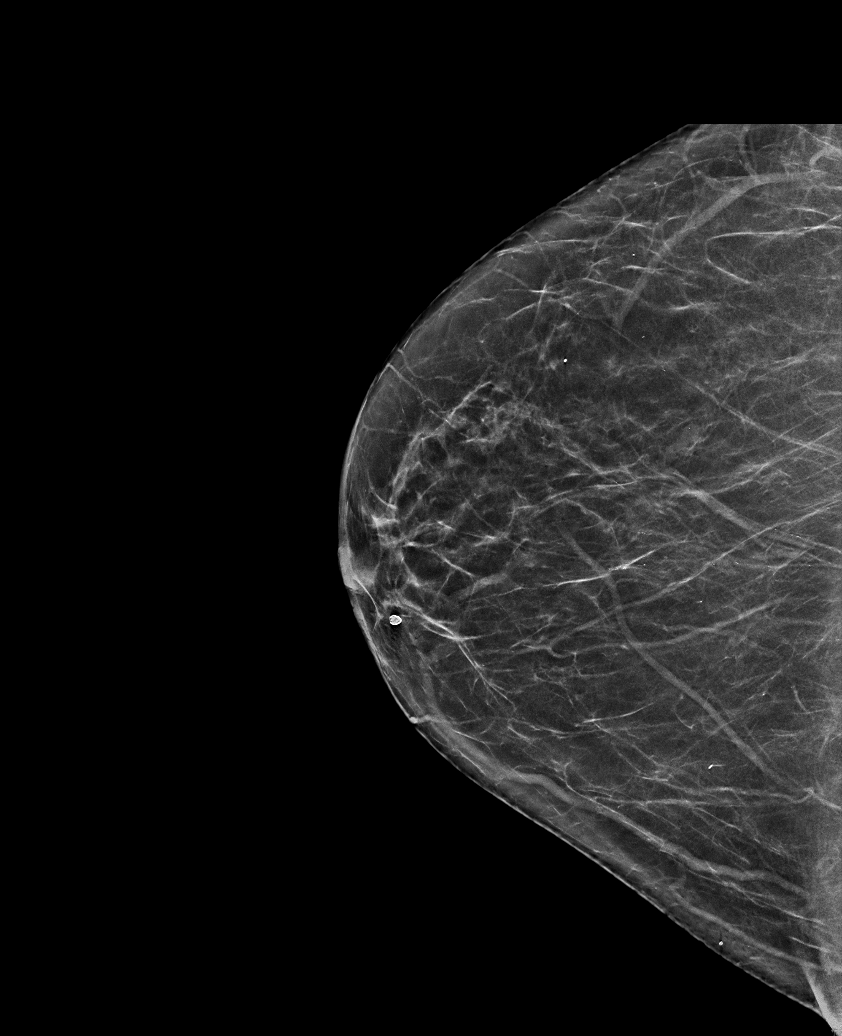

[R MLO synth-2D (1 of 2)]
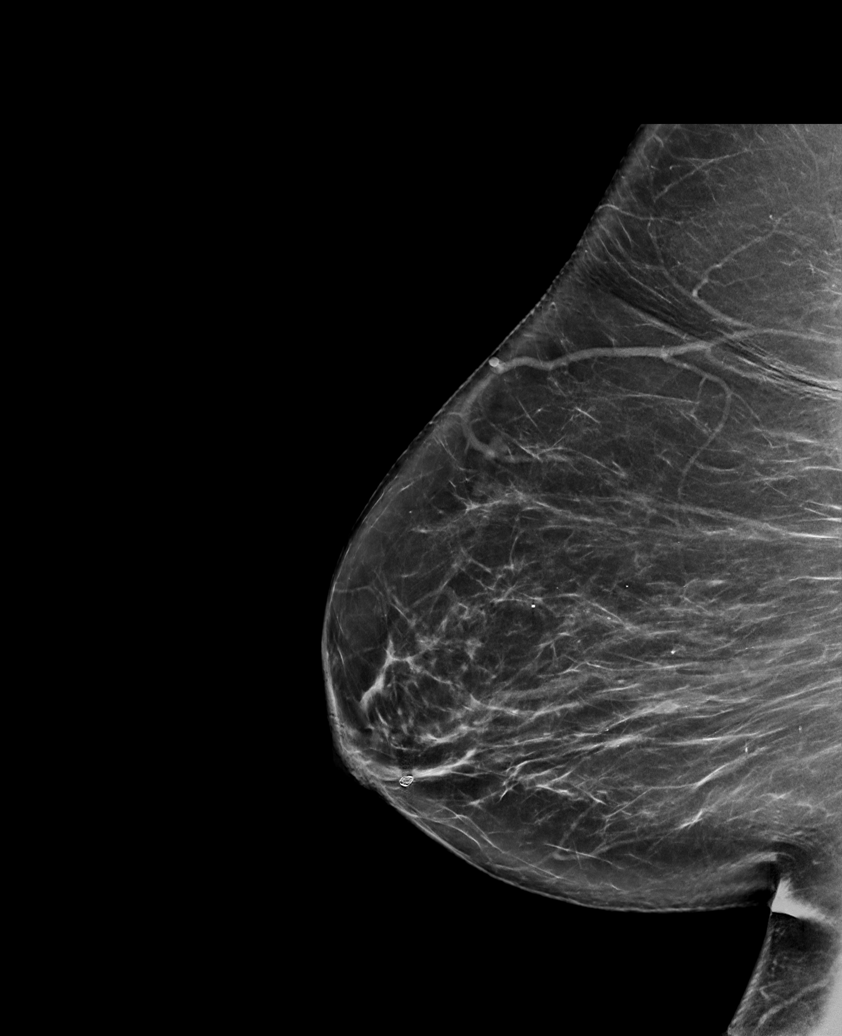

[L CV synth-2D]
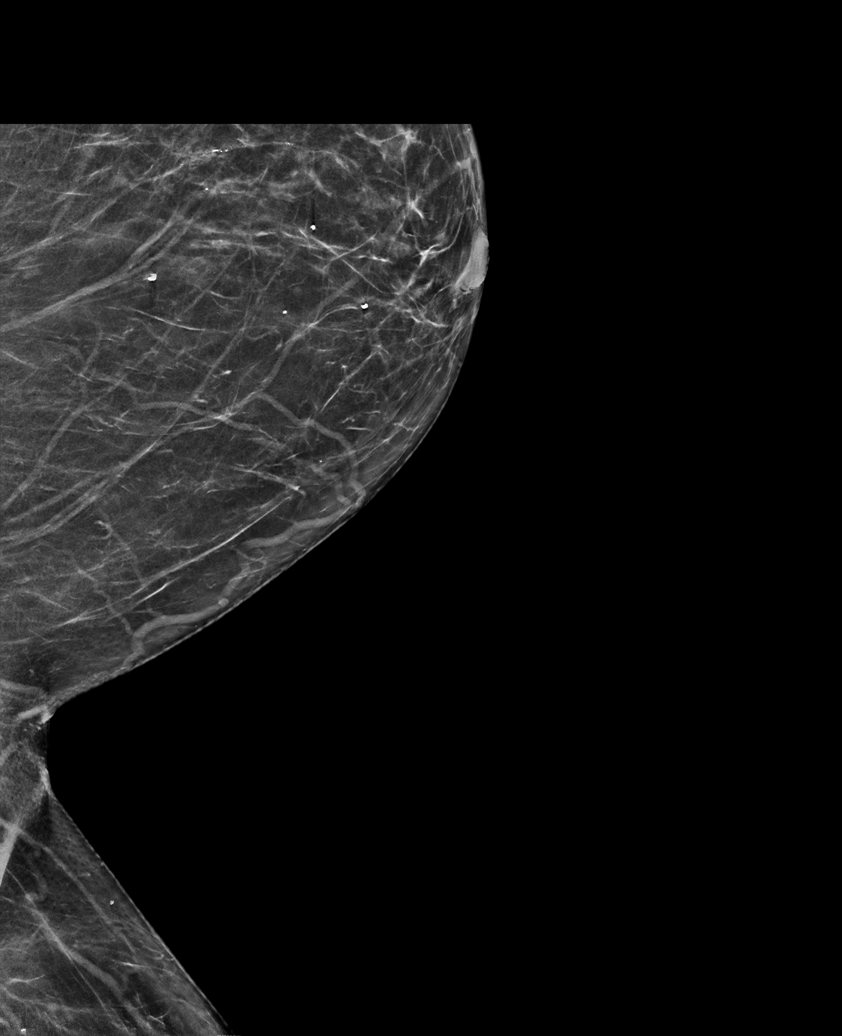

[R MLO synth-2D (2 of 2)]
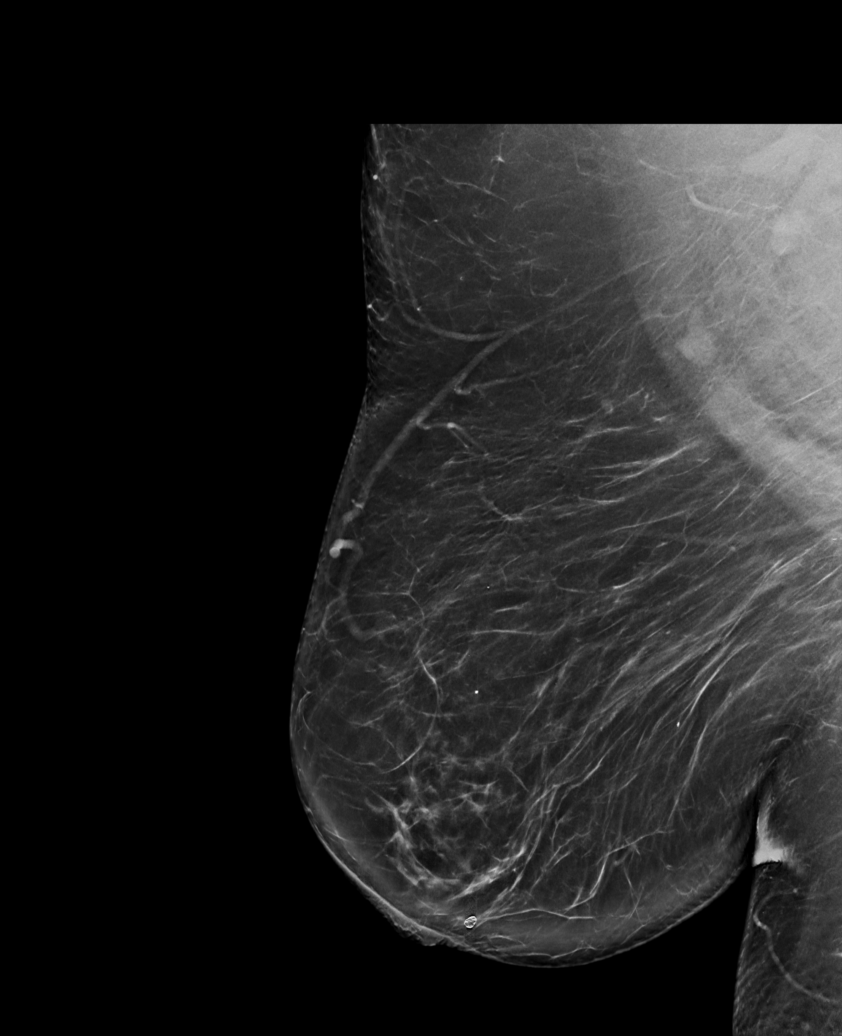

[L MLO synth-2D]
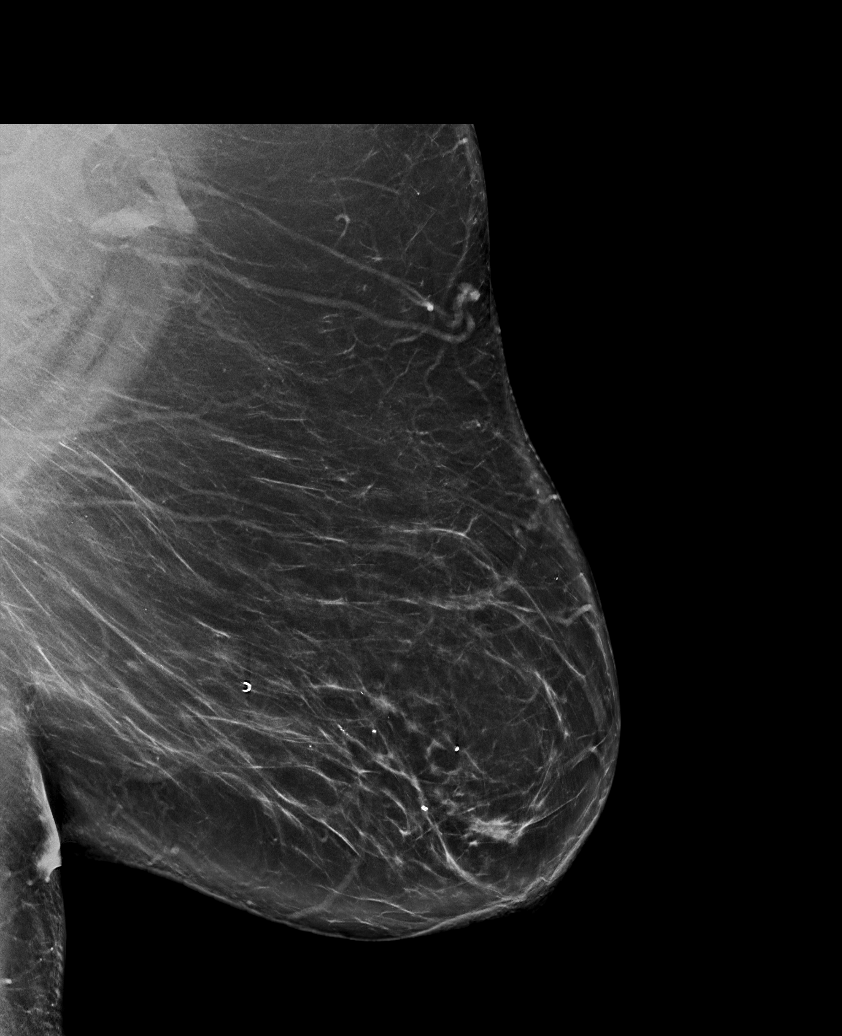

[6 of 36 positions shown; findings below may reference images not displayed]

ACR Breast Density Category b: There are scattered areas of
fibroglandular density.
FINDINGS: There are no findings suspicious for malignancy. Images were
processed with CAD.
IMPRESSION: No mammographic evidence of malignancy. A result letter of this
screening mammogram will be mailed directly to the patient.

RECOMMENDATION:
Screening mammogram in one year. (Code:[id number])

BI-RADS CATEGORY  1: Negative.

## 2020-01-26 ENCOUNTER — Other Ambulatory Visit: Payer: Self-pay

## 2020-01-26 ENCOUNTER — Ambulatory Visit
Admission: RE | Admit: 2020-01-26 | Discharge: 2020-01-26 | Disposition: A | Discharge status: Home / Self Care | Payer: Medicare Other | Source: Ambulatory Visit | Attending: Family Medicine | Admitting: Family Medicine

## 2020-01-26 ENCOUNTER — Other Ambulatory Visit: Payer: Self-pay | Admitting: Internal Medicine

## 2020-01-26 DIAGNOSIS — R928 Other abnormal and inconclusive findings on diagnostic imaging of breast: Secondary | ICD-10-CM

## 2020-01-26 DIAGNOSIS — N6001 Solitary cyst of right breast: Secondary | ICD-10-CM | POA: Diagnosis not present

## 2020-02-05 ENCOUNTER — Other Ambulatory Visit: Payer: Self-pay

## 2020-02-05 ENCOUNTER — Ambulatory Visit: Payer: Medicare Other | Admitting: Physician Assistant

## 2020-02-20 ENCOUNTER — Other Ambulatory Visit: Payer: Medicare Other

## 2020-02-20 ENCOUNTER — Other Ambulatory Visit: Payer: Self-pay

## 2020-02-20 DIAGNOSIS — I10 Essential (primary) hypertension: Secondary | ICD-10-CM | POA: Diagnosis not present

## 2020-02-20 DIAGNOSIS — E559 Vitamin D deficiency, unspecified: Secondary | ICD-10-CM | POA: Diagnosis not present

## 2020-02-20 DIAGNOSIS — Z0000 Encounter for general adult medical examination without abnormal findings: Secondary | ICD-10-CM | POA: Diagnosis not present

## 2020-02-20 DIAGNOSIS — Z87898 Personal history of other specified conditions: Secondary | ICD-10-CM | POA: Diagnosis not present

## 2020-02-20 DIAGNOSIS — Z13220 Encounter for screening for lipoid disorders: Secondary | ICD-10-CM

## 2020-02-21 LAB — COMPREHENSIVE METABOLIC PANEL
ALT: 28 IU/L (ref 0–32)
AST: 31 IU/L (ref 0–40)
Albumin/Globulin Ratio: 1.3 (ref 1.2–2.2)
Albumin: 4 g/dL (ref 3.7–4.7)
Alkaline Phosphatase: 84 IU/L (ref 44–121)
BUN/Creatinine Ratio: 13 (ref 12–28)
BUN: 8 mg/dL (ref 8–27)
Bilirubin Total: 0.6 mg/dL (ref 0.0–1.2)
CO2: 22 mmol/L (ref 20–29)
Calcium: 9.1 mg/dL (ref 8.7–10.3)
Chloride: 107 mmol/L — ABNORMAL HIGH (ref 96–106)
Creatinine, Ser: 0.61 mg/dL (ref 0.57–1.00)
GFR calc Af Amer: 100 mL/min/{1.73_m2} (ref >59)
GFR calc non Af Amer: 87 mL/min/{1.73_m2} (ref >59)
Globulin, Total: 3.1 g/dL (ref 1.5–4.5)
Glucose: 110 mg/dL — ABNORMAL HIGH (ref 65–99)
Potassium: 3.7 mmol/L (ref 3.5–5.2)
Sodium: 143 mmol/L (ref 134–144)
Total Protein: 7.1 g/dL (ref 6.0–8.5)

## 2020-02-21 LAB — HEMOGLOBIN A1C
Est. average glucose Bld gHb Est-mCnc: 120 mg/dL
Hgb A1c MFr Bld: 5.8 % — ABNORMAL HIGH (ref 4.8–5.6)

## 2020-02-21 LAB — CBC WITH DIFFERENTIAL/PLATELET
Basophils Absolute: 0 10*3/uL (ref 0.0–0.2)
Basos: 0 %
EOS (ABSOLUTE): 0.2 10*3/uL (ref 0.0–0.4)
Eos: 4 %
Hematocrit: 41.4 % (ref 34.0–46.6)
Hemoglobin: 14.1 g/dL (ref 11.1–15.9)
Immature Grans (Abs): 0 10*3/uL (ref 0.0–0.1)
Immature Granulocytes: 0 %
Lymphocytes Absolute: 2.4 10*3/uL (ref 0.7–3.1)
Lymphs: 44 %
MCH: 31.5 pg (ref 26.6–33.0)
MCHC: 34.1 g/dL (ref 31.5–35.7)
MCV: 93 fL (ref 79–97)
Monocytes Absolute: 0.6 10*3/uL (ref 0.1–0.9)
Monocytes: 11 %
Neutrophils Absolute: 2.3 10*3/uL (ref 1.4–7.0)
Neutrophils: 41 %
Platelets: 166 10*3/uL (ref 150–450)
RBC: 4.47 x10E6/uL (ref 3.77–5.28)
RDW: 13 % (ref 11.7–15.4)
WBC: 5.5 10*3/uL (ref 3.4–10.8)

## 2020-02-21 LAB — LIPID PANEL
Chol/HDL Ratio: 3.1 ratio (ref 0.0–4.4)
Cholesterol, Total: 131 mg/dL (ref 100–199)
HDL: 42 mg/dL (ref >39)
LDL Chol Calc (NIH): 74 mg/dL (ref 0–99)
Triglycerides: 78 mg/dL (ref 0–149)
VLDL Cholesterol Cal: 15 mg/dL (ref 5–40)

## 2020-02-21 LAB — VITAMIN D 25 HYDROXY (VIT D DEFICIENCY, FRACTURES): Vit D, 25-Hydroxy: 28 ng/mL — ABNORMAL LOW (ref 30.0–100.0)

## 2020-02-25 ENCOUNTER — Other Ambulatory Visit: Payer: Self-pay | Admitting: Physician Assistant

## 2020-02-25 ENCOUNTER — Other Ambulatory Visit: Payer: Self-pay | Admitting: Family Medicine

## 2020-03-08 DIAGNOSIS — R051 Acute cough: Secondary | ICD-10-CM | POA: Diagnosis not present

## 2020-03-08 DIAGNOSIS — R0981 Nasal congestion: Secondary | ICD-10-CM | POA: Diagnosis not present

## 2020-03-08 DIAGNOSIS — Z1159 Encounter for screening for other viral diseases: Secondary | ICD-10-CM | POA: Diagnosis not present

## 2020-03-08 DIAGNOSIS — R053 Chronic cough: Secondary | ICD-10-CM | POA: Diagnosis not present

## 2020-03-08 DIAGNOSIS — R5383 Other fatigue: Secondary | ICD-10-CM | POA: Diagnosis not present

## 2020-03-11 ENCOUNTER — Encounter: Payer: Self-pay | Admitting: Physician Assistant

## 2020-03-11 ENCOUNTER — Ambulatory Visit (INDEPENDENT_AMBULATORY_CARE_PROVIDER_SITE_OTHER): Payer: Medicare Other | Admitting: Physician Assistant

## 2020-03-11 ENCOUNTER — Other Ambulatory Visit: Payer: Self-pay

## 2020-03-11 VITALS — BP 174/84 | HR 64 | Ht 66.0 in | Wt 234.7 lb

## 2020-03-11 DIAGNOSIS — R053 Chronic cough: Secondary | ICD-10-CM

## 2020-03-11 DIAGNOSIS — I10 Essential (primary) hypertension: Secondary | ICD-10-CM | POA: Diagnosis not present

## 2020-03-11 DIAGNOSIS — G4700 Insomnia, unspecified: Secondary | ICD-10-CM

## 2020-03-11 DIAGNOSIS — R7302 Impaired glucose tolerance (oral): Secondary | ICD-10-CM | POA: Diagnosis not present

## 2020-03-11 DIAGNOSIS — E559 Vitamin D deficiency, unspecified: Secondary | ICD-10-CM | POA: Diagnosis not present

## 2020-03-11 MED ORDER — BENZONATATE 100 MG PO CAPS: 100.0000 mg | ORAL_CAPSULE | Freq: Three times a day (TID) | ORAL | 0 refills | Status: DC | PRN

## 2020-03-11 MED ORDER — TRIAMTERENE-HCTZ 75-50 MG PO TABS: 1.0000 | ORAL_TABLET | Freq: Every day | ORAL | 0 refills | Status: DC

## 2020-03-11 NOTE — Assessment & Plan Note (Signed)
-  Recent A1c 5.8, mildly increased from prior. -Advised patient to reduce sweets and monitor carbohydrates. -Continue to stay as active as possible. -Will continue to monitor.

## 2020-03-11 NOTE — Assessment & Plan Note (Addendum)
-  BP above goal and discussed with patient adjusting medication regimen.  Patient has documented allergies for valsartan and amlodipine so will increase triamterene-HCTZ.  Patient verbalized understanding and is agreeable. Continue carvedilol. Recent CMP wnl -Recommend to purchase an upper arm blood pressure cuff/device for more accurate readings. -Follow low-sodium diet. -Continue good hydration. -Will continue to monitor. -Advised patient to schedule nurse/lab visit for repeat CMP due to medication increase and BP recheck.

## 2020-03-11 NOTE — Assessment & Plan Note (Signed)
-  Stable -Continue current medication regimen. -Continue to limit caffeine and establish a good sleep hygiene.

## 2020-03-11 NOTE — Assessment & Plan Note (Signed)
-  Recent vitamin D mildly low at 28.0 -Recommend to start taking vitamin D3 2000 units daily. -Will continue to monitor.

## 2020-03-11 NOTE — Progress Notes (Signed)
Established Patient Office Visit  Subjective:  Patient ID: Kimberly Ferrell, female    DOB: Oct 15, 1940  Age: 79 y.o. MRN: [id number]  CC:  Chief Complaint  Patient presents with   Hypertension   Insomnia    HPI Kimberly Ferrell presents for follow up on hypertension, insomnia and vitamin D deficiency. Also discuss recent lab results. Has complaints today of a chronic persistent cough.  States cough is dry, worse in the mornings and has not been able to identify any specific triggers.  Denies congestion, heartburn/reflux symptoms, seasonal allergies, weight loss, globus sensation or hoarseness.  About 2 to 3 weeks ago took some cold medicine to help with upper respiratory symptoms which have improved but felt it made her cough worse.  HTN: Pt denies chest pain, palpitations, dizziness, shortness of breath, or lower extremity swelling. Taking medication as directed without side effects.  Has been checking blood pressure at home with a wrist cuff and does not feel it is accurate because some of her readings will be really high or really low.  Pt tries to monitor sodium but could do better.  Reports good hydration.  Insomnia: Taking trazodone as needed for sleep which does help.  Has no concerns.  Vitamin D def: Reports has not been taking weekly vitamin D supplement due to a bad aftertaste.  Past Medical History:  Diagnosis Date   Arthritis    Cancer ([place])    skin cancer removed from right forehead   Chicken pox as child   Colon polyps    Complication of anesthesia    "hard to wake up"   GERD (gastroesophageal reflux disease)    occasional   Hemorrhoids    "sometimes"   Hypertension    Lung nodule seen on imaging study    noted 8/13- needs CT f/u in 1 year   Mononucleosis    Pneumonia    hx of   PONV (postoperative nausea and vomiting)    Rosacea    TIA (transient ischemic attack) 2007   from IV dye, no residual, passed out and was told by RN that it was  TIA    Past Surgical History:  Procedure Laterality Date   ABDOMINAL HYSTERECTOMY  1993   BREAST BIOPSY Left 2004   [place]    [place]  1960's   PARTIAL KNEE ARTHROPLASTY Right 08/09/2016   Procedure: RIGHT UNICOMPARTMENTAL KNEE;  Surgeon: Gaynelle Arabian, MD;  Location: [facility];  Service: Orthopedics;  Laterality: Right;  requests 1hr; Adductor Block   TOTAL KNEE ARTHROPLASTY Left 05/23/2013   Procedure: LEFT TOTAL KNEE ARTHROPLASTY;  Surgeon: Gearlean Alf, MD;  Location: [facility];  Service: Orthopedics;  Laterality: Left;    Family History  Problem Relation Age of Onset   Cancer Mother 83       colon   Sudden death Father 65       suicide   Stroke Maternal Grandmother 78   Hypertension Neg Hx    Hyperlipidemia Neg Hx    Heart attack Neg Hx    Diabetes Neg Hx    Breast cancer Neg Hx     Social History   Socioeconomic History   Marital status: Married    Spouse name: Not on file   Number of children: Not on file   Years of education: Not on file   Highest education level: Not on file  Occupational History   Not on file  Tobacco Use  Smoking status: Never Smoker   Smokeless tobacco: Never Used  Substance and Sexual Activity   Alcohol use: No   Drug use: No   Sexual activity: Not Currently  Other Topics Concern   Not on file  Social History Narrative   Not on file   Social Determinants of Health   Financial Resource Strain:    Difficulty of Paying Living Expenses: Not on file  Food Insecurity:    Worried About [place] in the Last Year: Not on file   Ran Out of Food in the Last Year: Not on file  Transportation Needs:    Lack of Transportation (Medical): Not on file   Lack of Transportation (Non-Medical): Not on file  Physical Activity:    Days of Exercise per Week: Not on file   Minutes of Exercise per Session: Not on file  Stress:    Feeling of Stress : Not on file  Social  Connections:    Frequency of Communication with Friends and Family: Not on file   Frequency of Social Gatherings with Friends and Family: Not on file   Attends Religious Services: Not on file   Active Member of Clubs or Organizations: Not on file   Attends Archivist Meetings: Not on file   Marital Status: Not on file  Intimate Partner Violence:    Fear of Current or Ex-Partner: Not on file   Emotionally Abused: Not on file   Physically Abused: Not on file   Sexually Abused: Not on file    Outpatient Medications Prior to Visit  Medication Sig Dispense Refill   aspirin EC 81 MG tablet Take 81 mg by mouth daily.     carvedilol (COREG) 12.5 MG tablet Take 1 tablet (12.5 mg total) by mouth 2 (two) times daily with a meal. 180 tablet 1   Cholecalciferol (VITAMIN D3) 1.25 MG (50000 UT) TABS Take 1.25 mg by mouth daily.     Melatonin 3 MG CAPS 1-3 tabs of the sustained release melatonin OTC per night before bed  0   traZODone (DESYREL) 100 MG tablet Take 1 tablet (100 mg total) by mouth at bedtime as needed for sleep. 90 tablet 1   Vitamin D, Ergocalciferol, (DRISDOL) 1.25 MG (50000 UNIT) CAPS capsule One tablet twice weekly 24 capsule 1   triamterene-hydrochlorothiazide (MAXZIDE-25) 37.5-25 MG tablet Take 1 tablet by mouth daily. 90 tablet 1   No facility-administered medications prior to visit.    Allergies  Allergen Reactions   Other Other (See Comments)    IV dye, heart stopped   Amlodipine Nausea And Vomiting    Dizziness   Valsartan Rash    ROS Review of Systems A fourteen system review of systems was performed and found to be positive as per HPI.   Objective:    Physical Exam General:  Well Developed, well nourished, appropriate for stated age.  Neuro:  Alert and oriented,  extra-ocular muscles intact  HEENT:  Normocephalic, atraumatic, neck supple Skin:  no gross rash, warm, pink. Cardiac:  RRR, S1 S2 Respiratory:  ECTA B/L and A/P, Not  using accessory muscles, speaking in full sentences- unlabored. Vascular:  Ext warm, no cyanosis apprec.; cap RF less 2 sec. Psych:  No HI/SI, judgement and insight good, Euthymic mood. Full Affect.   BP (!) 174/84    Pulse 64    Ht 5\' 6"  (1.676 m)    Wt 234 lb 11.2 oz (106.5 kg)    SpO2 94%  BMI 37.88 kg/m  Wt Readings from Last 3 Encounters:  03/11/20 234 lb 11.2 oz (106.5 kg)  12/10/19 (!) 247 lb (112 kg)  09/09/19 245 lb 6.4 oz (111.3 kg)     Health Maintenance Due  Topic Date Due   Hepatitis C Screening  Never done   COVID-19 Vaccine (1) Never done   INFLUENZA VACCINE  12/14/2019    There are no preventive care reminders to display for this patient.  Lab Results  Component Value Date   TSH 1.020 06/05/2018   Lab Results  Component Value Date   WBC 5.5 02/20/2020   HGB 14.1 02/20/2020   HCT 41.4 02/20/2020   MCV 93 02/20/2020   PLT 166 02/20/2020   Lab Results  Component Value Date   NA 143 02/20/2020   K 3.7 02/20/2020   CO2 22 02/20/2020   GLUCOSE 110 (H) 02/20/2020   BUN 8 02/20/2020   CREATININE 0.61 02/20/2020   BILITOT 0.6 02/20/2020   ALKPHOS 84 02/20/2020   AST 31 02/20/2020   ALT 28 02/20/2020   PROT 7.1 02/20/2020   ALBUMIN 4.0 02/20/2020   CALCIUM 9.1 02/20/2020   ANIONGAP 9 08/10/2016   GFR 81.93 04/16/2013   Lab Results  Component Value Date   CHOL 131 02/20/2020   Lab Results  Component Value Date   HDL 42 02/20/2020   Lab Results  Component Value Date   LDLCALC 74 02/20/2020   Lab Results  Component Value Date   TRIG 78 02/20/2020   Lab Results  Component Value Date   CHOLHDL 3.1 02/20/2020   Lab Results  Component Value Date   HGBA1C 5.8 (H) 02/20/2020      Assessment & Plan:   Problem List Items Addressed This Visit      Cardiovascular and Mediastinum   HTN (hypertension) (Chronic)    -BP above goal and discussed with patient adjusting medication regimen.  Patient has documented allergies for valsartan and  amlodipine so will increase triamterene-HCTZ.  Patient verbalized understanding and is agreeable. Continue carvedilol. Recent CMP wnl -Recommend to purchase an upper arm blood pressure cuff/device for more accurate readings. -Follow low-sodium diet. -Continue good hydration. -Will continue to monitor. -Advised patient to schedule nurse/lab visit for repeat CMP due to medication increase and BP recheck.      Relevant Medications   triamterene-hydrochlorothiazide (MAXZIDE) 75-50 MG tablet     Endocrine   Glucose intolerance (impaired glucose tolerance) : A1c elevated (Chronic)    -Recent A1c 5.8, mildly increased from prior. -Advised patient to reduce sweets and monitor carbohydrates. -Continue to stay as active as possible. -Will continue to monitor.        Other   Vitamin D deficiency (Chronic)    -Recent vitamin D mildly low at 28.0 -Recommend to start taking vitamin D3 2000 units daily. -Will continue to monitor.      Persistent dry cough - Primary    -Etiology is unclear.  Recent upper respiratory symptoms could possibly be contributing to worsening cough so will send Tessalon Perles. -Patient denies reflux symptoms but does have a diagnosis of GERD. -If symptoms fail to improve or worsened recommend further evaluation.      Relevant Medications   benzonatate (TESSALON) 100 MG capsule   Insomnia    -Stable -Continue current medication regimen. -Continue to limit caffeine and establish a good sleep hygiene.         Meds ordered this encounter  Medications   benzonatate (TESSALON) 100 MG capsule  Sig: Take 1 capsule (100 mg total) by mouth 3 (three) times daily as needed for cough.    Dispense:  30 capsule    Refill:  0    Order Specific Question:   Supervising Provider    Answer:   Hali Marry [[id number]]   triamterene-hydrochlorothiazide (MAXZIDE) 75-50 MG tablet    Sig: Take 1 tablet by mouth daily.    Dispense:  90 tablet    Refill:  0    Order  Specific Question:   Supervising Provider    Answer:   Beatrice Lecher D [[id number]]    Follow-up: Return in about 3 months (around 06/11/2020) for HTN, persistent cough; lab visit in 4 weeks for CMP/bp recheck.   Note:  This note was prepared with assistance of Dragon voice recognition software. Occasional wrong-word or sound-a-like substitutions may have occurred due to the inherent limitations of voice recognition software.  Lorrene Reid, PA-C

## 2020-03-11 NOTE — Assessment & Plan Note (Addendum)
-  Etiology is unclear.  Recent upper respiratory symptoms could possibly be contributing to worsening cough so will send Tessalon Perles. -Patient denies reflux symptoms but does have a diagnosis of GERD. -If symptoms fail to improve or worsened recommend further evaluation.

## 2020-03-11 NOTE — Patient Instructions (Signed)
DASH Eating Plan DASH stands for "Dietary Approaches to Stop Hypertension." The DASH eating plan is a healthy eating plan that has been shown to reduce high blood pressure (hypertension). It may also reduce your risk for type 2 diabetes, heart disease, and stroke. The DASH eating plan may also help with weight loss. What are tips for following this plan?  General guidelines  Avoid eating more than 2,300 mg (milligrams) of salt (sodium) a day. If you have hypertension, you may need to reduce your sodium intake to 1,500 mg a day.  Limit alcohol intake to no more than 1 drink a day for nonpregnant women and 2 drinks a day for men. One drink equals 12 oz of beer, 5 oz of wine, or 1 oz of hard liquor.  Work with your health care provider to maintain a healthy body weight or to lose weight. Ask what an ideal weight is for you.  Get at least 30 minutes of exercise that causes your heart to beat faster (aerobic exercise) most days of the week. Activities may include walking, swimming, or biking.  Work with your health care provider or diet and nutrition specialist (dietitian) to adjust your eating plan to your individual calorie needs. Reading food labels   Check food labels for the amount of sodium per serving. Choose foods with less than 5 percent of the Daily Value of sodium. Generally, foods with less than 300 mg of sodium per serving fit into this eating plan.  To find whole grains, look for the word "whole" as the first word in the ingredient list. Shopping  Buy products labeled as "low-sodium" or "no salt added."  Buy fresh foods. Avoid canned foods and premade or frozen meals. Cooking  Avoid adding salt when cooking. Use salt-free seasonings or herbs instead of table salt or sea salt. Check with your health care provider or pharmacist before using salt substitutes.  Do not fry foods. [place] foods using healthy methods such as baking, boiling, grilling, and broiling instead.  [place] with  heart-healthy oils, such as olive, canola, soybean, or sunflower oil. Meal planning  Eat a balanced diet that includes: ? 5 or more servings of fruits and vegetables each day. At each meal, try to fill half of your plate with fruits and vegetables. ? Up to 6-8 servings of whole grains each day. ? Less than 6 oz of lean meat, poultry, or fish each day. A 3-oz serving of meat is about the same size as a deck of cards. One egg equals 1 oz. ? 2 servings of low-fat dairy each day. ? A serving of nuts, seeds, or beans 5 times each week. ? Heart-healthy fats. Healthy fats called Omega-3 fatty acids are found in foods such as flaxseeds and coldwater fish, like sardines, salmon, and mackerel.  Limit how much you eat of the following: ? Canned or prepackaged foods. ? Food that is high in trans fat, such as fried foods. ? Food that is high in saturated fat, such as fatty meat. ? Sweets, desserts, sugary drinks, and other foods with added sugar. ? Full-fat dairy products.  Do not salt foods before eating.  Try to eat at least 2 vegetarian meals each week.  Eat more home-cooked food and less restaurant, buffet, and fast food.  When eating at a restaurant, ask that your food be prepared with less salt or no salt, if possible. What foods are recommended? The items listed may not be a complete list. Talk with your dietitian about   what dietary choices are best for you. Grains Whole-grain or whole-wheat bread. Whole-grain or whole-wheat pasta. Brown rice. Oatmeal. Quinoa. Bulgur. Whole-grain and low-sodium cereals. Pita bread. Low-fat, low-sodium crackers. Whole-wheat flour tortillas. Vegetables Fresh or frozen vegetables (raw, steamed, roasted, or grilled). Low-sodium or reduced-sodium tomato and vegetable juice. Low-sodium or reduced-sodium tomato sauce and tomato paste. Low-sodium or reduced-sodium canned vegetables. Fruits All fresh, dried, or frozen fruit. Canned fruit in natural juice (without  added sugar). Meat and other protein foods Skinless chicken or turkey. Ground chicken or turkey. Pork with fat trimmed off. Fish and seafood. Egg whites. Dried beans, peas, or lentils. Unsalted nuts, nut butters, and seeds. Unsalted canned beans. Lean cuts of beef with fat trimmed off. Low-sodium, lean deli meat. Dairy Low-fat (1%) or fat-free (skim) milk. Fat-free, low-fat, or reduced-fat cheeses. Nonfat, low-sodium ricotta or cottage cheese. Low-fat or nonfat yogurt. Low-fat, low-sodium cheese. Fats and oils Soft margarine without trans fats. Vegetable oil. Low-fat, reduced-fat, or light mayonnaise and salad dressings (reduced-sodium). Canola, safflower, olive, soybean, and sunflower oils. Avocado. Seasoning and other foods Herbs. Spices. Seasoning mixes without salt. Unsalted popcorn and pretzels. Fat-free sweets. What foods are not recommended? The items listed may not be a complete list. Talk with your dietitian about what dietary choices are best for you. Grains Baked goods made with fat, such as croissants, muffins, or some breads. Dry pasta or rice meal packs. Vegetables Creamed or fried vegetables. Vegetables in a cheese sauce. Regular canned vegetables (not low-sodium or reduced-sodium). Regular canned tomato sauce and paste (not low-sodium or reduced-sodium). Regular tomato and vegetable juice (not low-sodium or reduced-sodium). Pickles. Olives. Fruits Canned fruit in a light or heavy syrup. Fried fruit. Fruit in cream or butter sauce. Meat and other protein foods Fatty cuts of meat. Ribs. Fried meat. Bacon. Sausage. Bologna and other processed lunch meats. Salami. Fatback. Hotdogs. Bratwurst. Salted nuts and seeds. Canned beans with added salt. Canned or smoked fish. Whole eggs or egg yolks. Chicken or turkey with skin. Dairy Whole or 2% milk, cream, and half-and-half. Whole or full-fat cream cheese. Whole-fat or sweetened yogurt. Full-fat cheese. Nondairy creamers. Whipped toppings.  Processed cheese and cheese spreads. Fats and oils Butter. Stick margarine. Lard. Shortening. Ghee. Bacon fat. Tropical oils, such as coconut, palm kernel, or palm oil. Seasoning and other foods Salted popcorn and pretzels. Onion salt, garlic salt, seasoned salt, table salt, and sea salt. Worcestershire sauce. Tartar sauce. Barbecue sauce. Teriyaki sauce. Soy sauce, including reduced-sodium. Steak sauce. Canned and packaged gravies. Fish sauce. Oyster sauce. Cocktail sauce. Horseradish that you find on the shelf. Ketchup. Mustard. Meat flavorings and tenderizers. Bouillon cubes. Hot sauce and Tabasco sauce. Premade or packaged marinades. Premade or packaged taco seasonings. Relishes. Regular salad dressings. Where to find more information:  National Heart, Lung, and Blood Institute: www.nhlbi.nih.gov  American Heart Association: www.heart.org Summary  The DASH eating plan is a healthy eating plan that has been shown to reduce high blood pressure (hypertension). It may also reduce your risk for type 2 diabetes, heart disease, and stroke.  With the DASH eating plan, you should limit salt (sodium) intake to 2,300 mg a day. If you have hypertension, you may need to reduce your sodium intake to 1,500 mg a day.  When on the DASH eating plan, aim to eat more fresh fruits and vegetables, whole grains, lean proteins, low-fat dairy, and heart-healthy fats.  Work with your health care provider or diet and nutrition specialist (dietitian) to adjust your eating plan to your   individual calorie needs. This information is not intended to replace advice given to you by your health care provider. Make sure you discuss any questions you have with your health care provider. Document Revised: 04/13/2017 Document Reviewed: 04/24/2016 Elsevier Patient Education  2020 Elsevier Inc.  

## 2020-04-09 ENCOUNTER — Other Ambulatory Visit: Payer: Medicare Other

## 2020-04-12 ENCOUNTER — Other Ambulatory Visit: Payer: Self-pay | Admitting: Physician Assistant

## 2020-04-12 DIAGNOSIS — Z0000 Encounter for general adult medical examination without abnormal findings: Secondary | ICD-10-CM

## 2020-04-13 ENCOUNTER — Other Ambulatory Visit: Payer: Self-pay

## 2020-04-13 ENCOUNTER — Other Ambulatory Visit (INDEPENDENT_AMBULATORY_CARE_PROVIDER_SITE_OTHER): Payer: Medicare Other

## 2020-04-13 ENCOUNTER — Telehealth: Payer: Self-pay | Admitting: Physician Assistant

## 2020-04-13 VITALS — BP 161/73 | HR 80

## 2020-04-13 DIAGNOSIS — Z0000 Encounter for general adult medical examination without abnormal findings: Secondary | ICD-10-CM | POA: Diagnosis not present

## 2020-04-13 DIAGNOSIS — Z23 Encounter for immunization: Secondary | ICD-10-CM

## 2020-04-13 DIAGNOSIS — I10 Essential (primary) hypertension: Secondary | ICD-10-CM

## 2020-04-13 MED ORDER — CARVEDILOL 12.5 MG PO TABS: 12.5000 mg | ORAL_TABLET | Freq: Two times a day (BID) | ORAL | 0 refills | Status: DC

## 2020-04-13 NOTE — Telephone Encounter (Signed)
Patient states she is feeling better from nausea, headache after influenza vaccine.

## 2020-04-13 NOTE — Telephone Encounter (Signed)
Patient BP has been elevated during several visits. Her BP was checked at a [facility] today and was still high.   I called patient to verify what medication she was taking for her BP- she states she is taking Carvedilol but not taking the Maxzide. Patient states she "feels great, do I need to take the medicine" I advised patient with BP elevated at the current readings she could potentially have a stroke of cardiovascular episode. Patient verbalized understanding.   I called the pharmacy to verify what meds patient is taking. Pharmacy states patient only picked up 180 tabs of Carvedilol in April and never got a refill. Patient never picked up Maxzide.  Per [name] I have instructed patient to take Carvedilol and Maxzide daily and to check her BP in the AM and in PM for 2 weeks and call with BP reading. Patient verbalized understanding and was agreeable.  Spoke to pharmacy about discussing with patient again when she goes to pick up meds to take both meds daily. Sent new RX for Carvedilol to pharmacy and they are going to fill Maxzide script from October.   [name] is aware of the above and is agreeable to plan of action.   Going to call tomorrow to follow up and insure patient is taking BP meds correctly. AS, CMA

## 2020-04-13 NOTE — Addendum Note (Signed)
Addended by: Mickel Crow on: 04/13/2020 08:52 AM   Modules accepted: Orders

## 2020-04-13 NOTE — Progress Notes (Signed)
Patient tolerated vaccine well. VIS given. AS, CMA 

## 2020-04-14 LAB — COMPREHENSIVE METABOLIC PANEL
ALT: 38 IU/L — ABNORMAL HIGH (ref 0–32)
AST: 40 IU/L (ref 0–40)
Albumin/Globulin Ratio: 1.4 (ref 1.2–2.2)
Albumin: 4.3 g/dL (ref 3.7–4.7)
Alkaline Phosphatase: 97 IU/L (ref 44–121)
BUN/Creatinine Ratio: 12 (ref 12–28)
BUN: 8 mg/dL (ref 8–27)
Bilirubin Total: 0.7 mg/dL (ref 0.0–1.2)
CO2: 22 mmol/L (ref 20–29)
Calcium: 9.2 mg/dL (ref 8.7–10.3)
Chloride: 106 mmol/L (ref 96–106)
Creatinine, Ser: 0.69 mg/dL (ref 0.57–1.00)
GFR calc Af Amer: 96 mL/min/{1.73_m2} (ref >59)
GFR calc non Af Amer: 83 mL/min/{1.73_m2} (ref >59)
Globulin, Total: 3 g/dL (ref 1.5–4.5)
Glucose: 125 mg/dL — ABNORMAL HIGH (ref 65–99)
Potassium: 4 mmol/L (ref 3.5–5.2)
Sodium: 142 mmol/L (ref 134–144)
Total Protein: 7.3 g/dL (ref 6.0–8.5)

## 2020-06-11 ENCOUNTER — Ambulatory Visit: Payer: Medicare Other | Admitting: Physician Assistant

## 2020-10-19 ENCOUNTER — Other Ambulatory Visit: Payer: Self-pay

## 2020-10-19 ENCOUNTER — Encounter: Payer: Self-pay | Admitting: Physician Assistant

## 2020-10-19 ENCOUNTER — Ambulatory Visit (INDEPENDENT_AMBULATORY_CARE_PROVIDER_SITE_OTHER): Payer: Medicare Other | Admitting: Physician Assistant

## 2020-10-19 VITALS — BP 143/66 | HR 65 | Temp 98.3°F | Ht 66.0 in | Wt 237.7 lb

## 2020-10-19 DIAGNOSIS — I8393 Asymptomatic varicose veins of bilateral lower extremities: Secondary | ICD-10-CM

## 2020-10-19 DIAGNOSIS — Z8673 Personal history of transient ischemic attack (TIA), and cerebral infarction without residual deficits: Secondary | ICD-10-CM

## 2020-10-19 DIAGNOSIS — R42 Dizziness and giddiness: Secondary | ICD-10-CM

## 2020-10-19 DIAGNOSIS — Z823 Family history of stroke: Secondary | ICD-10-CM | POA: Diagnosis not present

## 2020-10-19 DIAGNOSIS — I10 Essential (primary) hypertension: Secondary | ICD-10-CM

## 2020-10-19 DIAGNOSIS — G4700 Insomnia, unspecified: Secondary | ICD-10-CM

## 2020-10-19 NOTE — Patient Instructions (Signed)
Managing Your Hypertension Hypertension, also called high blood pressure, is when the force of the blood pressing against the walls of the arteries is too strong. Arteries are blood vessels that carry blood from your heart throughout your body. Hypertension forces the heart to work harder to pump blood and may cause the arteries to become narrow or stiff. Understanding blood pressure readings Your personal target blood pressure may vary depending on your medical conditions, your age, and other factors. A blood pressure reading includes a higher number over a lower number. Ideally, your blood pressure should be below 120/80. You should know that:  The first, or top, number is called the systolic pressure. It is a measure of the pressure in your arteries as your heart beats.  The second, or bottom number, is called the diastolic pressure. It is a measure of the pressure in your arteries as the heart relaxes. Blood pressure is classified into four stages. Based on your blood pressure reading, your health care provider may use the following stages to determine what type of treatment you need, if any. Systolic pressure and diastolic pressure are measured in a unit called mmHg. Normal  Systolic pressure: below 120.  Diastolic pressure: below 80. Elevated  Systolic pressure: 120-129.  Diastolic pressure: below 80. Hypertension stage 1  Systolic pressure: 130-139.  Diastolic pressure: 80-89. Hypertension stage 2  Systolic pressure: 140 or above.  Diastolic pressure: 90 or above. How can this condition affect me? Managing your hypertension is an important responsibility. Over time, hypertension can damage the arteries and decrease blood flow to important parts of the body, including the brain, heart, and kidneys. Having untreated or uncontrolled hypertension can lead to:  A heart attack.  A stroke.  A weakened blood vessel (aneurysm).  Heart failure.  Kidney damage.  Eye  damage.  Metabolic syndrome.  Memory and concentration problems.  Vascular dementia. What actions can I take to manage this condition? Hypertension can be managed by making lifestyle changes and possibly by taking medicines. Your health care provider will help you make a plan to bring your blood pressure within a normal range. Nutrition  Eat a diet that is high in fiber and potassium, and low in salt (sodium), added sugar, and fat. An example eating plan is called the Dietary Approaches to Stop Hypertension (DASH) diet. To eat this way: ? Eat plenty of fresh fruits and vegetables. Try to fill one-half of your plate at each meal with fruits and vegetables. ? Eat whole grains, such as whole-wheat pasta, brown rice, or whole-grain bread. Fill about one-fourth of your plate with whole grains. ? Eat low-fat dairy products. ? Avoid fatty cuts of meat, processed or cured meats, and poultry with skin. Fill about one-fourth of your plate with lean proteins such as fish, chicken without skin, beans, eggs, and tofu. ? Avoid pre-made and processed foods. These tend to be higher in sodium, added sugar, and fat.  Reduce your daily sodium intake. Most people with hypertension should eat less than 1,500 mg of sodium a day.   Lifestyle  Work with your health care provider to maintain a healthy body weight or to lose weight. Ask what an ideal weight is for you.  Get at least 30 minutes of exercise that causes your heart to beat faster (aerobic exercise) most days of the week. Activities may include walking, swimming, or biking.  Include exercise to strengthen your muscles (resistance exercise), such as weight lifting, as part of your weekly exercise routine. Try   to do these types of exercises for 30 minutes at least 3 days a week.  Do not use any products that contain nicotine or tobacco, such as cigarettes, e-cigarettes, and chewing tobacco. If you need help quitting, ask your health care  provider.  Control any long-term (chronic) conditions you have, such as high cholesterol or diabetes.  Identify your sources of stress and find ways to manage stress. This may include meditation, deep breathing, or making time for fun activities.   Alcohol use  Do not drink alcohol if: ? Your health care provider tells you not to drink. ? You are pregnant, may be pregnant, or are planning to become pregnant.  If you drink alcohol: ? Limit how much you use to:  0-1 drink a day for women.  0-2 drinks a day for men. ? Be aware of how much alcohol is in your drink. In the [place] one drink equals one 12 oz bottle of beer (355 mL), one 5 oz glass of wine (148 mL), or one 1 oz glass of hard liquor (44 mL). Medicines Your health care provider may prescribe medicine if lifestyle changes are not enough to get your blood pressure under control and if:  Your systolic blood pressure is 130 or higher.  Your diastolic blood pressure is 80 or higher. Take medicines only as told by your health care provider. Follow the directions carefully. Blood pressure medicines must be taken as told by your health care provider. The medicine does not work as well when you skip doses. Skipping doses also puts you at risk for problems. Monitoring Before you monitor your blood pressure:  Do not smoke, drink caffeinated beverages, or exercise within 30 minutes before taking a measurement.  Use the bathroom and empty your bladder (urinate).  Sit quietly for at least 5 minutes before taking measurements. Monitor your blood pressure at home as told by your health care provider. To do this:  Sit with your back straight and supported.  Place your feet flat on the floor. Do not cross your legs.  Support your arm on a flat surface, such as a table. Make sure your upper arm is at heart level.  Each time you measure, take two or three readings one minute apart and record the results. You may also need to have your  blood pressure checked regularly by your health care provider.   General information  Talk with your health care provider about your diet, exercise habits, and other lifestyle factors that may be contributing to hypertension.  Review all the medicines you take with your health care provider because there may be side effects or interactions.  Keep all visits as told by your health care provider. Your health care provider can help you create and adjust your plan for managing your high blood pressure. Where to find more information  National Heart, Lung, and Blood Institute: www.nhlbi.nih.gov  American Heart Association: www.heart.org Contact a health care provider if:  You think you are having a reaction to medicines you have taken.  You have repeated (recurrent) headaches.  You feel dizzy.  You have swelling in your ankles.  You have trouble with your vision. Get help right away if:  You develop a severe headache or confusion.  You have unusual weakness or numbness, or you feel faint.  You have severe pain in your chest or abdomen.  You vomit repeatedly.  You have trouble breathing. These symptoms may represent a serious problem that is an emergency. Do not wait   to see if the symptoms will go away. Get medical help right away. Call your local emergency services (911 in the [place] Do not drive yourself to the hospital. Summary  Hypertension is when the force of blood pumping through your arteries is too strong. If this condition is not controlled, it may put you at risk for serious complications.  Your personal target blood pressure may vary depending on your medical conditions, your age, and other factors. For most people, a normal blood pressure is less than 120/80.  Hypertension is managed by lifestyle changes, medicines, or both.  Lifestyle changes to help manage hypertension include losing weight, eating a healthy, low-sodium diet, exercising more, stopping smoking, and  limiting alcohol. This information is not intended to replace advice given to you by your health care provider. Make sure you discuss any questions you have with your health care provider. Document Revised: 06/06/2019 Document Reviewed: 04/01/2019 Elsevier Patient Education  2021 Elsevier Inc.  

## 2020-10-19 NOTE — Progress Notes (Addendum)
Established Patient Office Visit  Subjective:  Patient ID: Kimberly Ferrell, female    DOB: 27-May-1940  Age: 80 y.o. MRN: [id number]  CC:  Chief Complaint  Patient presents with  . Follow-up  . Hypertension    HPI Kimberly Ferrell presents for follow up on hypertension. Patient is accompanied by her husband. Patient reports takes her blood pressure medication- carvedilol only when she feels dizzy. Does not take medication daily. Denies chest pain, palpitations, or shortness of breath. Patient's husband reports episodes where patient seems to be in a gaze which lasts for 1-2 minutes and usually has to tap on her face to bring her back. Patient is awake but unresponsive. Denies facial dropping or slurred speech. States episodes occur randomly and have been happening for the past 30 years. Denies prior work-up. Patient states she feels when episodes are going to occur because she gets dizzy and nauseous. Reports family history of stroke. Patient has c/o of worsening varicose veins of left lower leg. Does not wear compression socks. Patient monitors sodium intake and states she knows she needs to improve water intake.  Insomnia: Patient reports has chronic insomnia, has trouble falling asleep. Tried Trazodone in the past which did help some. Has tried melatonin in the past with minimal efficacy. States only has one cup of caffeine (coffee) per day in the mornings.   Past Medical History:  Diagnosis Date  . Arthritis   . Cancer ([place])    skin cancer removed from right forehead  . Chicken pox as child  . Colon polyps   . Complication of anesthesia    "hard to wake up"  . GERD (gastroesophageal reflux disease)    occasional  . Hemorrhoids    "sometimes"  . Hypertension   . Lung nodule seen on imaging study    noted 8/13- needs CT f/u in 1 year  . Mononucleosis   . Pneumonia    hx of  . PONV (postoperative nausea and vomiting)   . Rosacea   . TIA (transient ischemic attack) 2007    from IV dye, no residual, passed out and was told by RN that it was TIA    Past Surgical History:  Procedure Laterality Date  . ABDOMINAL HYSTERECTOMY  1993  . BREAST BIOPSY Left 2004   [place]   . CHOLECYSTECTOMY  1992  . HEMORRHOID SURGERY  1960's  . PARTIAL KNEE ARTHROPLASTY Right 08/09/2016   Procedure: RIGHT UNICOMPARTMENTAL KNEE;  Surgeon: Gaynelle Arabian, MD;  Location: [facility];  Service: Orthopedics;  Laterality: Right;  requests 1hr; Adductor Block  . TOTAL KNEE ARTHROPLASTY Left 05/23/2013   Procedure: LEFT TOTAL KNEE ARTHROPLASTY;  Surgeon: Gearlean Alf, MD;  Location: [facility];  Service: Orthopedics;  Laterality: Left;    Family History  Problem Relation Age of Onset  . Cancer Mother 64       colon  . Sudden death Father 58       suicide  . Stroke Maternal Grandmother 78  . Hypertension Neg Hx   . Hyperlipidemia Neg Hx   . Heart attack Neg Hx   . Diabetes Neg Hx   . Breast cancer Neg Hx     Social History   Socioeconomic History  . Marital status: Married    Spouse name: Not on file  . Number of children: Not on file  . Years of education: Not on file  . Highest education level: Not on file  Occupational History  . Not on  file  Tobacco Use  . Smoking status: Never Smoker  . Smokeless tobacco: Never Used  Substance and Sexual Activity  . Alcohol use: No  . Drug use: No  . Sexual activity: Not Currently  Other Topics Concern  . Not on file  Social History Narrative  . Not on file   Social Determinants of Health   Financial Resource Strain: Not on file  Food Insecurity: Not on file  Transportation Needs: Not on file  Physical Activity: Not on file  Stress: Not on file  Social Connections: Not on file  Intimate Partner Violence: Not on file    Outpatient Medications Prior to Visit  Medication Sig Dispense Refill  . carvedilol (COREG) 12.5 MG tablet Take 1 tablet (12.5 mg total) by mouth 2 (two) times daily with a meal. 180 tablet 0  . aspirin EC 81  MG tablet Take 81 mg by mouth daily. (Patient not taking: Reported on 10/19/2020)    . Melatonin 3 MG CAPS 1-3 tabs of the sustained release melatonin OTC per night before bed (Patient not taking: Reported on 10/19/2020)  0  . traZODone (DESYREL) 100 MG tablet Take 1 tablet (100 mg total) by mouth at bedtime as needed for sleep. (Patient not taking: Reported on 10/19/2020) 90 tablet 1  . triamterene-hydrochlorothiazide (MAXZIDE) 75-50 MG tablet Take 1 tablet by mouth daily. (Patient not taking: Reported on 10/19/2020) 90 tablet 0  . benzonatate (TESSALON) 100 MG capsule Take 1 capsule (100 mg total) by mouth 3 (three) times daily as needed for cough. (Patient not taking: Reported on 10/19/2020) 30 capsule 0  . Cholecalciferol (VITAMIN D3) 1.25 MG (50000 UT) TABS Take 1.25 mg by mouth daily. (Patient not taking: Reported on 10/19/2020)    . Vitamin D, Ergocalciferol, (DRISDOL) 1.25 MG (50000 UNIT) CAPS capsule One tablet twice weekly (Patient not taking: Reported on 10/19/2020) 24 capsule 1   No facility-administered medications prior to visit.    Allergies  Allergen Reactions  . Other Other (See Comments)    IV dye, heart stopped  . Amlodipine Nausea And Vomiting    Dizziness  . Valsartan Rash    ROS Review of Systems A fourteen system review of systems was performed and found to be positive as per HPI.    Objective:    Physical Exam General:  Well Developed, well nourished, in no acute distress  Neuro:  Alert and oriented,  extra-ocular muscles intact, CN II-XII grossly intact   HEENT:  Normocephalic, atraumatic, neck supple Skin:  no gross rash, warm, pink. Cardiac:  RRR, S1 S2 Respiratory:  ECTA B/L, Not using accessory muscles, speaking in full sentences- unlabored. Vascular:  Varicosities of both lower extremities noted, no calf tenderness, good DP pulse b/l  Psych:  No HI/SI, judgement and insight good, Euthymic mood. Full Affect.  BP (!) 143/66   Pulse 65   Temp 98.3 F (36.8 C)    Ht 5\' 6"  (1.676 m)   Wt 237 lb 11.2 oz (107.8 kg)   SpO2 97%   BMI 38.37 kg/m  Wt Readings from Last 3 Encounters:  10/19/20 237 lb 11.2 oz (107.8 kg)  03/11/20 234 lb 11.2 oz (106.5 kg)  12/10/19 (!) 247 lb (112 kg)     Health Maintenance Due  Topic Date Due  . Pneumococcal Vaccine 57-40 Years old (1 of 4 - PCV13) Never done  . Hepatitis C Screening  Never done    There are no preventive care reminders to display for this  patient.  Lab Results  Component Value Date   TSH 1.020 06/05/2018   Lab Results  Component Value Date   WBC 5.5 02/20/2020   HGB 14.1 02/20/2020   HCT 41.4 02/20/2020   MCV 93 02/20/2020   PLT 166 02/20/2020   Lab Results  Component Value Date   NA 142 04/13/2020   K 4.0 04/13/2020   CO2 22 04/13/2020   GLUCOSE 125 (H) 04/13/2020   BUN 8 04/13/2020   CREATININE 0.69 04/13/2020   BILITOT 0.7 04/13/2020   ALKPHOS 97 04/13/2020   AST 40 04/13/2020   ALT 38 (H) 04/13/2020   PROT 7.3 04/13/2020   ALBUMIN 4.3 04/13/2020   CALCIUM 9.2 04/13/2020   ANIONGAP 9 08/10/2016   GFR 81.93 04/16/2013   Lab Results  Component Value Date   CHOL 131 02/20/2020   Lab Results  Component Value Date   HDL 42 02/20/2020   Lab Results  Component Value Date   LDLCALC 74 02/20/2020   Lab Results  Component Value Date   TRIG 78 02/20/2020   Lab Results  Component Value Date   CHOLHDL 3.1 02/20/2020   Lab Results  Component Value Date   HGBA1C 5.8 (H) 02/20/2020      Assessment & Plan:   Problem List Items Addressed This Visit   None   Visit Diagnoses    Uncontrolled hypertension    -  Primary   Relevant Orders   CT Head Wo Contrast   Dizziness       Relevant Orders   CT Head Wo Contrast   Family hx-stroke       Relevant Orders   CT Head Wo Contrast   Varicose veins of both lower extremities, unspecified whether complicated       History of TIA (transient ischemic attack)       Relevant Orders   CT Head Wo Contrast      Uncontrolled hypertension: -BP uncontrolled likely secondary to medication non-compliance. Discussed with patient importance of medication adherence to improve BP and ensure stability. Patient verbalized understanding and states will start taking medication daily.  -Advised to schedule lab visit for fasting labs.  -Recommend to start checking BP/pulse daily and keep a log to bring to next OV. BP goal <140/90. -Follow up in 3 months.   History of TIA, Dizziness: -Patient has a history of previous TIA and risk factors for CVA such as uncontrolled HTN and coronary atherosclerosis so will place order for head CT for further evaluation. Upon chart review, patient has been evaluated by Neurology in the past (2017) for syncope, no imaging studies performed due to low suspicion for TIA or seizure. Recommend to resume aspirin.  -No nystagmus noted during exam so less likely vertigo related. -Discussed with patient referral to cardiology for further evaluation and recommend to pursue pending imaging study.  Varicose veins of both lower extremities: -Recommend to use compression socks. Offered vascular referral, patient declined at this time and prefers to monitor symptoms.   Insomnia: -Recommend to avoid daytime naps and caffeine. Continue Trazodone as needed if effective in the past. -Will continue to monitor.    No orders of the defined types were placed in this encounter.   Follow-up: Return in about 3 months (around 01/19/2021) for HTN with FBW 1 week.   Note:  This note was prepared with assistance of Dragon voice recognition software. Occasional wrong-word or sound-a-like substitutions may have occurred due to the inherent limitations of voice recognition software.  Lorrene Reid, PA-C

## 2020-11-12 ENCOUNTER — Telehealth: Payer: Self-pay | Admitting: Physician Assistant

## 2020-11-12 NOTE — Telephone Encounter (Signed)
Spoke with patient and advised CT was ordered per [facility] due to dizziness. Pt was agreeable to CT. Called [facility] and they are going to reach back out to patient. AS< CMA

## 2020-11-12 NOTE — Telephone Encounter (Signed)
Patient states she received a call concerning a ct scan and she was not aware of it. Please advise, thanks.

## 2021-01-05 ENCOUNTER — Other Ambulatory Visit: Payer: Self-pay | Admitting: Physician Assistant

## 2021-01-05 DIAGNOSIS — Z0000 Encounter for general adult medical examination without abnormal findings: Secondary | ICD-10-CM

## 2021-01-05 DIAGNOSIS — E559 Vitamin D deficiency, unspecified: Secondary | ICD-10-CM

## 2021-01-05 DIAGNOSIS — I10 Essential (primary) hypertension: Secondary | ICD-10-CM

## 2021-01-05 DIAGNOSIS — Z8673 Personal history of transient ischemic attack (TIA), and cerebral infarction without residual deficits: Secondary | ICD-10-CM

## 2021-01-05 DIAGNOSIS — Z87898 Personal history of other specified conditions: Secondary | ICD-10-CM

## 2021-01-12 ENCOUNTER — Other Ambulatory Visit: Payer: Self-pay

## 2021-01-12 ENCOUNTER — Other Ambulatory Visit: Payer: Medicare Other

## 2021-01-12 DIAGNOSIS — Z0000 Encounter for general adult medical examination without abnormal findings: Secondary | ICD-10-CM

## 2021-01-12 DIAGNOSIS — I10 Essential (primary) hypertension: Secondary | ICD-10-CM

## 2021-01-12 DIAGNOSIS — Z8673 Personal history of transient ischemic attack (TIA), and cerebral infarction without residual deficits: Secondary | ICD-10-CM

## 2021-01-12 DIAGNOSIS — Z87898 Personal history of other specified conditions: Secondary | ICD-10-CM

## 2021-01-12 DIAGNOSIS — E559 Vitamin D deficiency, unspecified: Secondary | ICD-10-CM

## 2021-01-13 LAB — CBC
Hematocrit: 42 % (ref 34.0–46.6)
Hemoglobin: 14.4 g/dL (ref 11.1–15.9)
MCH: 31 pg (ref 26.6–33.0)
MCHC: 34.3 g/dL (ref 31.5–35.7)
MCV: 90 fL (ref 79–97)
Platelets: 165 10*3/uL (ref 150–450)
RBC: 4.65 x10E6/uL (ref 3.77–5.28)
RDW: 12.7 % (ref 11.7–15.4)
WBC: 6.3 10*3/uL (ref 3.4–10.8)

## 2021-01-13 LAB — LIPID PANEL
Chol/HDL Ratio: 3.1 ratio (ref 0.0–4.4)
Cholesterol, Total: 140 mg/dL (ref 100–199)
HDL: 45 mg/dL (ref >39)
LDL Chol Calc (NIH): 76 mg/dL (ref 0–99)
Triglycerides: 102 mg/dL (ref 0–149)
VLDL Cholesterol Cal: 19 mg/dL (ref 5–40)

## 2021-01-13 LAB — COMPREHENSIVE METABOLIC PANEL
ALT: 30 IU/L (ref 0–32)
AST: 37 IU/L (ref 0–40)
Albumin/Globulin Ratio: 1.5 (ref 1.2–2.2)
Albumin: 4.1 g/dL (ref 3.7–4.7)
Alkaline Phosphatase: 88 IU/L (ref 44–121)
BUN/Creatinine Ratio: 12 (ref 12–28)
BUN: 10 mg/dL (ref 8–27)
Bilirubin Total: 0.7 mg/dL (ref 0.0–1.2)
CO2: 23 mmol/L (ref 20–29)
Calcium: 9.3 mg/dL (ref 8.7–10.3)
Chloride: 106 mmol/L (ref 96–106)
Creatinine, Ser: 0.82 mg/dL (ref 0.57–1.00)
Globulin, Total: 2.8 g/dL (ref 1.5–4.5)
Glucose: 107 mg/dL — ABNORMAL HIGH (ref 65–99)
Potassium: 4.1 mmol/L (ref 3.5–5.2)
Sodium: 143 mmol/L (ref 134–144)
Total Protein: 6.9 g/dL (ref 6.0–8.5)
eGFR: 72 mL/min/{1.73_m2} (ref >59)

## 2021-01-13 LAB — TSH: TSH: 1.49 u[IU]/mL (ref 0.450–4.500)

## 2021-01-13 LAB — HEMOGLOBIN A1C
Est. average glucose Bld gHb Est-mCnc: 120 mg/dL
Hgb A1c MFr Bld: 5.8 % — ABNORMAL HIGH (ref 4.8–5.6)

## 2021-01-13 LAB — VITAMIN D 25 HYDROXY (VIT D DEFICIENCY, FRACTURES): Vit D, 25-Hydroxy: 28.2 ng/mL — ABNORMAL LOW (ref 30.0–100.0)

## 2021-01-14 NOTE — Progress Notes (Signed)
Established Patient Office Visit  Subjective:  Patient ID: Kimberly Ferrell, female    DOB: 1940/07/28  Age: 80 y.o. MRN: [id number]  CC: Experiencing for about a week dizzy spells and nausea. Son is concerned with her short term memory over the past month noticed higher amounts of episodes.   Hypertension, follow-up  HPI: Kimberly Ferrell. [name] presents to follow up hypertension. Patient is accompanied by her son and husband. Son and husband state the patient is having coughing spells and experiencing SOB when walking to kitchen, patient is wheezing to catch her breath which has been ongoing problem several months. Denies prior hx of asthma or COPD. Patient continues to have intermittent dizziness and headaches. Denies chest pain, palpitations or lower extremity swelling. Patient's husband reports episodes of syncope which last couple minutes. Patient's son has concerns of memory changes. Patient reports sometimes she forgets what day or date it is. Does state forgetting people's name which she knows well. No family hx of dementia.   BP Readings from Last 3 Encounters:  01/19/21 (!) 169/79  10/19/20 (!) 143/66  04/13/20 (!) 161/73   Wt Readings from Last 3 Encounters:  01/19/21 227 lb (103 kg)  10/19/20 237 lb 11.2 oz (107.8 kg)  03/11/20 234 lb 11.2 oz (106.5 kg)     She was last seen for hypertension 3 months ago.  BP at that visit was 175/94. Management since that visit includes improving medication adherence.  She reports poor compliance with treatment, does not take blood pressure medication daily.  She is not having side effects.  She is following a Regular diet. She is not exercising. She does not smoke.    Does not check BP at home.   Symptoms: No chest pain No chest pressure  No palpitations Yes syncope  Yes dyspnea No orthopnea  No paroxysmal nocturnal dyspnea No lower extremity edema    Pertinent labs: Lab Results  Component Value Date   CHOL 140 01/12/2021   HDL  45 01/12/2021   LDLCALC 76 01/12/2021   TRIG 102 01/12/2021   CHOLHDL 3.1 01/12/2021   Lab Results  Component Value Date   NA 143 01/12/2021   K 4.1 01/12/2021   CREATININE 0.82 01/12/2021   GFRNONAA 83 04/13/2020   GFRAA 96 04/13/2020   GLUCOSE 107 (H) 01/12/2021     The ASCVD Risk score (Goff DC Jr., et al., 2013) failed to calculate for the following reasons:   The 2013 ASCVD risk score is only valid for ages 49 to 24   ---------------------------------------------------------------------------------------------------     Past Medical History:  Diagnosis Date   Arthritis    Cancer ([place])    skin cancer removed from right forehead   Chicken pox as child   Colon polyps    Complication of anesthesia    "hard to wake up"   GERD (gastroesophageal reflux disease)    occasional   Hemorrhoids    "sometimes"   Hypertension    Lung nodule seen on imaging study    noted 8/13- needs CT f/u in 1 year   Mononucleosis    Pneumonia    hx of   PONV (postoperative nausea and vomiting)    Rosacea    TIA (transient ischemic attack) 2007   from IV dye, no residual, passed out and was told by RN that it was TIA    Past Surgical History:  Procedure Laterality Date   ABDOMINAL HYSTERECTOMY  1993   BREAST BIOPSY Left 2004  [place]    CHOLECYSTECTOMY  1992   HEMORRHOID SURGERY  1960's   PARTIAL KNEE ARTHROPLASTY Right 08/09/2016   Procedure: RIGHT UNICOMPARTMENTAL KNEE;  Surgeon: Gaynelle Arabian, MD;  Location: [facility];  Service: Orthopedics;  Laterality: Right;  requests 1hr; Adductor Block   TOTAL KNEE ARTHROPLASTY Left 05/23/2013   Procedure: LEFT TOTAL KNEE ARTHROPLASTY;  Surgeon: Gearlean Alf, MD;  Location: [facility];  Service: Orthopedics;  Laterality: Left;    Family History  Problem Relation Age of Onset   Cancer Mother 59       colon   Sudden death Father 24       suicide   Stroke Maternal Grandmother 78   Hypertension Neg Hx    Hyperlipidemia Neg Hx    Heart attack  Neg Hx    Diabetes Neg Hx    Breast cancer Neg Hx     Social History   Socioeconomic History   Marital status: Married    Spouse name: Not on file   Number of children: Not on file   Years of education: Not on file   Highest education level: Not on file  Occupational History   Not on file  Tobacco Use   Smoking status: Never   Smokeless tobacco: Never  Substance and Sexual Activity   Alcohol use: No   Drug use: No   Sexual activity: Not Currently  Other Topics Concern   Not on file  Social History Narrative   Not on file   Social Determinants of Health   Financial Resource Strain: Not on file  Food Insecurity: Not on file  Transportation Needs: Not on file  Physical Activity: Not on file  Stress: Not on file  Social Connections: Not on file  Intimate Partner Violence: Not on file    Outpatient Medications Prior to Visit  Medication Sig Dispense Refill   carvedilol (COREG) 12.5 MG tablet Take 1 tablet (12.5 mg total) by mouth 2 (two) times daily with a meal. 180 tablet 0   aspirin EC 81 MG tablet Take 81 mg by mouth daily. (Patient not taking: Reported on 10/19/2020)     Melatonin 3 MG CAPS 1-3 tabs of the sustained release melatonin OTC per night before bed (Patient not taking: No sig reported)  0   traZODone (DESYREL) 100 MG tablet Take 1 tablet (100 mg total) by mouth at bedtime as needed for sleep. (Patient not taking: Reported on 10/19/2020) 90 tablet 1   triamterene-hydrochlorothiazide (MAXZIDE) 75-50 MG tablet Take 1 tablet by mouth daily. (Patient not taking: Reported on 10/19/2020) 90 tablet 0   No facility-administered medications prior to visit.    Allergies  Allergen Reactions   Other Other (See Comments)    IV dye, heart stopped   Amlodipine Nausea And Vomiting    Dizziness   Valsartan Rash    ROS Review of Systems Review of Systems:  A fourteen system review of systems was performed and found to be positive as per HPI.  Objective:    Physical  Exam General:  Well Developed, well nourished, appropriate for stated age.  Neuro:  Alert and oriented,  extra-ocular muscles intact  HEENT:  Normocephalic, atraumatic, neck supple Skin:  no gross rash, warm, pink. Cardiac:  RRR, S1 S2 Respiratory:  CTA B/L with slight rhonchi at lung bases, no wheezing, crackles or rales, Not using accessory muscles, speaking in full sentences- unlabored. Vascular:  Ext warm, no cyanosis apprec.; cap RF less 2 sec. Trace edema RLE>LLE  Psych:  No HI/SI, judgement and insight good, tearful- mood. Full Affect.  BP (!) 169/79   Pulse 75   Temp 97.7 F (36.5 C)   Ht $R'5\' 6"'fS$  (1.676 m)   Wt 227 lb (103 kg)   BMI 36.64 kg/m  Wt Readings from Last 3 Encounters:  01/19/21 227 lb (103 kg)  10/19/20 237 lb 11.2 oz (107.8 kg)  03/11/20 234 lb 11.2 oz (106.5 kg)     Health Maintenance Due  Topic Date Due   COVID-19 Vaccine (4 - Booster for Pfizer series) 07/28/2020    There are no preventive care reminders to display for this patient.  Lab Results  Component Value Date   TSH 1.490 01/12/2021   Lab Results  Component Value Date   WBC 6.3 01/12/2021   HGB 14.4 01/12/2021   HCT 42.0 01/12/2021   MCV 90 01/12/2021   PLT 165 01/12/2021   Lab Results  Component Value Date   NA 143 01/12/2021   K 4.1 01/12/2021   CO2 23 01/12/2021   GLUCOSE 107 (H) 01/12/2021   BUN 10 01/12/2021   CREATININE 0.82 01/12/2021   BILITOT 0.7 01/12/2021   ALKPHOS 88 01/12/2021   AST 37 01/12/2021   ALT 30 01/12/2021   PROT 6.9 01/12/2021   ALBUMIN 4.1 01/12/2021   CALCIUM 9.3 01/12/2021   ANIONGAP 9 08/10/2016   EGFR 72 01/12/2021   GFR 81.93 04/16/2013   Lab Results  Component Value Date   CHOL 140 01/12/2021   Lab Results  Component Value Date   HDL 45 01/12/2021   Lab Results  Component Value Date   LDLCALC 76 01/12/2021   Lab Results  Component Value Date   TRIG 102 01/12/2021   Lab Results  Component Value Date   CHOLHDL 3.1 01/12/2021    Lab Results  Component Value Date   HGBA1C 5.8 (H) 01/12/2021      Assessment & Plan:   Problem List Items Addressed This Visit       Other   Syncope (Chronic)   Relevant Orders   Ambulatory referral to Neurology   CT HEAD WO CONTRAST (5MM)   Other Visit Diagnoses     Uncontrolled hypertension    -  Primary   Relevant Orders   CT HEAD WO CONTRAST (5MM)   Needs flu shot       Relevant Orders   Flu Vaccine QUAD High Dose(Fluad) (Completed)   Memory changes       Relevant Orders   Ambulatory referral to Neurology   CT HEAD WO CONTRAST (5MM)   Dizziness       Relevant Orders   Ambulatory referral to Neurology   CT HEAD WO CONTRAST (5MM)   Shortness of breath       Relevant Orders   DG Chest 2 View   Prediabetes       Vitamin D insufficiency          Uncontrolled hypertension: -BP elevated in office, patient noncompliant with medication. Discussed potential risks of uncontrolled hypertension and is agreeable to start taking medication daily as instructed. Patient's husband will assist patient with improving medication compliance. Discussed the use of pill box to help with remembering. Discussed with patient headache and dizziness likely related to uncontrolled hypertension. -Continue low sodium diet. -Will reassess BP and medication therapy in 4 weeks.  Syncope, memory changes, dizziness: -Order for head CT was placed 10/19/2020 and patient has not obtained imaging study so will place new order  for further evaluation. Discussed abnormal 6CIT screening and recommend referral to Neurology for further evaluation. Patient and family are agreeable. During visit patient asked the same question (BP goal) more than once shortly after it was discussed. Also recommend neurology consultation for episodic syncope episodes and dizziness. Discussed with patient recommend risk stratification for prevention of CVD especially with hx of TIA and uncontrolled HTN. Recommend obtaining  echocardiogram and will further discuss with patient at follow up visit, patient currently overwhelmed. Recent lipid panel normal, LDL 76, goal <70.  Shortness of breath: -Discussed with patient various etiologies. Will place order for CXR for further evaluation. Pending CXR, recommend obtaining echocardiogram. Recent labs including TSH, CBC and CMP (electrolytes, renal function) are normal. -Will reassess symptoms at follow up visit.  Prediabetes: -Recent A1c stable at 5.8, recommend to follow a low carbohydrate and glucose diet.  -Will continue to monitor.  Vitamin D insufficiency: -Recent Vitamin D 28.2, recommend to start taking Vitamin D3 2000 units daily.  No orders of the defined types were placed in this encounter.   Follow-up: Return in about 4 weeks (around 02/16/2021) for HTN, dizziness.    Lorrene Reid, PA-C

## 2021-01-14 NOTE — Patient Instructions (Addendum)
BP goal is less than 140/90  Start over-the-counter Vitamin D3 2000 units daily.  [facility] [place], [place]  Hypertension, Adult Hypertension is another name for high blood pressure. High blood pressure forces your heart to work harder to pump blood. This can cause problems over time. There are two numbers in a blood pressure reading. There is a top number (systolic) over a bottom number (diastolic). It is best to have a blood pressure that is below 120/80. Healthy choices can help lower your blood pressure, or you may need medicine to help lower it. What are the causes? The cause of this condition is not known. Some conditions may be related to high blood pressure. What increases the risk? Smoking. Having type 2 diabetes mellitus, high cholesterol, or both. Not getting enough exercise or physical activity. Being overweight. Having too much fat, sugar, calories, or salt (sodium) in your diet. Drinking too much alcohol. Having long-term (chronic) kidney disease. Having a family history of high blood pressure. Age. Risk increases with age. Race. You may be at higher risk if you are African American. Gender. Men are at higher risk than women before age 65. After age 25, women are at higher risk than men. Having obstructive sleep apnea. Stress. What are the signs or symptoms? High blood pressure may not cause symptoms. Very high blood pressure (hypertensive crisis) may cause: Headache. Feelings of worry or nervousness (anxiety). Shortness of breath. Nosebleed. A feeling of being sick to your stomach (nausea). Throwing up (vomiting). Changes in how you see. Very bad chest pain. Seizures. How is this treated? This condition is treated by making healthy lifestyle changes, such as: Eating healthy foods. Exercising more. Drinking less alcohol. Your health care provider may prescribe medicine if lifestyle changes are not enough to get your blood  pressure under control, and if: Your top number is above 130. Your bottom number is above 80. Your personal target blood pressure may vary. Follow these instructions at home: Eating and drinking  If told, follow the DASH eating plan. To follow this plan: Fill one half of your plate at each meal with fruits and vegetables. Fill one fourth of your plate at each meal with whole grains. Whole grains include whole-wheat pasta, brown rice, and whole-grain bread. Eat or drink low-fat dairy products, such as skim milk or low-fat yogurt. Fill one fourth of your plate at each meal with low-fat (lean) proteins. Low-fat proteins include fish, chicken without skin, eggs, beans, and tofu. Avoid fatty meat, cured and processed meat, or chicken with skin. Avoid pre-made or processed food. Eat less than 1,500 mg of salt each day. Do not drink alcohol if: Your doctor tells you not to drink. You are pregnant, may be pregnant, or are planning to become pregnant. If you drink alcohol: Limit how much you use to: 0-1 drink a day for women. 0-2 drinks a day for men. Be aware of how much alcohol is in your drink. In the [place] one drink equals one 12 oz bottle of beer (355 mL), one 5 oz glass of wine (148 mL), or one 1 oz glass of hard liquor (44 mL). Lifestyle  Work with your doctor to stay at a healthy weight or to lose weight. Ask your doctor what the best weight is for you. Get at least 30 minutes of exercise most days of the week. This may include walking, swimming, or biking. Get at least 30 minutes of exercise that strengthens your muscles (resistance exercise) at  least 3 days a week. This may include lifting weights or doing Pilates. Do not use any products that contain nicotine or tobacco, such as cigarettes, e-cigarettes, and chewing tobacco. If you need help quitting, ask your doctor. Check your blood pressure at home as told by your doctor. Keep all follow-up visits as told by your doctor. This is  important. Medicines Take over-the-counter and prescription medicines only as told by your doctor. Follow directions carefully. Do not skip doses of blood pressure medicine. The medicine does not work as well if you skip doses. Skipping doses also puts you at risk for problems. Ask your doctor about side effects or reactions to medicines that you should watch for. Contact a doctor if you: Think you are having a reaction to the medicine you are taking. Have headaches that keep coming back (recurring). Feel dizzy. Have swelling in your ankles. Have trouble with your vision. Get help right away if you: Get a very bad headache. Start to feel mixed up (confused). Feel weak or numb. Feel faint. Have very bad pain in your: Chest. Belly (abdomen). Throw up more than once. Have trouble breathing. Summary Hypertension is another name for high blood pressure. High blood pressure forces your heart to work harder to pump blood. For most people, a normal blood pressure is less than 120/80. Making healthy choices can help lower blood pressure. If your blood pressure does not get lower with healthy choices, you may need to take medicine. This information is not intended to replace advice given to you by your health care provider. Make sure you discuss any questions you have with your health care provider. Document Revised: 01/09/2018 Document Reviewed: 01/09/2018 Elsevier Patient Education  [place].

## 2021-01-19 ENCOUNTER — Ambulatory Visit (INDEPENDENT_AMBULATORY_CARE_PROVIDER_SITE_OTHER): Payer: Medicare Other | Admitting: Physician Assistant

## 2021-01-19 ENCOUNTER — Encounter: Payer: Self-pay | Admitting: Physician Assistant

## 2021-01-19 ENCOUNTER — Other Ambulatory Visit: Payer: Self-pay

## 2021-01-19 VITALS — BP 169/79 | HR 75 | Temp 97.7°F | Ht 66.0 in | Wt 227.0 lb

## 2021-01-19 DIAGNOSIS — R413 Other amnesia: Secondary | ICD-10-CM

## 2021-01-19 DIAGNOSIS — E559 Vitamin D deficiency, unspecified: Secondary | ICD-10-CM

## 2021-01-19 DIAGNOSIS — R0602 Shortness of breath: Secondary | ICD-10-CM

## 2021-01-19 DIAGNOSIS — R55 Syncope and collapse: Secondary | ICD-10-CM

## 2021-01-19 DIAGNOSIS — I10 Essential (primary) hypertension: Secondary | ICD-10-CM

## 2021-01-19 DIAGNOSIS — R42 Dizziness and giddiness: Secondary | ICD-10-CM

## 2021-01-19 DIAGNOSIS — Z23 Encounter for immunization: Secondary | ICD-10-CM

## 2021-01-19 DIAGNOSIS — R7303 Prediabetes: Secondary | ICD-10-CM

## 2021-02-05 ENCOUNTER — Ambulatory Visit (INDEPENDENT_AMBULATORY_CARE_PROVIDER_SITE_OTHER): Payer: Medicare Other

## 2021-02-05 ENCOUNTER — Other Ambulatory Visit: Payer: Self-pay

## 2021-02-05 DIAGNOSIS — Z0000 Encounter for general adult medical examination without abnormal findings: Secondary | ICD-10-CM | POA: Diagnosis not present

## 2021-02-05 NOTE — Patient Instructions (Signed)
Health Maintenance, Female Adopting a healthy lifestyle and getting preventive care are important in promoting health and wellness. Ask your health care provider about: The right schedule for you to have regular tests and exams. Things you can do on your own to prevent diseases and keep yourself healthy. What should I know about diet, weight, and exercise? Eat a healthy diet  Eat a diet that includes plenty of vegetables, fruits, low-fat dairy products, and lean protein. Do not eat a lot of foods that are high in solid fats, added sugars, or sodium. Maintain a healthy weight Body mass index (BMI) is used to identify weight problems. It estimates body fat based on height and weight. Your health care provider can help determine your BMI and help you achieve or maintain a healthy weight. Get regular exercise Get regular exercise. This is one of the most important things you can do for your health. Most adults should: Exercise for at least 150 minutes each week. The exercise should increase your heart rate and make you sweat (moderate-intensity exercise). Do strengthening exercises at least twice a week. This is in addition to the moderate-intensity exercise. Spend less time sitting. Even light physical activity can be beneficial. Watch cholesterol and blood lipids Have your blood tested for lipids and cholesterol at 80 years of age, then have this test every 5 years. Have your cholesterol levels checked more often if: Your lipid or cholesterol levels are high. You are older than 80 years of age. You are at high risk for heart disease. What should I know about cancer screening? Depending on your health history and family history, you may need to have cancer screening at various ages. This may include screening for: Breast cancer. Cervical cancer. Colorectal cancer. Skin cancer. Lung cancer. What should I know about heart disease, diabetes, and high blood pressure? Blood pressure and heart  disease High blood pressure causes heart disease and increases the risk of stroke. This is more likely to develop in people who have high blood pressure readings, are of African descent, or are overweight. Have your blood pressure checked: Every 3-5 years if you are 18-39 years of age. Every year if you are 40 years old or older. Diabetes Have regular diabetes screenings. This checks your fasting blood sugar level. Have the screening done: Once every three years after age 40 if you are at a normal weight and have a low risk for diabetes. More often and at a younger age if you are overweight or have a high risk for diabetes. What should I know about preventing infection? Hepatitis B If you have a higher risk for hepatitis B, you should be screened for this virus. Talk with your health care provider to find out if you are at risk for hepatitis B infection. Hepatitis C Testing is recommended for: Everyone born from 1945 through 1965. Anyone with known risk factors for hepatitis C. Sexually transmitted infections (STIs) Get screened for STIs, including gonorrhea and chlamydia, if: You are sexually active and are younger than 80 years of age. You are older than 80 years of age and your health care provider tells you that you are at risk for this type of infection. Your sexual activity has changed since you were last screened, and you are at increased risk for chlamydia or gonorrhea. Ask your health care provider if you are at risk. Ask your health care provider about whether you are at high risk for HIV. Your health care provider may recommend a prescription medicine   to help prevent HIV infection. If you choose to take medicine to prevent HIV, you should first get tested for HIV. You should then be tested every 3 months for as long as you are taking the medicine. Pregnancy If you are about to stop having your period (premenopausal) and you may become pregnant, seek counseling before you get  pregnant. Take 400 to 800 micrograms (mcg) of folic acid every day if you become pregnant. Ask for birth control (contraception) if you want to prevent pregnancy. Osteoporosis and menopause Osteoporosis is a disease in which the bones lose minerals and strength with aging. This can result in bone fractures. If you are 65 years old or older, or if you are at risk for osteoporosis and fractures, ask your health care provider if you should: Be screened for bone loss. Take a calcium or vitamin D supplement to lower your risk of fractures. Be given hormone replacement therapy (HRT) to treat symptoms of menopause. Follow these instructions at home: Lifestyle Do not use any products that contain nicotine or tobacco, such as cigarettes, e-cigarettes, and chewing tobacco. If you need help quitting, ask your health care provider. Do not use street drugs. Do not share needles. Ask your health care provider for help if you need support or information about quitting drugs. Alcohol use Do not drink alcohol if: Your health care provider tells you not to drink. You are pregnant, may be pregnant, or are planning to become pregnant. If you drink alcohol: Limit how much you use to 0-1 drink a day. Limit intake if you are breastfeeding. Be aware of how much alcohol is in your drink. In the [place] one drink equals one 12 oz bottle of beer (355 mL), one 5 oz glass of wine (148 mL), or one 1 oz glass of hard liquor (44 mL). General instructions Schedule regular health, dental, and eye exams. Stay current with your vaccines. Tell your health care provider if: You often feel depressed. You have ever been abused or do not feel safe at home. Summary Adopting a healthy lifestyle and getting preventive care are important in promoting health and wellness. Follow your health care provider's instructions about healthy diet, exercising, and getting tested or screened for diseases. Follow your health care provider's  instructions on monitoring your cholesterol and blood pressure. This information is not intended to replace advice given to you by your health care provider. Make sure you discuss any questions you have with your health care provider. Document Revised: 07/09/2020 Document Reviewed: 04/24/2018 Elsevier Patient Education  2022 [employer]  

## 2021-02-05 NOTE — Progress Notes (Signed)
Subjective:   Kimberly Ferrell is a 80 y.o. female who presents for an Initial Medicare Annual Wellness Visit. I connected with  Kimberly Ferrell on 02/05/21 by a audio enabled telemedicine application and verified that I am speaking with the correct person using two identifiers. Location of [place]:  home Location of Provider: office Persons participating in vist: Monik Lins   I discussed the limitations of evaluation and management by telemedicine. The patient expressed understanding and agreed to proceed.   Review of Systems    Defer to pcp Cardiac Risk Factors include: advanced age (>30men, >67 women);Other (see comment);hypertension, Risk factor comments: pre diabetes     Objective:    There were no vitals filed for this visit. There is no height or weight on file to calculate BMI.  Advanced Directives 02/05/2021 11/02/2016 08/09/2016 08/01/2016 01/31/2016 05/23/2013 05/14/2013  Does Patient Have a Medical Advance Directive? Yes Yes Yes Yes Yes Patient has advance directive, copy in chart Patient has advance directive, copy in chart  Type of Advance Directive Living will;Healthcare Power of [place];Out of facility DNR (pink MOST or yellow form) [place];Living will [place];Living will [place];Living will Living will;Healthcare Power of [place];Living will [place];Living will  Does patient want to make changes to medical advance directive? - No - Patient declined No - Patient declined - - No -  Copy of Healthcare Power of Attorney in Chart? No - copy requested No - copy requested Yes - - Copy requested from family Copy requested from family  Pre-existing out of facility DNR order (yellow form or pink MOST form) - - - - - No No    Current Medications (verified) Outpatient Encounter Medications as of 02/05/2021  Medication Sig   carvedilol (COREG) 12.5 MG tablet Take 1  tablet (12.5 mg total) by mouth 2 (two) times daily with a meal.   No facility-administered encounter medications on file as of 02/05/2021.    Allergies (verified) Other, Amlodipine, and Valsartan   History: Past Medical History:  Diagnosis Date   Arthritis    Cancer ([place])    skin cancer removed from right forehead   Chicken pox as child   Colon polyps    Complication of anesthesia    "hard to wake up"   GERD (gastroesophageal reflux disease)    occasional   Hemorrhoids    "sometimes"   Hypertension    Lung nodule seen on imaging study    noted 8/13- needs CT f/u in 1 year   Mononucleosis    Pneumonia    hx of   PONV (postoperative nausea and vomiting)    Rosacea    TIA (transient ischemic attack) 2007   from IV dye, no residual, passed out and was told by RN that it was TIA   Past Surgical History:  Procedure Laterality Date   ABDOMINAL HYSTERECTOMY  1993   BREAST BIOPSY Left 2004   [place] Core    [place]  1960's   PARTIAL KNEE ARTHROPLASTY Right 08/09/2016   Procedure: RIGHT UNICOMPARTMENTAL KNEE;  Surgeon: Gaynelle Arabian, MD;  Location: [facility];  Service: Orthopedics;  Laterality: Right;  requests 1hr; Adductor Block   TOTAL KNEE ARTHROPLASTY Left 05/23/2013   Procedure: LEFT TOTAL KNEE ARTHROPLASTY;  Surgeon: Gearlean Alf, MD;  Location: [facility];  Service: Orthopedics;  Laterality: Left;   Family History  Problem Relation Age of  Onset   Cancer Mother 53       colon   Sudden death Father 72       suicide   Stroke Maternal Grandmother 78   Hypertension Neg Hx    Hyperlipidemia Neg Hx    Heart attack Neg Hx    Diabetes Neg Hx    Breast cancer Neg Hx    Social History   Socioeconomic History   Marital status: Married    Spouse name: Not on file   Number of children: Not on file   Years of education: Not on file   Highest education level: Not on file  Occupational History   Not on file  Tobacco Use   Smoking status: Never    Smokeless tobacco: Never  Substance and Sexual Activity   Alcohol use: No   Drug use: No   Sexual activity: Not Currently  Other Topics Concern   Not on file  Social History Narrative   Not on file   Social Determinants of Health   Financial Resource Strain: Not on file  Food Insecurity: Not on file  Transportation Needs: Not on file  Physical Activity: Not on file  Stress: Not on file  Social Connections: Not on file    Tobacco Counseling Counseling given: Not Answered   Clinical Intake:  Pre-visit preparation completed: Yes  Pain : No/denies pain     Nutritional Status: BMI > 30  Obese Diabetes: No  How often do you need to have someone help you when you read instructions, pamphlets, or other written materials from your doctor or pharmacy?: 1 - Never What is the last grade level you completed in school?: 12 th  Diabetic: Hx of prediabetes  Interpreter Needed?: No  Information entered by :: [place] of Daily Living In your present state of health, do you have any difficulty performing the following activities: 02/05/2021 01/19/2021  Hearing? N N  Vision? N N  Difficulty concentrating or making decisions? N Y  Walking or climbing stairs? N N  Dressing or bathing? N N  Doing errands, shopping? N N  Preparing Food and eating ? N -  Using the Toilet? N -  In the past six months, have you accidently leaked urine? N -  Do you have problems with loss of bowel control? N -  Managing your Medications? N -  Managing your Finances? N -  Housekeeping or managing your Housekeeping? N -  Some recent data might be hidden    Patient Care Team: Lorrene Reid, PA-C as PCP - Gaston Islam, MD as Consulting Physician (Orthopedic Surgery) Druscilla Brownie, MD as Consulting Physician (Dermatology) Phineas Real, Belinda Block, MD (Inactive) as Consulting Physician (Gynecology) Pieter Partridge, DO as Consulting Physician (Neurology)  Indicate any  recent Medical Services you may have received from other than [facility] providers in the past year (date may be approximate).     Assessment:   This is a routine wellness examination for [place].  Hearing/Vision screen No results found.  Dietary issues and exercise activities discussed: Current Exercise Habits: The patient does not participate in regular exercise at present, Exercise limited by: None identified   Goals Addressed   None    Depression Screen PHQ 2/9 Scores 02/05/2021 01/19/2021 10/19/2020 03/11/2020 12/10/2019 09/09/2019 06/11/2019  PHQ - 2 Score 0 0 0 0 0 0 0  PHQ- 9 Score 0 3 0 0 1 0 0    Fall Risk Fall Risk  02/05/2021 01/19/2021 10/19/2020  03/11/2020 12/10/2019  Falls in the past year? 0 0 0 0 0  Number falls in past yr: 0 0 0 - -  Injury with Fall? 0 0 0 - -  Risk Factor Category  - - - - -  Comment - - - - -  Risk for fall due to : No Fall Risks No Fall Risks No Fall Risks - No Fall Risks  Follow up Falls evaluation completed Falls evaluation completed Falls evaluation completed Falls evaluation completed Falls evaluation completed    [place]:  Any stairs in or around the home? Yes  If so, are there any without handrails? Yes  Home free of loose throw rugs in walkways, pet beds, electrical cords, etc? Yes  Adequate lighting in your home to reduce risk of falls? Yes   ASSISTIVE DEVICES UTILIZED TO PREVENT FALLS:  Life alert? No  Use of a cane, walker or w/c? No  Grab bars in the bathroom? Yes  Shower chair or bench in shower? No  Elevated toilet seat or a handicapped toilet? Yes   TIMED UP AND GO:  Was the test performed? No .  Length of time to ambulate N/A    Cognitive Function:     6CIT Screen 02/05/2021 01/19/2021  What Year? 0 points 4 points  What month? 0 points 0 points  What time? 0 points 0 points  Count back from 20 0 points 4 points  Months in reverse 0 points 0 points  Repeat phrase 10 points 0 points   Total Score 10 8    Immunizations Immunization History  Administered Date(s) Administered   Fluad Quad(high Dose 65+) 01/19/2021   Influenza, High Dose Seasonal PF 01/13/2013, 03/22/2016, 03/06/2017, 04/13/2020   Influenza,inj,quad, With Preservative 02/12/2017, 02/04/2019   Influenza-Unspecified 02/12/2014, 01/29/2018, 02/04/2019   PFIZER(Purple Top)SARS-COV-2 Vaccination 07/07/2019, 07/28/2019, 04/29/2020   Pneumococcal Conjugate-13 12/01/2015   Pneumococcal Polysaccharide-23 02/12/2006   Tdap 05/26/2015   Zoster Recombinat (Shingrix) 01/29/2018, 04/09/2018   Zoster, Live 06/15/2010    TDAP status: Up to date  Flu Vaccine status: Up to date  Pneumococcal vaccine status: Up to date  Covid-19 vaccine status: Information provided on how to obtain vaccines.   Qualifies for Shingles Vaccine? No   Zostavax completed Yes  06/15/10 Shingrix Completed?: Yes  Screening Tests Health Maintenance  Topic Date Due   COVID-19 Vaccine (4 - Booster for Pfizer series) 07/22/2020   TETANUS/TDAP  05/25/2025   INFLUENZA VACCINE  Completed   DEXA SCAN  Completed   Zoster Vaccines- Shingrix  Completed   HPV VACCINES  Aged Out    Health Maintenance  Health Maintenance Due  Topic Date Due   COVID-19 Vaccine (4 - Booster for [place] series) 07/22/2020    Colorectal cancer screening: No longer required.   Mammogram status: No longer required due to age.  Bone Density status: Completed 02/15/18. Results reflect: Bone density results: OSTEOPENIA. Repeat every two years.  Lung Cancer Screening: (Low Dose CT Chest recommended if Age 15-80 years, 30 pack-year currently smoking OR have quit w/in 15years.) does not qualify.   Lung Cancer Screening Referral: Refer to pcp  Additional Screening:  Hepatitis C Screening: does qualify; Completed never labs  Vision Screening: Recommended annual ophthalmology exams for early detection of glaucoma and other disorders of the eye. Is the patient up  to date with their annual eye exam?  Yes  Who is the provider or what is the name of the office in which  the patient attends annual eye exams? Dr. Katy Fitch  If pt is not established with a provider, would they like to be referred to a provider to establish care? No .   Dental Screening: Recommended annual dental exams for proper oral hygiene  Community Resource Referral / Chronic Care Management: CRR required this visit?  No   CCM required this visit?  No      Plan:     I have personally reviewed and noted the following in the patient's chart:   Medical and social history Use of alcohol, tobacco or illicit drugs  Current medications and supplements including opioid prescriptions. Patient is not currently taking opioid prescriptions. Functional ability and status Nutritional status Physical activity Advanced directives List of other physicians Hospitalizations, surgeries, and ER visits in previous 12 months Vitals Screenings to include cognitive, depression, and falls Referrals and appointments  In addition, I have reviewed and discussed with patient certain preventive protocols, quality metrics, and best practice recommendations. A written personalized care plan for preventive services as well as general preventive health recommendations were provided to patient.     Elyse Jarvis, [place]   02/05/2021   Nurse Notes: Non Face to Face  Ms. Sweeting , Thank you for taking time to come for your Medicare Wellness Visit. I appreciate your ongoing commitment to your health goals. Please review the following plan we discussed and let me know if I can assist you in the future.   These are the goals we discussed:  Goals   None     This is a list of the screening recommended for you and due dates:  Health Maintenance  Topic Date Due   COVID-19 Vaccine (4 - Booster for Pfizer series) 07/22/2020   Tetanus Vaccine  05/25/2025   Flu Shot  Completed   DEXA scan (bone density measurement)   Completed   Zoster (Shingles) Vaccine  Completed   HPV Vaccine  Aged Out

## 2021-02-16 ENCOUNTER — Other Ambulatory Visit: Payer: Medicare Other

## 2021-02-17 ENCOUNTER — Ambulatory Visit: Payer: Medicare Other | Admitting: Physician Assistant

## 2021-02-22 ENCOUNTER — Encounter: Payer: Self-pay | Admitting: Physician Assistant

## 2021-02-22 ENCOUNTER — Ambulatory Visit (INDEPENDENT_AMBULATORY_CARE_PROVIDER_SITE_OTHER): Payer: Medicare Other | Admitting: Physician Assistant

## 2021-02-22 ENCOUNTER — Other Ambulatory Visit: Payer: Self-pay

## 2021-02-22 VITALS — BP 167/82 | HR 80 | Temp 98.0°F | Ht 66.0 in | Wt 236.1 lb

## 2021-02-22 DIAGNOSIS — I10 Essential (primary) hypertension: Secondary | ICD-10-CM

## 2021-02-22 DIAGNOSIS — R42 Dizziness and giddiness: Secondary | ICD-10-CM | POA: Diagnosis not present

## 2021-02-22 DIAGNOSIS — M545 Low back pain, unspecified: Secondary | ICD-10-CM | POA: Diagnosis not present

## 2021-02-22 NOTE — Patient Instructions (Addendum)
[facility] [address], [place]  [phone]  Dizziness Dizziness is a common problem. It is a feeling of unsteadiness or light-headedness. You may feel like you are about to faint. Dizziness can lead to injury if you stumble or fall. Anyone can become dizzy, but dizziness is more common in older adults. This condition can be caused by a number of things, including medicines, dehydration, or illness. Follow these instructions at home: Eating and drinking  Drink enough fluid to keep your urine pale yellow. This helps to keep you from becoming dehydrated. Try to drink more clear fluids, such as water. Do not drink alcohol. Limit your caffeine intake if told to do so by your health care provider. Check ingredients and nutrition facts to see if a food or beverage contains caffeine. Limit your salt (sodium) intake if told to do so by your health care provider. Check ingredients and nutrition facts to see if a food or beverage contains sodium. Activity  Avoid making quick movements. Rise slowly from chairs and steady yourself until you feel okay. In the morning, first sit up on the side of the bed. When you feel okay, stand slowly while you hold onto something until you know that your balance is good. If you need to stand in one place for a long time, move your legs often. Tighten and relax the muscles in your legs while you are standing. Do not drive or use machinery if you feel dizzy. Avoid bending down if you feel dizzy. Place items in your home so that they are easy for you to reach without leaning over. Lifestyle Do not use any products that contain nicotine or tobacco. These products include cigarettes, chewing tobacco, and vaping devices, such as e-cigarettes. If you need help quitting, ask your health care provider. Try to reduce your stress level by using methods such as yoga or meditation. Talk with your health care provider if you need help to manage your  stress. General instructions Watch your dizziness for any changes. Take over-the-counter and prescription medicines only as told by your health care provider. Talk with your health care provider if you think that your dizziness is caused by a medicine that you are taking. Tell a friend or a family member that you are feeling dizzy. If he or she notices any changes in your behavior, have this person call your health care provider. Keep all follow-up visits. This is important. Contact a health care provider if: Your dizziness does not go away or you have new symptoms. Your dizziness or light-headedness gets worse. You feel nauseous. You have reduced hearing. You have a fever. You have neck pain or a stiff neck. Your dizziness leads to an injury or a fall. Get help right away if: You vomit or have diarrhea and are unable to eat or drink anything. You have problems talking, walking, swallowing, or using your arms, hands, or legs. You feel generally weak. You have any bleeding. You are not thinking clearly or you have trouble forming sentences. It may take a friend or family member to notice this. You have chest pain, abdominal pain, shortness of breath, or sweating. Your vision changes or you develop a severe headache. These symptoms may represent a serious problem that is an emergency. Do not wait to see if the symptoms will go away. Get medical help right away. Call your local emergency services (911 in the [place] Do not drive yourself to the hospital. Summary Dizziness is a feeling of unsteadiness  or light-headedness. This condition can be caused by a number of things, including medicines, dehydration, or illness. Anyone can become dizzy, but dizziness is more common in older adults. Drink enough fluid to keep your urine pale yellow. Do not drink alcohol. Avoid making quick movements if you feel dizzy. Monitor your dizziness for any changes. This information is not intended to replace  advice given to you by your health care provider. Make sure you discuss any questions you have with your health care provider. Document Revised: 04/05/2020 Document Reviewed: 04/05/2020 Elsevier Patient Education  2022 [employer].

## 2021-02-22 NOTE — Progress Notes (Signed)
Established Patient Office Visit  Subjective:  Patient ID: Kimberly Ferrell, female    DOB: 07/22/1940  Age: 80 y.o. MRN: [id number]  CC:  Chief Complaint  Patient presents with   Hypertension    HPI Kimberly Ferrell presents for follow up on hypertension. Patient is accompanied by her son. Patient reports taking blood pressure once daily instead of twice daily. Patient reports her blood pressure at home has been 160s/80-90s. Checks her blood pressure a few minutes after taking her medication. Denies chest pain, recent shortness of breath, palpitations or edema. Does report feeling off balance and dizzy when getting up in the mornings the past few days. Symptoms last couple of minutes and improve as she rests. Patient states has been cleaning her closet and lifting boxes more than usual and has left forearm soreness and low back soreness. Denies jaw pain, n/v/, altered mental status, dysuria, urinary frequency, bladder or bowel dysfunction.   Past Medical History:  Diagnosis Date   Arthritis    Cancer ([facility])    skin cancer removed from right forehead   Chicken pox as child   Colon polyps    Complication of anesthesia    "hard to wake up"   GERD (gastroesophageal reflux disease)    occasional   Hemorrhoids    "sometimes"   Hypertension    Lung nodule seen on imaging study    noted 8/13- needs CT f/u in 1 year   Mononucleosis    Pneumonia    hx of   PONV (postoperative nausea and vomiting)    Rosacea    TIA (transient ischemic attack) 2007   from IV dye, no residual, passed out and was told by RN that it was TIA    Past Surgical History:  Procedure Laterality Date   ABDOMINAL HYSTERECTOMY  1993   BREAST BIOPSY Left 2004   [place] Core    CHOLECYSTECTOMY  1992   HEMORRHOID SURGERY  1960's   PARTIAL KNEE ARTHROPLASTY Right 08/09/2016   Procedure: RIGHT UNICOMPARTMENTAL KNEE;  Surgeon: Ollen Gross, MD;  Location: [facility];  Service: Orthopedics;  Laterality: Right;  requests  1hr; Adductor Block   TOTAL KNEE ARTHROPLASTY Left 05/23/2013   Procedure: LEFT TOTAL KNEE ARTHROPLASTY;  Surgeon: Loanne Drilling, MD;  Location: [facility];  Service: Orthopedics;  Laterality: Left;    Family History  Problem Relation Age of Onset   Cancer Mother 43       colon   Sudden death Father 42       suicide   Stroke Maternal Grandmother 62   Hypertension Neg Hx    Hyperlipidemia Neg Hx    Heart attack Neg Hx    Diabetes Neg Hx    Breast cancer Neg Hx     Social History   Socioeconomic History   Marital status: Married    Spouse name: Not on file   Number of children: Not on file   Years of education: Not on file   Highest education level: Not on file  Occupational History   Not on file  Tobacco Use   Smoking status: Never   Smokeless tobacco: Never  Substance and Sexual Activity   Alcohol use: No   Drug use: No   Sexual activity: Not Currently  Other Topics Concern   Not on file  Social History Narrative   Not on file   Social Determinants of Health   Financial Resource Strain: Not on file  Food Insecurity: Not on file  Transportation Needs: Not on file  Physical Activity: Not on file  Stress: Not on file  Social Connections: Not on file  Intimate Partner Violence: Not on file    Outpatient Medications Prior to Visit  Medication Sig Dispense Refill   carvedilol (COREG) 12.5 MG tablet Take 1 tablet (12.5 mg total) by mouth 2 (two) times daily with a meal. 180 tablet 0   No facility-administered medications prior to visit.    Allergies  Allergen Reactions   Other Other (See Comments)    IV dye, heart stopped   Amlodipine Nausea And Vomiting    Dizziness   Valsartan Rash    ROS Review of Systems A fourteen system review of systems was performed and found to be positive as per HPI.  Objective:    Physical Exam General:  Well Developed, well nourished, appropriate for stated age.  Neuro:  Alert and oriented,  extra-ocular muscles intact, no  focal deficits, mild nystagmus noted with horizontal gaze HEENT:  Normocephalic, atraumatic, neck supple Skin:  no gross rash, warm, pink. Cardiac:  RRR, S1 S2 Respiratory: CTA B/L, Not using accessory muscles, speaking in full sentences- unlabored. MSK: good ROM of UE and LE, tenderness of lumbar paraspinal muscles, no tenderness of lumbar spine or SI joint. No tenderness of left elbow or arm. Negative empty can test. Vascular:  Ext warm, no cyanosis appreciated. Varicosities noted. No edema. Psych:  No HI/SI, judgement and insight good, Euthymic mood. Full Affect.  BP (!) 167/82   Pulse 80   Temp 98 F (36.7 C)   Ht $R'5\' 6"'MG$  (1.676 m)   Wt 236 lb 1.9 oz (107.1 kg)   SpO2 95%   BMI 38.11 kg/m  Wt Readings from Last 3 Encounters:  02/22/21 236 lb 1.9 oz (107.1 kg)  01/19/21 227 lb (103 kg)  10/19/20 237 lb 11.2 oz (107.8 kg)     Health Maintenance Due  Topic Date Due   COVID-19 Vaccine (4 - Booster for Pfizer series) 07/22/2020    There are no preventive care reminders to display for this patient.  Lab Results  Component Value Date   TSH 1.490 01/12/2021   Lab Results  Component Value Date   WBC 6.3 01/12/2021   HGB 14.4 01/12/2021   HCT 42.0 01/12/2021   MCV 90 01/12/2021   PLT 165 01/12/2021   Lab Results  Component Value Date   NA 143 01/12/2021   K 4.1 01/12/2021   CO2 23 01/12/2021   GLUCOSE 107 (H) 01/12/2021   BUN 10 01/12/2021   CREATININE 0.82 01/12/2021   BILITOT 0.7 01/12/2021   ALKPHOS 88 01/12/2021   AST 37 01/12/2021   ALT 30 01/12/2021   PROT 6.9 01/12/2021   ALBUMIN 4.1 01/12/2021   CALCIUM 9.3 01/12/2021   ANIONGAP 9 08/10/2016   EGFR 72 01/12/2021   GFR 81.93 04/16/2013   Lab Results  Component Value Date   CHOL 140 01/12/2021   Lab Results  Component Value Date   HDL 45 01/12/2021   Lab Results  Component Value Date   LDLCALC 76 01/12/2021   Lab Results  Component Value Date   TRIG 102 01/12/2021   Lab Results  Component  Value Date   CHOLHDL 3.1 01/12/2021   Lab Results  Component Value Date   HGBA1C 5.8 (H) 01/12/2021      Assessment & Plan:   Problem List Items Addressed This Visit       Other   Back pain  Relevant Orders   DG Lumbar Spine Complete   Other Visit Diagnoses     Uncontrolled hypertension    -  Primary   Dizziness          Uncontrolled hypertension: -Uncontrolled. Discussed with patient taking medication as directed to improve blood pressure and titrate medication as needed. Patient reports will start taking medication twice daily. Discussed checking BP/pulse at least 2 hours after taking blood pressure medication. Encourage weight loss with diet changes. Goal is <140/90.  -Will reassess blood pressure and medication therapy in 4 weeks.  Dizziness: -Discussed with patient dizziness likely multifactorial including vertigo and uncontrolled hypertension. Discussed slow position changes and adequate hydration. -Patient denies cancelling head CT (per [facility] patient cancelled due to copay cost). -Patient's son will reschedule imaging appointment. -Patient refused neurology consult and denies of such in office. Patient and son are agreeable to proceed with referral, our office contacted [facility] and patient was scheduled for an appointment.  Low back pain: -No red flag s/s present concerning for cauda equina. Likely mild muscle strain related to overuse. Will place order for lumbar x-ray to evaluate for bony abnormality. Offered muscle relaxer, patient declined. Recommend to apply heat therapy and gentle stretches/exercises.  No orders of the defined types were placed in this encounter.   Follow-up: Return in about 4 weeks (around 03/22/2021) for HTN.    Lorrene Reid, PA-C

## 2021-03-07 ENCOUNTER — Other Ambulatory Visit: Payer: Self-pay | Admitting: Physician Assistant

## 2021-03-07 DIAGNOSIS — Z1231 Encounter for screening mammogram for malignant neoplasm of breast: Secondary | ICD-10-CM

## 2021-03-12 ENCOUNTER — Ambulatory Visit
Admission: RE | Admit: 2021-03-12 | Discharge: 2021-03-12 | Disposition: A | Discharge status: Home / Self Care | Payer: Medicare Other | Source: Ambulatory Visit | Attending: Physician Assistant | Admitting: Physician Assistant

## 2021-03-12 ENCOUNTER — Other Ambulatory Visit: Payer: Self-pay

## 2021-03-12 DIAGNOSIS — Z1231 Encounter for screening mammogram for malignant neoplasm of breast: Secondary | ICD-10-CM

## 2021-03-22 ENCOUNTER — Ambulatory Visit (INDEPENDENT_AMBULATORY_CARE_PROVIDER_SITE_OTHER): Payer: Medicare Other | Admitting: Physician Assistant

## 2021-03-22 ENCOUNTER — Other Ambulatory Visit: Payer: Self-pay

## 2021-03-22 ENCOUNTER — Encounter: Payer: Self-pay | Admitting: Physician Assistant

## 2021-03-22 VITALS — BP 178/79 | HR 75 | Temp 97.5°F | Ht 66.0 in | Wt 237.0 lb

## 2021-03-22 DIAGNOSIS — I10 Essential (primary) hypertension: Secondary | ICD-10-CM | POA: Diagnosis not present

## 2021-03-22 DIAGNOSIS — I739 Peripheral vascular disease, unspecified: Secondary | ICD-10-CM

## 2021-03-22 DIAGNOSIS — M545 Low back pain, unspecified: Secondary | ICD-10-CM | POA: Diagnosis not present

## 2021-03-22 MED ORDER — METHYLPREDNISOLONE 4 MG PO TBPK: ORAL_TABLET | ORAL | 0 refills | Status: DC

## 2021-03-22 MED ORDER — METOPROLOL SUCCINATE ER 25 MG PO TB24: 25.0000 mg | ORAL_TABLET | Freq: Every day | ORAL | 0 refills | Status: DC

## 2021-03-22 NOTE — Progress Notes (Signed)
Established Patient Office Visit  Subjective:  Patient ID: Kimberly Ferrell, female    DOB: 1940/11/03  Age: 80 y.o. MRN: [id number]  CC:  Chief Complaint  Patient presents with   Follow-up   Hypertension    HPI Kimberly Ferrell presents for follow up hypertension. Patient is accompanied by her son and husband. Patient reports continues with low back pain. Sates lower legs ache. Denies pain with walking. Has been doing a lot cleaning around the house. Denies joint swelling, stiffness or redness. Denies numbness or tingling sensation down lower extremities. Pt denies chest pain, palpitations, orthopnea, shortness of breath, or lower extremity swelling. Dizziness stable, no changes. Patient reports only taking blood pressure medication once daily, forgets to take second dose. Patient's husband helps check BP at home and readings range 112-170s/60-90s.           Past Medical History:  Diagnosis Date   Arthritis    Cancer ([place])    skin cancer removed from right forehead   Chicken pox as child   Colon polyps    Complication of anesthesia    "hard to wake up"   GERD (gastroesophageal reflux disease)    occasional   Hemorrhoids    "sometimes"   Hypertension    Lung nodule seen on imaging study    noted 8/13- needs CT f/u in 1 year   Mononucleosis    Pneumonia    hx of   PONV (postoperative nausea and vomiting)    Rosacea    TIA (transient ischemic attack) 2007   from IV dye, no residual, passed out and was told by RN that it was TIA    Past Surgical History:  Procedure Laterality Date   ABDOMINAL HYSTERECTOMY  1993   BREAST BIOPSY Left 2004   [place] Core    [place]  1960's   PARTIAL KNEE ARTHROPLASTY Right 08/09/2016   Procedure: RIGHT UNICOMPARTMENTAL KNEE;  Surgeon: Gaynelle Arabian, MD;  Location: [facility];  Service: Orthopedics;  Laterality: Right;  requests 1hr; Adductor Block   TOTAL KNEE ARTHROPLASTY Left 05/23/2013   Procedure:  LEFT TOTAL KNEE ARTHROPLASTY;  Surgeon: Gearlean Alf, MD;  Location: [facility];  Service: Orthopedics;  Laterality: Left;    Family History  Problem Relation Age of Onset   Cancer Mother 20       colon   Sudden death Father 25       suicide   Stroke Maternal Grandmother 78   Hypertension Neg Hx    Hyperlipidemia Neg Hx    Heart attack Neg Hx    Diabetes Neg Hx    Breast cancer Neg Hx     Social History   Socioeconomic History   Marital status: Married    Spouse name: Not on file   Number of children: Not on file   Years of education: Not on file   Highest education level: Not on file  Occupational History   Not on file  Tobacco Use   Smoking status: Never   Smokeless tobacco: Never  Substance and Sexual Activity   Alcohol use: No   Drug use: No   Sexual activity: Not Currently  Other Topics Concern   Not on file  Social History Narrative   Not on file   Social Determinants of Health   Financial Resource Strain: Not on file  Food Insecurity: Not on file  Transportation Needs: Not on file  Physical Activity: Not on file  Stress: Not on file  Social Connections: Not on file  Intimate Partner Violence: Not on file    Outpatient Medications Prior to Visit  Medication Sig Dispense Refill   carvedilol (COREG) 12.5 MG tablet Take 1 tablet (12.5 mg total) by mouth 2 (two) times daily with a meal. 180 tablet 0   No facility-administered medications prior to visit.    Allergies  Allergen Reactions   Other Other (See Comments)    IV dye, heart stopped   Amlodipine Nausea And Vomiting    Dizziness   Valsartan Rash    ROS Review of Systems Review of Systems:  A fourteen system review of systems was performed and found to be positive as per HPI.    Objective:    Physical Exam General:  Well Developed, well nourished, appropriate for stated age.  Neuro:  Alert and oriented,  extra-ocular muscles intact  HEENT:  Normocephalic, atraumatic, neck supple Skin:   no gross rash, warm, pink. Cardiac:  RRR Respiratory:  Expiratory wheezing, no rhonchi, crackles or rales. Not using accessory muscles, speaking in full sentences- unlabored. MSK: no step-off noted, no midline or SI tenderness, tenderness of lumbar paraspinal muscles, 2+ patellar reflexes  Ext./Vascular:  Varicosities noted of both lower extremities, no calve tenderness, no pitting edema Psych:  No HI/SI, judgement and insight good, Euthymic mood. Full Affect.  BP (!) 178/79   Pulse 75   Temp (!) 97.5 F (36.4 C)   Ht $R'5\' 6"'AX$  (1.676 m)   Wt 237 lb (107.5 kg)   SpO2 97%   BMI 38.25 kg/m  Wt Readings from Last 3 Encounters:  03/22/21 237 lb (107.5 kg)  02/22/21 236 lb 1.9 oz (107.1 kg)  01/19/21 227 lb (103 kg)     Health Maintenance Due  Topic Date Due   COVID-19 Vaccine (4 - Booster for Pfizer series) 06/24/2020    There are no preventive care reminders to display for this patient.  Lab Results  Component Value Date   TSH 1.490 01/12/2021   Lab Results  Component Value Date   WBC 6.3 01/12/2021   HGB 14.4 01/12/2021   HCT 42.0 01/12/2021   MCV 90 01/12/2021   PLT 165 01/12/2021   Lab Results  Component Value Date   NA 143 01/12/2021   K 4.1 01/12/2021   CO2 23 01/12/2021   GLUCOSE 107 (H) 01/12/2021   BUN 10 01/12/2021   CREATININE 0.82 01/12/2021   BILITOT 0.7 01/12/2021   ALKPHOS 88 01/12/2021   AST 37 01/12/2021   ALT 30 01/12/2021   PROT 6.9 01/12/2021   ALBUMIN 4.1 01/12/2021   CALCIUM 9.3 01/12/2021   ANIONGAP 9 08/10/2016   EGFR 72 01/12/2021   GFR 81.93 04/16/2013   Lab Results  Component Value Date   CHOL 140 01/12/2021   Lab Results  Component Value Date   HDL 45 01/12/2021   Lab Results  Component Value Date   LDLCALC 76 01/12/2021   Lab Results  Component Value Date   TRIG 102 01/12/2021   Lab Results  Component Value Date   CHOLHDL 3.1 01/12/2021   Lab Results  Component Value Date   HGBA1C 5.8 (H) 01/12/2021       Assessment & Plan:   Problem List Items Addressed This Visit       Other   Back pain   Relevant Medications   methylPREDNISolone (MEDROL DOSEPAK) 4 MG TBPK tablet   Other Visit Diagnoses     Uncontrolled hypertension    -  Primary   Relevant Medications   metoprolol succinate (TOPROL-XL) 25 MG 24 hr tablet   Peripheral vascular disease (HCC)       Relevant Medications   metoprolol succinate (TOPROL-XL) 25 MG 24 hr tablet   Other Relevant Orders   VAS [place] ABI WITH/WO TBI      Uncontrolled hypertension: -BP remains uncontrolled, discussed with patient and family changing antihypertensive to once daily dosing to improve medication adherence and are in agreement.  Metoprolol succinate is preferred per insurance so we will start metoprolol succinate 25 mg daily and discontinue carvedilol 12.5 mg twice daily.  Discussed monitoring blood pressure and heart rate once daily and keep a log to bring to next office visit.  Will reassess blood pressure and medication therapy in 4 weeks.  Recommend to keep neurology consult as scheduled to address cognitive decline.  Back pain: -Discussed with patient likely musculoskeletal etiology-muscle strain from overuse.  Will treat conservatively with corticosteroid therapy and recommend to apply heat therapy.  If symptoms fail to improve or worsen recommend further evaluation with lumbar x-ray and/or follow up with Orthopedics (has seen [facility] in the past).  Peripheral vascular disease: -Patient has c/o lower extremity pain and [facility] home assessment indicated signs of claudication/PVD, will place order for ABI for further evaluation. Discussed with patient starting statin therapy for LDL goal of <70 and will consider stating medication pending ABI results.   Meds ordered this encounter  Medications   metoprolol succinate (TOPROL-XL) 25 MG 24 hr tablet    Sig: Take 1 tablet (25 mg total) by mouth daily.    Dispense:  30 tablet    Refill:  0     Order Specific Question:   Supervising Provider    Answer:   Beatrice Lecher D [[id number]]   methylPREDNISolone (MEDROL DOSEPAK) 4 MG TBPK tablet    Sig: Take as directed on package.    Dispense:  21 tablet    Refill:  0    Order Specific Question:   Supervising Provider    Answer:   Beatrice Lecher D [[id number]]    Follow-up: Return in about 4 weeks (around 04/19/2021) for HTN- changed med..   Note:  This note was prepared with assistance of Dragon voice recognition software. Occasional wrong-word or sound-a-like substitutions may have occurred due to the inherent limitations of voice recognition software.  Lorrene Reid, PA-C

## 2021-03-24 ENCOUNTER — Encounter (HOSPITAL_COMMUNITY): Payer: Medicare Other

## 2021-04-04 ENCOUNTER — Other Ambulatory Visit: Payer: Self-pay

## 2021-04-04 ENCOUNTER — Encounter: Payer: Self-pay | Admitting: Psychiatry

## 2021-04-04 ENCOUNTER — Ambulatory Visit: Payer: Medicare Other | Admitting: Psychiatry

## 2021-04-04 VITALS — BP 180/83 | HR 75 | Ht 66.0 in | Wt 239.2 lb

## 2021-04-04 DIAGNOSIS — R413 Other amnesia: Secondary | ICD-10-CM | POA: Diagnosis not present

## 2021-04-04 MED ORDER — LORAZEPAM 0.5 MG PO TABS: ORAL_TABLET | ORAL | 0 refills | Status: DC
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
## 2021-04-04 NOTE — Patient Instructions (Addendum)
MRI brain Blood work to check vitamin B12 level Let your doctor know you have not been taking your most recent blood pressure medication (blood pressure today was 208/96)  A person with mild cognitive impairment (MCI) experiences memory problems greater than normally expected with aging, but not serious enough to interfere with daily activities.  The patient with MCI complains of difficulty with memory. Typically, the complaints include trouble remembering the names of people they met recently, trouble remembering the flow of a conversation, and an increased tendency to misplace things, or similar problems. In many cases, the individual will be aware of these difficulties and will compensate with increased reliance on notes and calendars.   Although there is an increased chances of going on to develop dementia, it is not possible currently to predict with certainty which patients with MCI will or will not go on to develop dementia. There is currently no specific treatment for MCI. People leading sedentary lifestyles are at greater risk for developing dementia. Increased physical activity and brain exercise can help with maintaining brain function.  Tasks to improve attention/working memory 1. Good sleep hygiene (7-8 hrs of sleep) 2. Learning a new skill (Painting, Carpentry, Pottery, new language, Knitting). 3.Cognitive exercises (keep a daily journal, Puzzles) 4. Physical exercise and training  (30 min/day X 4 days week) 5. Being on Antidepressant if needed 6.Yoga, Meditation, Tai Chi 7. Decrease alcohol intake 8.Have a clear schedule and structure in daily routine

## 2021-04-05 LAB — VITAMIN B12: Vitamin B-12: 350 pg/mL (ref 232–1245)

## 2021-04-19 ENCOUNTER — Ambulatory Visit: Payer: Medicare Other | Admitting: Physician Assistant

## 2021-04-19 ENCOUNTER — Telehealth: Payer: Self-pay | Admitting: Psychiatry

## 2021-04-19 NOTE — Telephone Encounter (Signed)
Noted, thank you

## 2021-04-19 NOTE — Telephone Encounter (Signed)
Patient states she was unaware that the MRI was order and wasn't sure why.. she would like a call back to discuss.

## 2021-04-19 NOTE — Telephone Encounter (Signed)
Contacted pt informed her MRI of brain was ordered to assess for signs of neurodegeneration and/or significant vascular disease due to memory loss. Pt was appreciative for the clarity.

## 2021-04-22 ENCOUNTER — Encounter (HOSPITAL_COMMUNITY): Payer: Self-pay

## 2021-05-03 ENCOUNTER — Ambulatory Visit: Payer: Medicare Other | Admitting: Physician Assistant

## 2021-06-06 ENCOUNTER — Other Ambulatory Visit: Payer: Self-pay | Admitting: Physician Assistant

## 2021-06-06 DIAGNOSIS — I10 Essential (primary) hypertension: Secondary | ICD-10-CM

## 2021-06-09 ENCOUNTER — Telehealth: Payer: Self-pay | Admitting: Physician Assistant

## 2021-06-09 ENCOUNTER — Other Ambulatory Visit: Payer: Self-pay | Admitting: Physician Assistant

## 2021-06-09 DIAGNOSIS — I10 Essential (primary) hypertension: Secondary | ICD-10-CM

## 2021-06-09 NOTE — Telephone Encounter (Signed)
Patient would like Carvedilol to be sent to the pharmacy. She states the Metoprolol make her dizzy and she has started taking the Carvedilol again. AS, CMA

## 2021-06-10 NOTE — Telephone Encounter (Signed)
Patient is aware and scheduled for 06/15/2021

## 2021-06-15 ENCOUNTER — Other Ambulatory Visit: Payer: Self-pay

## 2021-06-15 ENCOUNTER — Ambulatory Visit (INDEPENDENT_AMBULATORY_CARE_PROVIDER_SITE_OTHER): Payer: Medicare Other | Admitting: Physician Assistant

## 2021-06-15 ENCOUNTER — Encounter: Payer: Self-pay | Admitting: Physician Assistant

## 2021-06-15 VITALS — BP 175/85 | HR 72 | Temp 97.6°F | Ht 66.0 in | Wt 239.0 lb

## 2021-06-15 DIAGNOSIS — I10 Essential (primary) hypertension: Secondary | ICD-10-CM

## 2021-06-15 MED ORDER — LISINOPRIL-HYDROCHLOROTHIAZIDE 20-12.5 MG PO TABS
1.0000 | ORAL_TABLET | Freq: Every day | ORAL | 2 refills | Status: DC
Start: 2021-06-15 — End: 2023-03-12

## 2021-06-15 NOTE — Progress Notes (Signed)
Established Patient Office Visit  Subjective:  Patient ID: Kimberly Ferrell, female    DOB: [birth year]  Age: 81 y.o. MRN: [id number]  CC:  Chief Complaint  Patient presents with   Follow-up   Hypertension    HPI TARRI GUILFOIL presents for follow up on hypertension. Patient is accompanied by her husband. Patient reports did not tolerate metoprolol because of dizziness. She stopped medication about 1-2 months. States she feels fine. Denies chest pain, palpitations, shortness of breath, dizziness or lower extremity swelling. BP readings at home have been 164/70-80.  Past Medical History:  Diagnosis Date   Arthritis    Cancer ([place])    skin cancer removed from right forehead   Chicken pox as child   Colon polyps    Complication of anesthesia    "hard to wake up"   GERD (gastroesophageal reflux disease)    occasional   Hemorrhoids    "sometimes"   Hypertension    Lung nodule seen on imaging study    noted 8/13- needs CT f/u in 1 year   Mononucleosis    Pneumonia    hx of   PONV (postoperative nausea and vomiting)    Rosacea    TIA (transient ischemic attack) 2007   from IV dye, no residual, passed out and was told by RN that it was TIA    Past Surgical History:  Procedure Laterality Date   ABDOMINAL HYSTERECTOMY  1993   BREAST BIOPSY Left 2004   [place]    [place]  1960's   PARTIAL KNEE ARTHROPLASTY Right 08/09/2016   Procedure: RIGHT UNICOMPARTMENTAL KNEE;  Surgeon: Gaynelle Arabian, MD;  Location: [facility];  Service: Orthopedics;  Laterality: Right;  requests 1hr; Adductor Block   TOTAL KNEE ARTHROPLASTY Left 05/23/2013   Procedure: LEFT TOTAL KNEE ARTHROPLASTY;  Surgeon: Gearlean Alf, MD;  Location: [facility];  Service: Orthopedics;  Laterality: Left;    Family History  Problem Relation Age of Onset   Cancer Mother 85       colon   Sudden death Father 21       suicide   Stroke Maternal Grandmother 78   Hypertension Neg Hx     Hyperlipidemia Neg Hx    Heart attack Neg Hx    Diabetes Neg Hx    Breast cancer Neg Hx     Social History   Socioeconomic History   Marital status: Married    Spouse name: Josph Macho   Number of children: 2   Years of education: Not on file   Highest education level: High school graduate  Occupational History   Not on file  Tobacco Use   Smoking status: Never   Smokeless tobacco: Never  Substance and Sexual Activity   Alcohol use: No   Drug use: No   Sexual activity: Not Currently  Other Topics Concern   Not on file  Social History Narrative   04/04/21 Lives with husband   Social Determinants of Health   Financial Resource Strain: Not on file  Food Insecurity: Not on file  Transportation Needs: Not on file  Physical Activity: Not on file  Stress: Not on file  Social Connections: Not on file  Intimate Partner Violence: Not on file    Outpatient Medications Prior to Visit  Medication Sig Dispense Refill   metoprolol succinate (TOPROL-XL) 25 MG 24 hr tablet Take 1 tablet (25 mg total) by mouth daily. 30 tablet 0   LORazepam (  ATIVAN) 0.5 MG tablet Take one pill 1 hour before MRI. Can take a second pill after 30 minutes if still anxious 2 tablet 0   No facility-administered medications prior to visit.    Allergies  Allergen Reactions   Other Other (See Comments)    IV dye, heart stopped   Amlodipine Nausea And Vomiting    Dizziness   Valsartan Rash    ROS Review of Systems Review of Systems:  A fourteen system review of systems was performed and found to be positive as per HPI.   Objective:    Physical Exam General:  Well Developed, well nourished, appropriate for stated age.  Neuro:  Alert and oriented,  extra-ocular muscles intact  HEENT:  Normocephalic, atraumatic, neck supple, no adenopathy  Skin:  no gross rash, warm, pink. Cardiac:  RRR, S1 S2 Respiratory: CTA B/L  Vascular:  Ext warm, no cyanosis apprec.; cap RF less 2 sec. Psych:  No HI/SI,  judgement and insight good, Euthymic mood. Full Affect.  BP (!) 175/85    Pulse 72    Temp 97.6 F (36.4 C)    Ht _0  (1.676 m)    Wt 239 lb (108.4 kg)    SpO2 96%    BMI 38.58 kg/m  Wt Readings from Last 3 Encounters:  06/15/21 239 lb (108.4 kg)  04/04/21 239 lb 3.2 oz (108.5 kg)  03/22/21 237 lb (107.5 kg)     Health Maintenance Due  Topic Date Due   COVID-19 Vaccine (4 - Booster for Pfizer series) 06/24/2020    There are no preventive care reminders to display for this patient.  Lab Results  Component Value Date   TSH 1.490 01/12/2021   Lab Results  Component Value Date   WBC 6.3 01/12/2021   HGB 14.4 01/12/2021   HCT 42.0 01/12/2021   MCV 90 01/12/2021   PLT 165 01/12/2021   Lab Results  Component Value Date   NA 143 01/12/2021   K 4.1 01/12/2021   CO2 23 01/12/2021   GLUCOSE 107 (H) 01/12/2021   BUN 10 01/12/2021   CREATININE 0.82 01/12/2021   BILITOT 0.7 01/12/2021   ALKPHOS 88 01/12/2021   AST 37 01/12/2021   ALT 30 01/12/2021   PROT 6.9 01/12/2021   ALBUMIN 4.1 01/12/2021   CALCIUM 9.3 01/12/2021   ANIONGAP 9 08/10/2016   EGFR 72 01/12/2021   GFR 81.93 04/16/2013   Lab Results  Component Value Date   CHOL 140 01/12/2021   Lab Results  Component Value Date   HDL 45 01/12/2021   Lab Results  Component Value Date   LDLCALC 76 01/12/2021   Lab Results  Component Value Date   TRIG 102 01/12/2021   Lab Results  Component Value Date   CHOLHDL 3.1 01/12/2021   Lab Results  Component Value Date   HGBA1C 5.8 (H) 01/12/2021      Assessment & Plan:   Problem List Items Addressed This Visit   None Visit Diagnoses     Uncontrolled hypertension    -  Primary   Relevant Medications   lisinopril-hydrochlorothiazide (ZESTORETIC) 20-12.5 MG tablet   Other Relevant Orders   Comp Met (CMET)      Uncontrolled hypertension: -BP remains uncontrolled. Patient was non-compliant with Carvedilol BID dosing and did not tolerate metoprolol  succinate so will start lisinopril-HCTZ 20-12.5 mg. Discussed potential side effects and advised to let me know if unable to tolerate medication. Will collect CMP as baseline to monitor renal  function and electrolytes. Continue to monitor BP at home. -Will reassess BP and medication therapy in 8 weeks.   Of note, patient reports did not have MRI because of claustrophobia. Advised if memory worsening recommend to follow-up with Neurology. Pt verbalized understanding.   Meds ordered this encounter  Medications   lisinopril-hydrochlorothiazide (ZESTORETIC) 20-12.5 MG tablet    Sig: Take 1 tablet by mouth daily.    Dispense:  30 tablet    Refill:  2    Order Specific Question:   Supervising Provider    Answer:   Beatrice Lecher D [[id number]]    Follow-up: Return in about 3 months (around 09/12/2021), or HTN.    Lorrene Reid, PA-C

## 2021-06-15 NOTE — Patient Instructions (Signed)
Managing Your Hypertension Hypertension, also called high blood pressure, is when the force of the blood pressing against the walls of the arteries is too strong. Arteries are blood vessels that carry blood from your heart throughout your body. Hypertension forces the heart to work harder to pump blood and may cause the arteries to become narrow or stiff. Understanding blood pressure readings Your personal target blood pressure may vary depending on your medical conditions, your age, and other factors. A blood pressure reading includes a higher number over a lower number. Ideally, your blood pressure should be below 120/80. You should know that: The first, or top, number is called the systolic pressure. It is a measure of the pressure in your arteries as your heart beats. The second, or bottom number, is called the diastolic pressure. It is a measure of the pressure in your arteries as the heart relaxes. Blood pressure is classified into four stages. Based on your blood pressure reading, your health care provider may use the following stages to determine what type of treatment you need, if any. Systolic pressure and diastolic pressure are measured in a unit called mmHg. Normal Systolic pressure: below 120. Diastolic pressure: below 80. Elevated Systolic pressure: 120-129. Diastolic pressure: below 80. Hypertension stage 1 Systolic pressure: 130-139. Diastolic pressure: 80-89. Hypertension stage 2 Systolic pressure: 140 or above. Diastolic pressure: 90 or above. How can this condition affect me? Managing your hypertension is an important responsibility. Over time, hypertension can damage the arteries and decrease blood flow to important parts of the body, including the brain, heart, and kidneys. Having untreated or uncontrolled hypertension can lead to: A heart attack. A stroke. A weakened blood vessel (aneurysm). Heart failure. Kidney damage. Eye damage. Metabolic syndrome. Memory and  concentration problems. Vascular dementia. What actions can I take to manage this condition? Hypertension can be managed by making lifestyle changes and possibly by taking medicines. Your health care provider will help you make a plan to bring your blood pressure within a normal range. Nutrition  Eat a diet that is high in fiber and potassium, and low in salt (sodium), added sugar, and fat. An example eating plan is called the Dietary Approaches to Stop Hypertension (DASH) diet. To eat this way: Eat plenty of fresh fruits and vegetables. Try to fill one-half of your plate at each meal with fruits and vegetables. Eat whole grains, such as whole-wheat pasta, brown rice, or whole-grain bread. Fill about one-fourth of your plate with whole grains. Eat low-fat dairy products. Avoid fatty cuts of meat, processed or cured meats, and poultry with skin. Fill about one-fourth of your plate with lean proteins such as fish, chicken without skin, beans, eggs, and tofu. Avoid pre-made and processed foods. These tend to be higher in sodium, added sugar, and fat. Reduce your daily sodium intake. Most people with hypertension should eat less than 1,500 mg of sodium a day. Lifestyle  Work with your health care provider to maintain a healthy body weight or to lose weight. Ask what an ideal weight is for you. Get at least 30 minutes of exercise that causes your heart to beat faster (aerobic exercise) most days of the week. Activities may include walking, swimming, or biking. Include exercise to strengthen your muscles (resistance exercise), such as weight lifting, as part of your weekly exercise routine. Try to do these types of exercises for 30 minutes at least 3 days a week. Do not use any products that contain nicotine or tobacco, such as cigarettes, e-cigarettes,   and chewing tobacco. If you need help quitting, ask your health care provider. Control any long-term (chronic) conditions you have, such as high  cholesterol or diabetes. Identify your sources of stress and find ways to manage stress. This may include meditation, deep breathing, or making time for fun activities. Alcohol use Do not drink alcohol if: Your health care provider tells you not to drink. You are pregnant, may be pregnant, or are planning to become pregnant. If you drink alcohol: Limit how much you use to: 0-1 drink a day for women. 0-2 drinks a day for men. Be aware of how much alcohol is in your drink. In the [place] one drink equals one 12 oz bottle of beer (355 mL), one 5 oz glass of wine (148 mL), or one 1 oz glass of hard liquor (44 mL). Medicines Your health care provider may prescribe medicine if lifestyle changes are not enough to get your blood pressure under control and if: Your systolic blood pressure is 130 or higher. Your diastolic blood pressure is 80 or higher. Take medicines only as told by your health care provider. Follow the directions carefully. Blood pressure medicines must be taken as told by your health care provider. The medicine does not work as well when you skip doses. Skipping doses also puts you at risk for problems. Monitoring Before you monitor your blood pressure: Do not smoke, drink caffeinated beverages, or exercise within 30 minutes before taking a measurement. Use the bathroom and empty your bladder (urinate). Sit quietly for at least 5 minutes before taking measurements. Monitor your blood pressure at home as told by your health care provider. To do this: Sit with your back straight and supported. Place your feet flat on the floor. Do not cross your legs. Support your arm on a flat surface, such as a table. Make sure your upper arm is at heart level. Each time you measure, take two or three readings one minute apart and record the results. You may also need to have your blood pressure checked regularly by your health care provider. General information Talk with your health care  provider about your diet, exercise habits, and other lifestyle factors that may be contributing to hypertension. Review all the medicines you take with your health care provider because there may be side effects or interactions. Keep all visits as told by your health care provider. Your health care provider can help you create and adjust your plan for managing your high blood pressure. Where to find more information National Heart, Lung, and Blood Institute: www.nhlbi.nih.gov American Heart Association: www.heart.org Contact a health care provider if: You think you are having a reaction to medicines you have taken. You have repeated (recurrent) headaches. You feel dizzy. You have swelling in your ankles. You have trouble with your vision. Get help right away if: You develop a severe headache or confusion. You have unusual weakness or numbness, or you feel faint. You have severe pain in your chest or abdomen. You vomit repeatedly. You have trouble breathing. These symptoms may represent a serious problem that is an emergency. Do not wait to see if the symptoms will go away. Get medical help right away. Call your local emergency services (911 in the [place] Do not drive yourself to the hospital. Summary Hypertension is when the force of blood pumping through your arteries is too strong. If this condition is not controlled, it may put you at risk for serious complications. Your personal target blood pressure may vary depending on   your medical conditions, your age, and other factors. For most people, a normal blood pressure is less than 120/80. Hypertension is managed by lifestyle changes, medicines, or both. Lifestyle changes to help manage hypertension include losing weight, eating a healthy, low-sodium diet, exercising more, stopping smoking, and limiting alcohol. This information is not intended to replace advice given to you by your health care provider. Make sure you discuss any questions  you have with your health care provider. Document Revised: 05/19/2019 Document Reviewed: 04/01/2019 Elsevier Patient Education  2022 Elsevier Inc.  

## 2021-06-16 LAB — COMPREHENSIVE METABOLIC PANEL
ALT: 28 IU/L (ref 0–32)
AST: 29 IU/L (ref 0–40)
Albumin/Globulin Ratio: 1.6 (ref 1.2–2.2)
Albumin: 4.3 g/dL (ref 3.7–4.7)
Alkaline Phosphatase: 93 IU/L (ref 44–121)
BUN/Creatinine Ratio: 13 (ref 12–28)
BUN: 10 mg/dL (ref 8–27)
Bilirubin Total: 0.6 mg/dL (ref 0.0–1.2)
CO2: 24 mmol/L (ref 20–29)
Calcium: 9.2 mg/dL (ref 8.7–10.3)
Chloride: 105 mmol/L (ref 96–106)
Creatinine, Ser: 0.79 mg/dL (ref 0.57–1.00)
Globulin, Total: 2.7 g/dL (ref 1.5–4.5)
Glucose: 108 mg/dL — ABNORMAL HIGH (ref 70–99)
Potassium: 4.2 mmol/L (ref 3.5–5.2)
Sodium: 144 mmol/L (ref 134–144)
Total Protein: 7 g/dL (ref 6.0–8.5)
eGFR: 76 mL/min/{1.73_m2} (ref >59)

## 2021-06-18 ENCOUNTER — Other Ambulatory Visit: Payer: Self-pay | Admitting: Physician Assistant

## 2021-06-18 DIAGNOSIS — I10 Essential (primary) hypertension: Secondary | ICD-10-CM

## 2021-08-08 ENCOUNTER — Ambulatory Visit: Payer: Medicare Other | Admitting: Physician Assistant

## 2022-02-06 ENCOUNTER — Other Ambulatory Visit: Payer: Self-pay | Admitting: Physician Assistant

## 2022-02-06 DIAGNOSIS — Z1231 Encounter for screening mammogram for malignant neoplasm of breast: Secondary | ICD-10-CM

## 2022-02-28 ENCOUNTER — Ambulatory Visit (INDEPENDENT_AMBULATORY_CARE_PROVIDER_SITE_OTHER): Payer: Medicare Other | Admitting: Physician Assistant

## 2022-02-28 VITALS — BP 135/80 | HR 65 | Ht 66.0 in | Wt 239.0 lb

## 2022-02-28 DIAGNOSIS — Z23 Encounter for immunization: Secondary | ICD-10-CM

## 2022-02-28 NOTE — Progress Notes (Signed)
Pt is here for flu vaccine pt tolerated injection well

## 2022-03-15 ENCOUNTER — Ambulatory Visit
Admission: RE | Admit: 2022-03-15 | Discharge: 2022-03-15 | Disposition: A | Discharge status: Home / Self Care | Payer: Medicare Other | Source: Ambulatory Visit | Attending: Physician Assistant | Admitting: Physician Assistant

## 2022-03-15 DIAGNOSIS — Z1231 Encounter for screening mammogram for malignant neoplasm of breast: Secondary | ICD-10-CM

## 2022-04-04 ENCOUNTER — Ambulatory Visit: Payer: Medicare Other | Admitting: Psychiatry

## 2023-02-19 ENCOUNTER — Encounter: Payer: Self-pay | Admitting: Family Medicine

## 2023-02-19 ENCOUNTER — Ambulatory Visit (INDEPENDENT_AMBULATORY_CARE_PROVIDER_SITE_OTHER): Payer: Medicare Other | Admitting: Family Medicine

## 2023-02-19 VITALS — BP 172/84 | HR 72 | Ht 66.0 in | Wt 233.4 lb

## 2023-02-19 DIAGNOSIS — R413 Other amnesia: Secondary | ICD-10-CM | POA: Diagnosis not present

## 2023-02-19 DIAGNOSIS — F03B0 Unspecified dementia, moderate, without behavioral disturbance, psychotic disturbance, mood disturbance, and anxiety: Secondary | ICD-10-CM | POA: Insufficient documentation

## 2023-02-19 DIAGNOSIS — Z23 Encounter for immunization: Secondary | ICD-10-CM

## 2023-02-19 DIAGNOSIS — H9193 Unspecified hearing loss, bilateral: Secondary | ICD-10-CM | POA: Diagnosis not present

## 2023-02-19 DIAGNOSIS — I10 Essential (primary) hypertension: Secondary | ICD-10-CM

## 2023-02-19 NOTE — Assessment & Plan Note (Signed)
Patient and son also complaining of patient having worsening hearing.  She feels like it is greater on the right, on exam has difficulty hearing bilaterally.  Discussed how decreased hearing contributes to cognitive decline. - Audiology referral - Discuss hearing aids after audiology

## 2023-02-19 NOTE — Assessment & Plan Note (Addendum)
Patient with worsening short-term memory concerns.  Son is accompanying her.  He notes patient forgetting conversations that have recently happened.  Generally is able to take care of her own ADLs, but has help with driving and finances. - Follow-up in a few weeks for neurocognitive screening test. - Follow-up TSH, CBC, CMP. - Discuss CT head after next visit

## 2023-02-19 NOTE — Assessment & Plan Note (Signed)
>>  ASSESSMENT AND PLAN FOR MEMORY IMPAIRMENT WRITTEN ON 02/19/2023  1:25 PM BY [name], MD  Patient with worsening short-term memory concerns.  Son is accompanying her.  He notes patient forgetting conversations that have recently happened.  Generally is able to take care of her own ADLs, but has help with driving and finances. - Follow-up in a few weeks for neurocognitive screening test. - Follow-up TSH, CBC, CMP. - Discuss CT head after next visit
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
## 2023-02-19 NOTE — Patient Instructions (Signed)
It was nice to see you today,  We addressed the following topics today: -I will put in a referral to the audiologist.  Someone should call you to schedule this appointment. - I would like you to come back in 3 to 4 weeks so we can do a neurocognitive evaluation for your memory concerns - I will get some blood tests in the meantime to look up potential causes of memory impairment.  After your next visit we can discuss getting a CT scan of your head - You should take your blood pressure medication.  You should take it every day.  When you come back in 3 to 4 weeks we will check it again. - I would like you to check your blood pressure once a day and record it and bring it back to me when you come back in for your next visit.  Have a great day,  Frederic Jericho, MD

## 2023-02-19 NOTE — Assessment & Plan Note (Addendum)
Patient rarely takes her blood pressure medication.  Currently only taking her lisinopril-HCTZ.  Not taking any of the beta-blockers.  Blood pressure elevated today.  Discussed the importance of taking his medications with the patient.  Recheck at next visit.

## 2023-02-20 ENCOUNTER — Other Ambulatory Visit: Payer: Self-pay | Admitting: Family Medicine

## 2023-02-20 DIAGNOSIS — Z1231 Encounter for screening mammogram for malignant neoplasm of breast: Secondary | ICD-10-CM

## 2023-02-20 LAB — COMPREHENSIVE METABOLIC PANEL
ALT: 27 [IU]/L (ref 0–32)
AST: 37 [IU]/L (ref 0–40)
Albumin: 4 g/dL (ref 3.7–4.7)
Alkaline Phosphatase: 87 [IU]/L (ref 44–121)
BUN/Creatinine Ratio: 8 — ABNORMAL LOW (ref 12–28)
BUN: 7 mg/dL — ABNORMAL LOW (ref 8–27)
Bilirubin Total: 0.9 mg/dL (ref 0.0–1.2)
CO2: 23 mmol/L (ref 20–29)
Calcium: 9.3 mg/dL (ref 8.7–10.3)
Chloride: 104 mmol/L (ref 96–106)
Creatinine, Ser: 0.84 mg/dL (ref 0.57–1.00)
Globulin, Total: 3 g/dL (ref 1.5–4.5)
Glucose: 120 mg/dL — ABNORMAL HIGH (ref 70–99)
Potassium: 4 mmol/L (ref 3.5–5.2)
Sodium: 141 mmol/L (ref 134–144)
Total Protein: 7 g/dL (ref 6.0–8.5)
eGFR: 69 mL/min/{1.73_m2} (ref >59)

## 2023-02-20 LAB — CBC
Hematocrit: 43.5 % (ref 34.0–46.6)
Hemoglobin: 15.6 g/dL (ref 11.1–15.9)
MCH: 33.8 pg — ABNORMAL HIGH (ref 26.6–33.0)
MCHC: 35.9 g/dL — ABNORMAL HIGH (ref 31.5–35.7)
MCV: 94 fL (ref 79–97)
Platelets: 169 10*3/uL (ref 150–450)
RBC: 4.61 x10E6/uL (ref 3.77–5.28)
RDW: 13.1 % (ref 11.7–15.4)
WBC: 5.2 10*3/uL (ref 3.4–10.8)

## 2023-02-20 LAB — TSH: TSH: 2.29 u[IU]/mL (ref 0.450–4.500)

## 2023-03-12 ENCOUNTER — Encounter: Payer: Self-pay | Admitting: Family Medicine

## 2023-03-12 ENCOUNTER — Ambulatory Visit (INDEPENDENT_AMBULATORY_CARE_PROVIDER_SITE_OTHER): Payer: Medicare Other | Admitting: Family Medicine

## 2023-03-12 ENCOUNTER — Ambulatory Visit: Payer: Medicare Other | Admitting: Family Medicine

## 2023-03-12 VITALS — BP 171/80 | HR 72 | Ht 66.0 in | Wt 232.0 lb

## 2023-03-12 DIAGNOSIS — I10 Essential (primary) hypertension: Secondary | ICD-10-CM | POA: Diagnosis not present

## 2023-03-12 DIAGNOSIS — F03B0 Unspecified dementia, moderate, without behavioral disturbance, psychotic disturbance, mood disturbance, and anxiety: Secondary | ICD-10-CM | POA: Insufficient documentation

## 2023-03-12 MED ORDER — LISINOPRIL-HYDROCHLOROTHIAZIDE 20-12.5 MG PO TABS
2.0000 | ORAL_TABLET | Freq: Every day | ORAL | 2 refills | Status: DC
Start: 2023-03-12 — End: 2023-04-16

## 2023-03-12 NOTE — Progress Notes (Signed)
   Established Patient Office Visit  Subjective   Patient ID: Kimberly Ferrell, female    DOB: 1940-11-10  Age: 82 y.o. MRN: [id number]  Chief Complaint  Patient presents with   Medical Management of Chronic Issues    HPI  Memory loss-patient here today with companied by her husband.  We talked about causes of dementia, potential medications for dementia.  We did a slums test.  The patient scored a 10 out of 30.  Will put results in the media tab.  Of note when patient was asked to list as many animals that she could she mention to cats 3 times, cows 3 times and dogs 3 times.  Did not realize she had repeated herself.  Hypertension-patient brought a list of her blood pressure readings from home.  These were all greater than 150 systolic.  We discussed changes to her blood pressure medication.  She takes Zestoretic 20-12.5.  Increased dose to 40-25 in advised patient to continue checking blood pressures.  The ASCVD Risk score (Arnett DK, et al., 2019) failed to calculate for the following reasons:   The 2019 ASCVD risk score is only valid for ages 51 to 40  Health Maintenance Due  Topic Date Due   Medicare Annual Wellness (AWV)  02/05/2022   COVID-19 Vaccine (4 - 2023-24 season) 01/14/2023      Objective:     BP (!) 171/80   Pulse 72   Ht 5\' 6"  (1.676 m)   Wt 232 lb (105.2 kg)   SpO2 96%   BMI 37.45 kg/m    Physical Exam General: Alert, oriented Pulmonary: No respiratory distress Psych: Forgetful.  Repeats questions.  Alert and oriented to time, location, situation, name.   No results found for any visits on 03/12/23.      Assessment & Plan:   Moderate dementia without behavioral disturbance, psychotic disturbance, mood disturbance, or anxiety, unspecified dementia type [facility]) Assessment & Plan: Patient scored a 10/30 on the slums exam.  Husband accompanied her to her visit.  Patient is able to do most of her daily chores.  She does not drive or do finances.  Blood  testing workup has been negative.  Will get CT of the head. - Follow-up in 1 month and discuss starting medication including Namenda/donepezil.  Orders: -     CT HEAD WO CONTRAST ( ); Future  Uncontrolled hypertension Assessment & Plan: Patient's blood pressure readings from home are all greater than 150 systolic.  Today she was in the 180 systolic range.  She takes 20-12.5 Zestoretic at home.  The pill bottle she brought in appeared to be expiring. - Increased dose to 2 tablets of 20-12.5 daily - Continue checking at home - Follow-up 1 month  Orders: -     Lisinopril-hydroCHLOROthiazide; Take 2 tablets by mouth daily.  Dispense: 60 tablet; Refill: 2     Return in about 4 weeks (around 04/09/2023) for dementia.    [name], MD

## 2023-03-12 NOTE — Assessment & Plan Note (Signed)
Patient's blood pressure readings from home are all greater than 150 systolic.  Today she was in the 180 systolic range.  She takes 20-12.5 Zestoretic at home.  The pill bottle she brought in appeared to be expiring. - Increased dose to 2 tablets of 20-12.5 daily - Continue checking at home - Follow-up 1 month

## 2023-03-12 NOTE — Assessment & Plan Note (Signed)
Patient scored a 10/30 on the slums exam.  Husband accompanied her to her visit.  Patient is able to do most of her daily chores.  She does not drive or do finances.  Blood testing workup has been negative.  Will get CT of the head. - Follow-up in 1 month and discuss starting medication including Namenda/donepezil.

## 2023-03-12 NOTE — Patient Instructions (Signed)
It was nice to see you today,  We addressed the following topics today: -I have increased your blood pressure medication so that you are now take 2 tablets once a day. 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  I am ordering a CT scan to look for potential causes.  We will discuss results when I see you next month. - I have given you information on 2 different medications that are sometimes used in dementia to help with memory.  You can look these over and discuss them with me at your next visit.  Have a great day,  Frederic Jericho, MD

## 2023-03-22 ENCOUNTER — Ambulatory Visit: Payer: Medicare Other

## 2023-03-22 ENCOUNTER — Other Ambulatory Visit: Payer: Self-pay | Admitting: Family Medicine

## 2023-03-22 DIAGNOSIS — Z1231 Encounter for screening mammogram for malignant neoplasm of breast: Secondary | ICD-10-CM

## 2023-03-28 ENCOUNTER — Other Ambulatory Visit: Payer: Medicare Other

## 2023-04-10 ENCOUNTER — Ambulatory Visit: Payer: Medicare Other

## 2023-04-10 DIAGNOSIS — Z0000 Encounter for general adult medical examination without abnormal findings: Secondary | ICD-10-CM

## 2023-04-10 NOTE — Patient Instructions (Signed)
Kimberly Ferrell , Thank you for taking time to come for your Medicare Wellness Visit. I appreciate your ongoing commitment to your health goals. Please review the following plan we discussed and let me know if I can assist you in the future.   Referrals/Orders/Follow-Ups/Clinician Recommendations: none  This is a list of the screening recommended for you and due dates:  Health Maintenance  Topic Date Due   COVID-19 Vaccine (4 - 2023-24 season) 01/14/2023   Medicare Annual Wellness Visit  04/09/2024   DTaP/Tdap/Td vaccine (2 - Td or Tdap) 05/25/2025   Pneumonia Vaccine  Completed   Flu Shot  Completed   DEXA scan (bone density measurement)  Completed   Zoster (Shingles) Vaccine  Completed   HPV Vaccine  Aged Out    Advanced directives: (Copy Requested) Please bring a copy of your health care power of attorney and living will to the office to be added to your chart at your convenience.  Next Medicare Annual Wellness Visit scheduled for next year: No, schedule not open for next year  Insert Preventive Care attachment Insert FALL PREVENTION attachment if needed

## 2023-04-10 NOTE — Progress Notes (Signed)
Subjective:   Kimberly Ferrell is a 82 y.o. female who presents for Medicare Annual (Subsequent) preventive examination.  Visit Complete: Virtual I connected with  Kimberly Ferrell on 04/10/23 by a audio enabled telemedicine application and verified that I am speaking with the correct person using two identifiers.  Patient Location: Home  Provider Location: Office/Clinic  I discussed the limitations of evaluation and management by telemedicine. The patient expressed understanding and agreed to proceed.  Vital Signs: Because this visit was a virtual/telehealth visit, some criteria may be missing or patient reported. Any vitals not documented were not able to be obtained and vitals that have been documented are patient reported.    Cardiac Risk Factors include: advanced age (>52men, >26 women);hypertension     Objective:    Today's Vitals   There is no height or weight on file to calculate BMI.     04/10/2023    8:47 AM 02/05/2021   10:01 AM 11/02/2016    2:44 PM 08/09/2016   11:34 AM 08/01/2016   11:45 AM 01/31/2016    1:04 PM 05/23/2013    2:30 PM  Advanced Directives  Does Patient Have a Medical Advance Directive? Yes Yes Yes Yes Yes Yes Patient has advance directive, copy in chart  Type of Advance Directive Healthcare Power of [place];Living will Living will;Healthcare Power of [place];Out of facility DNR (pink MOST or yellow form) Healthcare Power of [place];Living will Healthcare Power of [place];Living will Healthcare Power of [place];Living will Living will;Healthcare Power of [employer] Power of [employer];Living will  Does patient want to make changes to medical advance directive?   No - Patient declined No - Patient declined   No  Copy of Healthcare Power of Attorney in Chart? No - copy requested No - copy requested No - copy requested Yes   Copy requested from family  Pre-existing out of facility DNR order (yellow form or pink MOST form)       No     Current Medications (verified) Outpatient Encounter Medications as of 04/10/2023  Medication Sig   lisinopril-hydrochlorothiazide (ZESTORETIC) 20-12.5 MG tablet Take 2 tablets by mouth daily.   metoprolol succinate (TOPROL-XL) 25 MG 24 hr tablet Take 1 tablet (25 mg total) by mouth daily.   No facility-administered encounter medications on file as of 04/10/2023.    Allergies (verified) Other, Amlodipine, and Valsartan   History: Past Medical History:  Diagnosis Date   Arthritis    Cancer (HCC)    skin cancer removed from right forehead   Chicken pox as child   Colon polyps    Complication of anesthesia    "hard to wake up"   GERD (gastroesophageal reflux disease)    occasional   Hemorrhoids    "sometimes"   Hypertension    Lung nodule seen on imaging study    noted 8/13- needs CT f/u in 1 year   Mononucleosis    Pneumonia    hx of   PONV (postoperative nausea and vomiting)    Rosacea    TIA (transient ischemic attack) 2007   from IV dye, no residual, passed out and was told by RN that it was TIA   Past Surgical History:  Procedure Laterality Date   ABDOMINAL HYSTERECTOMY  1993   BREAST BIOPSY Left 2004   [place]    CHOLECYSTECTOMY  1992   HEMORRHOID SURGERY  1960's   PARTIAL KNEE ARTHROPLASTY Right 08/09/2016   Procedure: RIGHT UNICOMPARTMENTAL KNEE;  Surgeon: Ollen Gross, MD;  Location: [facility];  Service: Orthopedics;  Laterality: Right;  requests 1hr; Adductor Block   TOTAL KNEE ARTHROPLASTY Left 05/23/2013   Procedure: LEFT TOTAL KNEE ARTHROPLASTY;  Surgeon: Loanne Drilling, MD;  Location: [facility];  Service: Orthopedics;  Laterality: Left;   Family History  Problem Relation Age of Onset   Cancer Mother 32       colon   Sudden death Father 20       suicide   Stroke Maternal Grandmother 6   Hypertension Neg Hx    Hyperlipidemia Neg Hx    Heart attack Neg Hx    Diabetes Neg Hx    Breast cancer Neg Hx    Social History   Socioeconomic History    Marital status: Married    Spouse name: Kimberly Ferrell   Number of children: 2   Years of education: Not on file   Highest education level: High school graduate  Occupational History   Not on file  Tobacco Use   Smoking status: Never   Smokeless tobacco: Never  Vaping Use   Vaping status: Never Used  Substance and Sexual Activity   Alcohol use: No   Drug use: No   Sexual activity: Not Currently  Other Topics Concern   Not on file  Social History Narrative   04/04/21 Lives with husband   Social Determinants of Health   Financial Resource Strain: Low Risk  (04/10/2023)   Overall Financial Resource Strain (CARDIA)    Difficulty of Paying Living Expenses: Not hard at all  Food Insecurity: No Food Insecurity (04/10/2023)   Hunger Vital Sign    Worried About Running Out of Food in the Last Year: Never true    Ran Out of Food in the Last Year: Never true  Transportation Needs: No Transportation Needs (04/10/2023)   PRAPARE - Administrator, Civil Service (Medical): No    Lack of Transportation (Non-Medical): No  Physical Activity: Insufficiently Active (04/10/2023)   Exercise Vital Sign    Days of Exercise per Week: 2 days    Minutes of Exercise per Session: 60 min  Stress: No Stress Concern Present (04/10/2023)   [employer] of Occupational Health - Occupational Stress Questionnaire    Feeling of Stress : Not at all  Social Connections: Moderately Integrated (04/10/2023)   Social Connection and Isolation Panel [NHANES]    Frequency of Communication with Friends and Family: More than three times a week    Frequency of Social Gatherings with Friends and Family: Once a week    Attends Religious Services: More than 4 times per year    Active Member of [employer] or Organizations: No    Attends Engineer, structural: Never    Marital Status: Married    Tobacco Counseling Counseling given: Not Answered   Clinical Intake:  Pre-visit preparation completed:  Yes  Pain : No/denies pain     Nutritional Risks: None Diabetes: No  How often do you need to have someone help you when you read instructions, pamphlets, or other written materials from your doctor or pharmacy?: 1 - Never  Interpreter Needed?: No  Information entered by :: NAllen LPN   Activities of Daily Living    04/10/2023    8:43 AM  In your present state of health, do you have any difficulty performing the following activities:  Hearing? 0  Vision? 0  Difficulty concentrating or making decisions? 1  Walking or climbing stairs? 0  Dressing or bathing? 0  Doing  errands, shopping? 0  Preparing Food and eating ? N  Using the Toilet? N  In the past six months, have you accidently leaked urine? N  Do you have problems with loss of bowel control? N  Managing your Medications? N  Managing your Finances? N  Housekeeping or managing your Housekeeping? N    Patient Care Team: [name], MD as PCP - General (Family Medicine) Ollen Gross, MD as Consulting Physician (Orthopedic Surgery) Cherlyn Roberts, MD as Consulting Physician (Dermatology) Audie Box, Nadyne Coombes, MD (Inactive) as Consulting Physician (Gynecology) Drema Dallas, DO as Consulting Physician (Neurology)  Indicate any recent Medical Services you may have received from other than [facility] providers in the past year (date may be approximate).     Assessment:   This is a routine wellness examination for [place].  Hearing/Vision screen Hearing Screening - Comments:: Denies hearing issues Vision Screening - Comments:: No regular eye exams   Goals Addressed             This Visit's Progress    Patient Stated       04/10/2023, needs to drink more water       Depression Screen    04/10/2023    8:49 AM 02/19/2023    9:44 AM 06/15/2021    2:59 PM 03/22/2021    1:23 PM 02/22/2021    1:35 PM 02/05/2021   10:02 AM 01/19/2021   10:45 AM  PHQ 2/9 Scores  PHQ - 2 Score 0 0 0 0 0 0 0  PHQ- 9 Score  0 1 1 2 2  0 3    Fall Risk    04/10/2023    8:47 AM 02/19/2023    9:45 AM 06/15/2021    2:58 PM 03/22/2021    1:23 PM 02/22/2021    1:35 PM  Fall Risk   Falls in the past year? 0 1 0 0 0  Number falls in past yr: 0 1 0 0 0  Injury with Fall? 0 1 0 0 0  Risk for fall due to : Medication side effect  No Fall Risks No Fall Risks   Follow up Falls prevention discussed;Falls evaluation completed Falls evaluation completed;Falls prevention discussed Falls evaluation completed Falls evaluation completed Falls evaluation completed    MEDICARE RISK AT HOME: Medicare Risk at Home Any stairs in or around the home?: Yes If so, are there any without handrails?: No Home free of loose throw rugs in walkways, pet beds, electrical cords, etc?: Yes Adequate lighting in your home to reduce risk of falls?: Yes Life alert?: No Use of a cane, walker or w/c?: No Grab bars in the bathroom?: Yes Shower chair or bench in shower?: Yes Elevated toilet seat or a handicapped toilet?: No  TIMED UP AND GO:  Was the test performed?  No    Cognitive Function:    04/04/2021    1:51 PM  MMSE - Mini Mental State Exam  Orientation to time 4  Orientation to Place 5  Registration 3  Attention/ Calculation 0  Recall 3  Language- name 2 objects 2  Language- repeat 0  Language- follow 3 step command 3  Language- read & follow direction 1  Write a sentence 1  Copy design 1  Total score 23        04/10/2023    8:50 AM 02/05/2021   10:06 AM 01/19/2021   10:46 AM  6CIT Screen  What Year? 0 points 0 points 4 points  What month? 0  points 0 points 0 points  What time? 3 points 0 points 0 points  Count back from 20 0 points 0 points 4 points  Months in reverse 2 points 0 points 0 points  Repeat phrase 0 points 10 points 0 points  Total Score 5 points 10 points 8 points    Immunizations Immunization History  Administered Date(s) Administered   Fluad Quad(high Dose 65+) 01/19/2021, 02/28/2022   Fluad  Trivalent(High Dose 65+) 02/19/2023   Influenza, High Dose Seasonal PF 01/13/2013, 03/22/2016, 03/06/2017, 04/13/2020   Influenza,inj,quad, With Preservative 02/12/2017, 02/04/2019   Influenza-Unspecified 02/12/2014, 01/29/2018, 02/04/2019   PFIZER(Purple Top)SARS-COV-2 Vaccination 07/07/2019, 07/28/2019, 04/29/2020   Pneumococcal Conjugate-13 12/01/2015   Pneumococcal Polysaccharide-23 02/12/2006   Tdap 05/26/2015   Zoster Recombinant(Shingrix) 01/29/2018, 04/09/2018   Zoster, Live 06/15/2010    TDAP status: Up to date  Flu Vaccine status: Up to date  Pneumococcal vaccine status: Up to date  Covid-19 vaccine status: Information provided on how to obtain vaccines.   Qualifies for Shingles Vaccine? Yes   Zostavax completed Yes   Shingrix Completed?: Yes  Screening Tests Health Maintenance  Topic Date Due   COVID-19 Vaccine (4 - 2023-24 season) 01/14/2023   Medicare Annual Wellness (AWV)  04/09/2024   DTaP/Tdap/Td (2 - Td or Tdap) 05/25/2025   Pneumonia Vaccine 7+ Years old  Completed   INFLUENZA VACCINE  Completed   DEXA SCAN  Completed   Zoster Vaccines- Shingrix  Completed   HPV VACCINES  Aged Out    Health Maintenance  Health Maintenance Due  Topic Date Due   COVID-19 Vaccine (4 - 2023-24 season) 01/14/2023    Colorectal cancer screening: No longer required.   Mammogram status: No longer required due to age.  Bone Density status: Completed 02/15/2018.   Lung Cancer Screening: (Low Dose CT Chest recommended if Age 46-80 years, 20 pack-year currently smoking OR have quit w/in 15years.) does not qualify.   Lung Cancer Screening Referral: no  Additional Screening:  Hepatitis C Screening: does not qualify;   Vision Screening: Recommended annual ophthalmology exams for early detection of glaucoma and other disorders of the eye. Is the patient up to date with their annual eye exam?  No  Who is the provider or what is the name of the office in which the patient  attends annual eye exams? none If pt is not established with a provider, would they like to be referred to a provider to establish care? No .   Dental Screening: Recommended annual dental exams for proper oral hygiene  Diabetic Foot Exam: n/a  Community Resource Referral / Chronic Care Management: CRR required this visit?  No   CCM required this visit?  No     Plan:     I have personally reviewed and noted the following in the patient's chart:   Medical and social history Use of alcohol, tobacco or illicit drugs  Current medications and supplements including opioid prescriptions. Patient is not currently taking opioid prescriptions. Functional ability and status Nutritional status Physical activity Advanced directives List of other physicians Hospitalizations, surgeries, and ER visits in previous 12 months Vitals Screenings to include cognitive, depression, and falls Referrals and appointments  In addition, I have reviewed and discussed with patient certain preventive protocols, quality metrics, and best practice recommendations. A written personalized care plan for preventive services as well as general preventive health recommendations were provided to patient.     Barb Merino, LPN   03/47/4259   After Visit Summary: Gerre Couch  Up) Due to this being a telephonic visit, with patients personalized plan was offered to patient and patient has requested to Pick up at office.  Nurse Notes: none

## 2023-04-16 ENCOUNTER — Encounter: Payer: Self-pay | Admitting: Family Medicine

## 2023-04-16 ENCOUNTER — Ambulatory Visit (INDEPENDENT_AMBULATORY_CARE_PROVIDER_SITE_OTHER): Payer: Medicare Other | Admitting: Family Medicine

## 2023-04-16 VITALS — BP 145/79 | HR 75 | Ht 66.0 in | Wt 236.0 lb

## 2023-04-16 DIAGNOSIS — F03B0 Unspecified dementia, moderate, without behavioral disturbance, psychotic disturbance, mood disturbance, and anxiety: Secondary | ICD-10-CM | POA: Diagnosis not present

## 2023-04-16 DIAGNOSIS — I10 Essential (primary) hypertension: Secondary | ICD-10-CM | POA: Diagnosis not present

## 2023-04-16 MED ORDER — SPIRONOLACTONE 25 MG PO TABS: 25.0000 mg | ORAL_TABLET | Freq: Every day | ORAL | 0 refills | Status: DC

## 2023-04-16 MED ORDER — MEMANTINE HCL ER 7 MG PO CP24: 7.0000 mg | ORAL_CAPSULE | Freq: Every day | ORAL | 0 refills | Status: DC

## 2023-04-16 MED ORDER — LISINOPRIL-HYDROCHLOROTHIAZIDE 20-12.5 MG PO TABS
2.0000 | ORAL_TABLET | Freq: Every day | ORAL | 3 refills | Status: DC
Start: 2023-04-16 — End: 2024-01-02

## 2023-04-16 NOTE — Assessment & Plan Note (Signed)
Initially 173 systolic, improved to 145 on repeat.  Patient endorses compliant with the Zestoretic 40-25 dosing.  Will add spironolactone, follow-up in 1 month.

## 2023-04-16 NOTE — Assessment & Plan Note (Signed)
Starting memantine 7 mg.  Follow-up in 1 month.

## 2023-04-16 NOTE — Patient Instructions (Addendum)
It was nice to see you today,  We addressed the following topics today: -I have added 2 medications, 1 is for blood pressure and 1 is for memory. - Spironolactone is for blood pressure and you should take this in addition to the 2 tablets of Zestoretic that you already take daily - Memantine is a medicine for memory loss.  This can help delay the progression of dementia.  You take this once a day to start out with. - I have sent all of these medications and to your mail-in pharmacy  Have a great day,  Frederic Jericho, MD

## 2023-04-16 NOTE — Progress Notes (Unsigned)
   Established Patient Office Visit  Subjective   Patient ID: Kimberly Ferrell, female    DOB: 12-14-1940  Age: 82 y.o. MRN: [id number]  Chief Complaint  Patient presents with   Medical Management of Chronic Issues    HPI  Dementia-we discussed the patient's previous blood work.  She has not had the CT of her head yet.  Encouraged her to schedule this with [facility] imaging.  Discussed medications including memantine and donepezil.  Will start with memantine.  Discussed possible side effects.  Hypertension-patient is taking her Zestoretic as prescribed (2 tablets daily) blood pressure still elevated.  At home had similar readings.  Discussed adding a another medication.  Will start spironolactone 25 mg.   The ASCVD Risk score (Arnett DK, et al., 2019) failed to calculate for the following reasons:   The 2019 ASCVD risk score is only valid for ages 55 to 1  Health Maintenance Due  Topic Date Due   COVID-19 Vaccine (4 - 2023-24 season) 01/14/2023      Objective:     BP (!) 145/79   Pulse 75   Ht 5\' 6"  (1.676 m)   Wt 236 lb (107 kg)   SpO2 95%   BMI 38.09 kg/m  {Vitals History (Optional):23777}  Physical Exam General: Alert, oriented.  Companied by husband Pulmonary: No respiratory distress. Psych: Able to answer questions appropriately.   No results found for any visits on 04/16/23.      Assessment & Plan:   Moderate dementia without behavioral disturbance, psychotic disturbance, mood disturbance, or anxiety, unspecified dementia type (HCC) Assessment & Plan: Starting memantine 7 mg.  Follow-up in 1 month.   Uncontrolled hypertension Assessment & Plan: Initially 173 systolic, improved to 145 on repeat.  Patient endorses compliant with the Zestoretic 40-25 dosing.  Will add spironolactone, follow-up in 1 month.  Orders: -     Lisinopril-hydroCHLOROthiazide; Take 2 tablets by mouth daily.  Dispense: 180 tablet; Refill: 3  Other orders -     Memantine HCl  ER; Take 1 capsule (7 mg total) by mouth daily.  Dispense: 90 capsule; Refill: 0 -     Spironolactone; Take 1 tablet (25 mg total) by mouth daily.  Dispense: 90 tablet; Refill: 0     Return in about 4 weeks (around 05/14/2023) for HTN, memory.    [name], MD

## 2023-04-24 ENCOUNTER — Ambulatory Visit
Admission: RE | Admit: 2023-04-24 | Discharge: 2023-04-24 | Disposition: A | Discharge status: Home / Self Care | Payer: Medicare Other | Source: Ambulatory Visit | Attending: Family Medicine | Admitting: Family Medicine

## 2023-04-24 DIAGNOSIS — Z1231 Encounter for screening mammogram for malignant neoplasm of breast: Secondary | ICD-10-CM

## 2023-04-27 ENCOUNTER — Other Ambulatory Visit: Payer: Self-pay | Admitting: Family Medicine

## 2023-04-27 DIAGNOSIS — R928 Other abnormal and inconclusive findings on diagnostic imaging of breast: Secondary | ICD-10-CM

## 2023-05-14 ENCOUNTER — Ambulatory Visit (INDEPENDENT_AMBULATORY_CARE_PROVIDER_SITE_OTHER): Payer: Medicare Other | Admitting: Family Medicine

## 2023-05-14 ENCOUNTER — Encounter: Payer: Self-pay | Admitting: Family Medicine

## 2023-05-14 VITALS — BP 187/83 | HR 71 | Ht 66.0 in | Wt 238.4 lb

## 2023-05-14 DIAGNOSIS — I10 Essential (primary) hypertension: Secondary | ICD-10-CM | POA: Diagnosis not present

## 2023-05-14 DIAGNOSIS — F03B0 Unspecified dementia, moderate, without behavioral disturbance, psychotic disturbance, mood disturbance, and anxiety: Secondary | ICD-10-CM

## 2023-05-14 MED ORDER — MEMANTINE HCL ER 7 MG PO CP24: 14.0000 mg | ORAL_CAPSULE | Freq: Every day | ORAL | 1 refills | Status: DC

## 2023-05-14 NOTE — Assessment & Plan Note (Signed)
Taking the memantine extended release 7 mg daily.  No side effects.  Has not noticed any changes.  Nor has husband.  Will increase to 14 mg for 2 weeks and then increase again to 20 mg as long as she tolerates it.  At that time she will follow-up with [place] in approximately 4 weeks.

## 2023-05-14 NOTE — Patient Instructions (Addendum)
It was nice to see you today,  We addressed the following topics today: -I would like you to increase your dose of the Namenda.  This is the medication for her memory issues.  I would like you to take 2 tablets of this a day instead of 1 tablet.   -Then in 2 weeks I would like you to increase it again to 3 tablets as long as she continues to tolerate this medicine. - If you have any side effects that develop after you increase it please let us know prior to your visit next month. - Continue to take your spironolactone once a day. - Please try to take your blood pressure at least once a day and record the value for [place] so that we can look at it the next time I see you.  Have a great day,  Frederic Jericho, MD

## 2023-05-14 NOTE — Assessment & Plan Note (Signed)
Has only been taking it for about 5 days due to not receiving it sooner.  Continue spironolactone and her previous Zestoretic.  Still elevated today.  I rechecked it myself manually and it was greater than 170 systolic with a diastolic less than 80.  Has a high pulse pressure.  Advised to check at home and document so that we can discuss them at her next visit.

## 2023-05-14 NOTE — Progress Notes (Signed)
   Established Patient Office Visit  Subjective   Patient ID: Kimberly Ferrell, female    DOB: 07-07-1940  Age: 82 y.o. MRN: [id number]  Chief Complaint  Patient presents with   Medical Management of Chronic Issues    HPI  Dementia-patient has been taking the memantine.  Does not notice any side effects.  Cannot really tell if it has improved anything.  Does state that a friend of hers died age last week and husband states she called her son 5 times in 1 day to inform him about it, Suggesting she was not remembering calling him.  We discussed increasing the dose to 14 mg and then again to 21 mg after 2 weeks.  We discussed getting a CT of her head.  It has been ordered but not scheduled.  Advised her to call [facility] imaging.  Patient taking spironolactone for hypertension.  She only got it about 5 days ago so she has not been taking it very long.  No side effects so far.  No concerns.  Husband helps her with her medications.   The ASCVD Risk score (Arnett DK, et al., 2019) failed to calculate for the following reasons:   The 2019 ASCVD risk score is only valid for ages 45 to 21  Health Maintenance Due  Topic Date Due   COVID-19 Vaccine (4 - 2024-25 season) 01/14/2023      Objective:     BP (!) 187/83   Pulse 71   Ht 5\' 6"  (1.676 m)   Wt 238 lb 6.4 oz (108.1 kg)   SpO2 94%   BMI 38.48 kg/m    Physical Exam General: Alert, oriented CV: Regular rate and rhythm Psych: Pleasant.  Accompanied by husband.  Repeats questions occasionally.   No results found for any visits on 05/14/23.      Assessment & Plan:   Uncontrolled hypertension Assessment & Plan: Has only been taking it for about 5 days due to not receiving it sooner.  Continue spironolactone and her previous Zestoretic.  Still elevated today.  I rechecked it myself manually and it was greater than 170 systolic with a diastolic less than 80.  Has a high pulse pressure.  Advised to check at home and document so  that we can discuss them at her next visit.   Moderate dementia without behavioral disturbance, psychotic disturbance, mood disturbance, or anxiety, unspecified dementia type (HCC) Assessment & Plan: Taking the memantine extended release 7 mg daily.  No side effects.  Has not noticed any changes.  Nor has husband.  Will increase to 14 mg for 2 weeks and then increase again to 20 mg as long as she tolerates it.  At that time she will follow-up with [place] in approximately 4 weeks.   Other orders -     Memantine HCl ER; Take 2 capsules (14 mg total) by mouth daily.  Dispense: 90 capsule; Refill: 1     Return in about 4 weeks (around 06/11/2023) for memory, htn.    [name], MD

## 2023-05-18 ENCOUNTER — Ambulatory Visit: Payer: Medicare Other | Attending: Audiologist | Admitting: Audiologist

## 2023-05-18 ENCOUNTER — Ambulatory Visit: Payer: Medicare Other | Admitting: Audiologist

## 2023-05-18 DIAGNOSIS — H903 Sensorineural hearing loss, bilateral: Secondary | ICD-10-CM | POA: Insufficient documentation

## 2023-05-18 NOTE — Procedures (Signed)
  Outpatient Audiology and [facility] [address] [place], [place]  [address] [phone]  AUDIOLOGICAL  EVALUATION  NAME: [name]     DOB:   Aug 11, 1940      MRN: [id number]                                                                                     DATE: 05/18/2023     REFERENT: Chandra Toribio POUR, MD STATUS: Outpatient DIAGNOSIS: Sensorineural Hearing Loss Bilateral    History: [name] was seen for an audiological evaluation due to difficulty hearing the TV. She has never had problems with her ears. She is aware she can only really hear people when she can see their face. She has never had Kimberly hearing test before. She denies difficulty hearing people on daily basis.  [name] denies pain, pressure, or tinnitus. [name] no history of hazardous noise exposure.  Medical history shows history of dementia.    Evaluation:  Otoscopy showed Kimberly clear view of the tympanic membranes, bilaterally. Partially occluded by dead skil bilaterally.  Tympanometry results were consistent with normal middle ear function, bilaterally   Audiometric testing was completed using Conventional Audiometry techniques with insert earphones and supraural headphones. Test results are consistent with moderate sloping to severe sensorineural  hearing loss bilaterally. Speech Recognition Thresholds were obtained at 65dB HL in the right ear and at 65dB HL in the left ear. Word Recognition Testing was completed at  95dB HL and [name] scored 88% in the right ear and 92% in the left ear.   Results:  The test results were reviewed with [name] and her husband. [name] has Kimberly moderate sloping to severe sensorineural  hearing loss. She needs hearing aids. Importance of aids emphasized. Offered to call for her to make hearing aid appointment today. [name] prefers to wait and call for hearing aid appointment because she has Kimberly lot going on right now medically.  Audiogram printed and  provided to [name].    Recommendations: Hearing aids recommended for both ears. Patient given list of local hearing aid providers.  Annual audiometric testing recommended to monitor hearing loss for progression.    36 minutes spent testing and counseling on results.   If you have any questions please feel free to contact me at [phone].  Lauraine Ka Au.D.  Audiologist   05/18/2023  12:31 PM  Cc: Chandra Toribio POUR, MD

## 2023-05-21 ENCOUNTER — Other Ambulatory Visit: Payer: Medicare Other

## 2023-05-30 ENCOUNTER — Ambulatory Visit
Admission: RE | Admit: 2023-05-30 | Discharge: 2023-05-30 | Disposition: A | Discharge status: Home / Self Care | Payer: Medicare Other | Source: Ambulatory Visit | Attending: Family Medicine | Admitting: Family Medicine

## 2023-05-30 ENCOUNTER — Other Ambulatory Visit: Payer: Self-pay | Admitting: Family Medicine

## 2023-05-30 DIAGNOSIS — N6310 Unspecified lump in the right breast, unspecified quadrant: Secondary | ICD-10-CM

## 2023-05-30 DIAGNOSIS — R928 Other abnormal and inconclusive findings on diagnostic imaging of breast: Secondary | ICD-10-CM

## 2023-05-30 DIAGNOSIS — N6315 Unspecified lump in the right breast, overlapping quadrants: Secondary | ICD-10-CM | POA: Diagnosis not present

## 2023-06-01 ENCOUNTER — Ambulatory Visit
Admission: RE | Admit: 2023-06-01 | Discharge: 2023-06-01 | Disposition: A | Discharge status: Home / Self Care | Payer: Medicare Other | Source: Ambulatory Visit | Attending: Family Medicine

## 2023-06-01 DIAGNOSIS — R928 Other abnormal and inconclusive findings on diagnostic imaging of breast: Secondary | ICD-10-CM

## 2023-06-01 DIAGNOSIS — N6310 Unspecified lump in the right breast, unspecified quadrant: Secondary | ICD-10-CM

## 2023-06-01 DIAGNOSIS — N6001 Solitary cyst of right breast: Secondary | ICD-10-CM | POA: Diagnosis not present

## 2023-06-19 ENCOUNTER — Encounter: Payer: Self-pay | Admitting: Family Medicine

## 2023-06-19 ENCOUNTER — Ambulatory Visit (INDEPENDENT_AMBULATORY_CARE_PROVIDER_SITE_OTHER): Payer: Medicare Other | Admitting: Family Medicine

## 2023-06-19 VITALS — BP 184/81 | HR 72 | Ht 66.0 in | Wt 234.2 lb

## 2023-06-19 DIAGNOSIS — F03B0 Unspecified dementia, moderate, without behavioral disturbance, psychotic disturbance, mood disturbance, and anxiety: Secondary | ICD-10-CM

## 2023-06-19 DIAGNOSIS — I10 Essential (primary) hypertension: Secondary | ICD-10-CM | POA: Diagnosis not present

## 2023-06-19 NOTE — Assessment & Plan Note (Signed)
Set up the patient's CT scan for her before she left.  Also set up the medication so that she will get her Namenda 21 mg and a prepackaged packet so that we are able to be more compliant.

## 2023-06-19 NOTE — Assessment & Plan Note (Signed)
 Patient's blood pressure still elevated and home readings have all been above 150 systolic.  I think compliance has been an issue due to her memory issues.  Therefore we had set her up with prepackaged multidose packs to make it easier for her to remember which medications to take and when and if she has already taken them.  If her blood pressure is still elevated these changes we can then titrate her dosing.

## 2023-06-19 NOTE — Progress Notes (Signed)
   Established Patient Office Visit  Subjective   Patient ID: Kimberly Ferrell, female    DOB: 11-Feb-1941  Age: 83 y.o. MRN: [id number]  Chief Complaint  Patient presents with   Hypertension    HPI  Dementia-patient accompanied by her husband.  She does not believe they increased her dose of Namenda  to 21 mg.  They state they are still taking 2 tablets.  They have not been able to schedule a CT scan.  She states she called and was told to call back in few days.  When she tried to call again she got a busy signal.  Hypertension-patient states she is taking her Zestoretic  and spironolactone .  Home values are provided and most of them are above 150 systolic, some in the 180 and 90 systolic range.  We talked about the patient's audiology testing.  She does not remember doing it.  She has not sought out hearing hearing aids yet.  Spoke with the patient regarding possibility of using multidose packs to help with compliance.  After explaining anterior patient is agreeance.  Spoke with the representative from [employer].  Set up patient's medications to be mailed to her a month at a time and prepackaged labeled packets.  The ASCVD Risk score (Arnett DK, et al., 2019) failed to calculate for the following reasons:   The 2019 ASCVD risk score is only valid for ages 62 to 40  Health Maintenance Due  Topic Date Due   COVID-19 Vaccine (4 - 2024-25 season) 01/14/2023      Objective:     BP (!) 184/81   Pulse 72   Ht 5' 6 (1.676 m)   Wt 234 lb 4 oz (106.3 kg)   SpO2 93%   BMI 37.81 kg/m    Physical Exam General: Alert, oriented person place time and situation CV: Regular rate rhythm Pulmonary: Lungs clear bilaterally   No results found for any visits on 06/19/23.      Assessment & Plan:   Moderate dementia without behavioral disturbance, psychotic disturbance, mood disturbance, or anxiety, unspecified dementia type [facility]) Assessment & Plan: Set up the patient's CT scan  for her before she left.  Also set up the medication so that she will get her Namenda  21 mg and a prepackaged packet so that we are able to be more compliant.   Uncontrolled hypertension Assessment & Plan: Patient's blood pressure still elevated and home readings have all been above 150 systolic.  I think compliance has been an issue due to her memory issues.  Therefore we had set her up with prepackaged multidose packs to make it easier for her to remember which medications to take and when and if she has already taken them.  If her blood pressure is still elevated these changes we can then titrate her dosing.    I spent 35 minutes in the management of this patient including time spent on the phone helping her set up new mail order pharmacy   Return in about 6 weeks (around 07/31/2023) for memory, HTN.    Toribio MARLA Slain, MD

## 2023-06-19 NOTE — Patient Instructions (Addendum)
 It was nice to see you today,  We addressed the following topics today: -We are trying to schedule your CT scan of your head before you leave.  We have scheduled it for February 28 at 12:40 PM. - You should be getting your medicines through a prepackaged monthly supply from a company called [facility].  They are going to call you in the next 24 hours to get some more information from you. - If you need to call the new pharmacy about anything 24 hours a day you can call them at [phone] - When I see you again in about 6 weeks I would like you to bring your new and old medications in. - Please follow-up with me in approximately 6 weeks.  Have a great day,  Rolan Slain, MD

## 2023-06-24 ENCOUNTER — Other Ambulatory Visit: Payer: Self-pay | Admitting: Family Medicine

## 2023-06-24 MED ORDER — MEMANTINE HCL ER 21 MG PO CP24: 21.0000 mg | ORAL_CAPSULE | Freq: Every day | ORAL | Status: DC

## 2023-06-26 ENCOUNTER — Other Ambulatory Visit: Payer: Self-pay | Admitting: Family Medicine

## 2023-06-26 ENCOUNTER — Telehealth: Payer: Self-pay | Admitting: Family Medicine

## 2023-06-26 NOTE — Telephone Encounter (Unsigned)
Copied from CRM [id number]. Topic: Clinical - Prescription Issue >> Jun 26, 2023  3:46 PM Gildardo Pounds wrote: Reason for CRM: Corrie Dandy with [employer] wants to confirm if Dr Constance Goltz prescribed memantine (NAMENDA XR) 21 MG CP24 24 hr capsule for patient. Please provide dosage and instructions, as well. Ref # [id number]. Callback number is [phone]

## 2023-06-27 MED ORDER — MEMANTINE HCL ER 21 MG PO CP24: 21.0000 mg | ORAL_CAPSULE | Freq: Every day | ORAL | 11 refills | Status: DC

## 2023-06-27 NOTE — Telephone Encounter (Signed)
Yes, the Namenda 21 mg will replace the previous 7 mg capsules she was taking twice a day.  I will resend in the prescription to [facility]

## 2023-06-27 NOTE — Telephone Encounter (Signed)
Called [facility] spoke to Afifa she is advised of the change of the Rx

## 2023-07-02 ENCOUNTER — Other Ambulatory Visit: Payer: Self-pay | Admitting: Family Medicine

## 2023-07-10 ENCOUNTER — Telehealth: Payer: Self-pay | Admitting: Family Medicine

## 2023-07-10 NOTE — Telephone Encounter (Signed)
 Called pt she is advised of the pharmacy trying to contact her gave patient number so she can reach out to [facility]

## 2023-07-10 NOTE — Telephone Encounter (Signed)
 Routing to practice for follow up  Copied from CRM [id number]. Topic: Clinical - Prescription Issue >> Jul 10, 2023  3:34 PM Everette C wrote: Reason for CRM: Erie Noe with [employer] has called to request that a member of practice staff please contact the patient and ask the patient to contact [facility] for approval of payment for the patient's medications to be shipped to them   [facility] has had difficulty reaching the patient to further coordinate their prescriptions   [facility] would like for the patient to please contact them at [phone]  Ref# [id number]

## 2023-07-11 NOTE — Telephone Encounter (Addendum)
 Attn: RMA Dayton Scrape   Please call Erie Noe at [employer] [facility], she says the patient called but was confused and does not remember she is enrolled in [facility]  Erie Noe is asking can you do a three way call. Please advise .

## 2023-07-11 NOTE — Telephone Encounter (Signed)
 Called [facility] spoke with Tresa Endo we did a three way call spoke with both husband and wife they understand that she will be receiving all her medication going forward to there home. Tresa Endo advised them to call her pharmacy to stop receiving refills from them. Tresa Endo stated that she will reach out to them once she received her medication

## 2023-07-13 ENCOUNTER — Other Ambulatory Visit: Payer: Medicare Other

## 2023-07-31 NOTE — Progress Notes (Signed)
 Established Patient Office Visit  Subjective   Patient ID: Kimberly Ferrell, female    DOB: [birth year]  Age: 83 y.o. MRN: [id number]  Chief Complaint  Patient presents with   Medical Management of Chronic Issues    HPI Patient accompanied by her husband.  We discussed the upcoming CT scan that is scheduled for next week.  She was unaware of this, but the husband did seem to know this was scheduled.  The patient and her husband state that they started the [facility] prepackaged medications last week.  They state that there were 4 medications in the packages.  We called [facility] to confirm that there are 3 medications, 2 blood pressure medicines and the medication for memory.  Patient states that she had some nausea with the (Namenda) but This Seems to Be Improving  Patient and Her Husband Live Alone, Has 3 Sons Who Check on Him Frequently.  The Patient Manages the Bills and Cooking and Husband Does Most of the Driving.  The Husband States That for the past 32 Years since He Has Known Her She Has Had Episodes about Once a Week Where She Briefly Loses Consciousness.  He States She her eyes will remain open and when she comes to she generally does not have any confusion.  We looked back in her chart and saw where she was sent to neurology in 2017 for evaluation of this and they did not believe it was related to seizures and recommended cardiology referral for syncope.  I do not see the patient has ever seen a cardiologist.   The ASCVD Risk score (Arnett DK, et al., 2019) failed to calculate for the following reasons:   The 2019 ASCVD risk score is only valid for ages 15 to 25  Health Maintenance Due  Topic Date Due   COVID-19 Vaccine (4 - 2024-25 season) 01/14/2023      Objective:     BP (!) 155/81   Pulse 71   Ht 5\' 6"  (1.676 m)   Wt 234 lb 6.4 oz (106.3 kg)   SpO2 95%   BMI 37.83 kg/m    Physical Exam General: Alert, oriented.  Often repeats herself CV: Rate rate  rhythm Pulmonary: Lungs clear bilaterally.   Assessment & Plan:   Moderate dementia without behavioral disturbance, psychotic disturbance, mood disturbance, or anxiety, unspecified dementia type [facility]) Assessment & Plan: Patient taking her medications now that she has a blister pack mailed to her.  Still performs most of her ADLs with the help of her husband.  CT scan is scheduled for next week.   Vitamin D deficiency -     VITAMIN D 25 Hydroxy (Vit-D Deficiency, Fractures)  History of prediabetes -     Hemoglobin A1c  Primary hypertension -     Basic metabolic panel  Benign essential HTN Assessment & Plan: Blood pressure little bit better today.  She is taking the medications now that she is getting prepackaged medications from [facility]   Brief loss of consciousness Assessment & Plan: Seems this issue was worked up in 2017.  According to the husband it has been happening routinely for 30 years.  In 2017 she saw neurology who felt it was syncope and recommended cardiology referral.  I do not see any cardiology appointments.  Do not see any cardiac monitoring in the past.  The description of the patient's husband gave today for the episodes is more concerning for something like an absence seizure (eyes remaining open) and the  fact that it happens so often over a period of decades.  Will get cardiac monitoring in the form of Zio patch but if this is negative we will consider further workup of seizure.  Between this and the dementia, would benefit from neurology referral sometime soon.      Return in about 2 months (around 10/01/2023) for memory, htn.    Kimberly Kitty, MD

## 2023-08-01 ENCOUNTER — Ambulatory Visit: Payer: Medicare Other | Admitting: Family Medicine

## 2023-08-01 ENCOUNTER — Encounter: Payer: Self-pay | Admitting: Family Medicine

## 2023-08-01 VITALS — BP 155/81 | HR 71 | Ht 66.0 in | Wt 234.4 lb

## 2023-08-01 DIAGNOSIS — I10 Essential (primary) hypertension: Secondary | ICD-10-CM | POA: Diagnosis not present

## 2023-08-01 DIAGNOSIS — F03B0 Unspecified dementia, moderate, without behavioral disturbance, psychotic disturbance, mood disturbance, and anxiety: Secondary | ICD-10-CM | POA: Diagnosis not present

## 2023-08-01 DIAGNOSIS — E559 Vitamin D deficiency, unspecified: Secondary | ICD-10-CM

## 2023-08-01 DIAGNOSIS — Z87898 Personal history of other specified conditions: Secondary | ICD-10-CM

## 2023-08-01 DIAGNOSIS — R55 Syncope and collapse: Secondary | ICD-10-CM

## 2023-08-01 NOTE — Assessment & Plan Note (Addendum)
 Patient taking her medications now that she has a blister pack mailed to her.  Still performs most of her ADLs with the help of her husband.  CT scan is scheduled for next week.

## 2023-08-01 NOTE — Patient Instructions (Signed)
 It was nice to see you today,  We addressed the following topics today: -You have a CT scan of your head next week on March 26 at [facility] imaging on [address]  Please make sure you go to this appointment - I am going to send an order for a heart monitor that you can put on your self.  You can also bring it to [place] and we can put it on you here.  You will wear this for 2 weeks and then return it. - I would like to see you back in 1 month. - We will get some blood test today and we will talk about them at your next visit.  Have a great day,  Frederic Jericho, MD

## 2023-08-01 NOTE — Assessment & Plan Note (Signed)
 Seems this issue was worked up in 2017.  According to the husband it has been happening routinely for 30 years.  In 2017 she saw neurology who felt it was syncope and recommended cardiology referral.  I do not see any cardiology appointments.  Do not see any cardiac monitoring in the past.  The description of the patient's husband gave today for the episodes is more concerning for something like an absence seizure (eyes remaining open) and the fact that it happens so often over a period of decades.  Will get cardiac monitoring in the form of Zio patch but if this is negative we will consider further workup of seizure.  Between this and the dementia, would benefit from neurology referral sometime soon.

## 2023-08-01 NOTE — Assessment & Plan Note (Signed)
 Blood pressure little bit better today.  She is taking the medications now that she is getting prepackaged medications from [facility]

## 2023-08-02 LAB — BASIC METABOLIC PANEL
BUN/Creatinine Ratio: 14 (ref 12–28)
BUN: 10 mg/dL (ref 8–27)
CO2: 22 mmol/L (ref 20–29)
Calcium: 9.7 mg/dL (ref 8.7–10.3)
Chloride: 103 mmol/L (ref 96–106)
Creatinine, Ser: 0.74 mg/dL (ref 0.57–1.00)
Glucose: 109 mg/dL — ABNORMAL HIGH (ref 70–99)
Potassium: 4.3 mmol/L (ref 3.5–5.2)
Sodium: 143 mmol/L (ref 134–144)
eGFR: 81 mL/min/{1.73_m2} (ref >59)

## 2023-08-02 LAB — HEMOGLOBIN A1C
Est. average glucose Bld gHb Est-mCnc: 123 mg/dL
Hgb A1c MFr Bld: 5.9 % — ABNORMAL HIGH (ref 4.8–5.6)

## 2023-08-02 LAB — VITAMIN D 25 HYDROXY (VIT D DEFICIENCY, FRACTURES): Vit D, 25-Hydroxy: 23 ng/mL — ABNORMAL LOW (ref 30.0–100.0)

## 2023-08-08 ENCOUNTER — Other Ambulatory Visit

## 2023-08-16 ENCOUNTER — Ambulatory Visit: Payer: Self-pay | Admitting: Family Medicine

## 2023-08-16 NOTE — Telephone Encounter (Signed)
 Pt informed of below.

## 2023-08-16 NOTE — Telephone Encounter (Signed)
 Let the patient know to stop taking the memantine.  Make sure she knows which medication is the 1 that is making her nauseous.  She will have to remember to do this every time she takes her medication until we can have it removed from her blister packs next month.

## 2023-08-16 NOTE — Telephone Encounter (Signed)
 Potential side effect to medication (memantine 21 mg)  Symptoms: Vomiting, nausea, feels hot, a little headache, a little dizziness Pertinent Negatives: Patient denies rash, abdomen pain, diarrhea, vomiting blood or coffee grounds  Disposition:[x]  Follow-up with PCP  Additional Notes: Pt states she has been taking memantine 21 mg for one week. Pt states every time she takes it she is sick to her stomach and vomits. Pt wants instruction on what to do about this medication. This RN tried to schedule pt an appt with Dr. Constance Goltz who prescribed medication but first available appointment is not until 5/21. This RN told pt that Dr. Constance Goltz will be notified and someone will call her back today from the office with instructions. Pt states understanding. This RN educated pt on new-worsening symptoms and when to call back/seek emergent care. Pt verbalized understanding and agrees to plan.  Pt states the best call back number is [phone] and a detailed message can be left on answering machine.   Copied from CRM [id number]. Topic: Clinical - Red Word Triage >> Aug 16, 2023 10:10 AM Almira Coaster wrote: Red Word that prompted transfer to Nurse Triage: Patient is experiencing side effects to taking memantine (NAMENDA XR) 21 MG CP24 24 hr capsule. She's sick to her stomach and vomiting every time she takes it. Reason for Disposition  [1] MILD or MODERATE vomiting AND [2] present > 48 hours (2 days) (Exception: Mild vomiting with associated diarrhea.)  Answer Assessment - Initial Assessment Questions VOMITING SEVERITY: "How many times have you vomited in the past 24 hours?"      Moderate, about 4 times a day ONSET: "When did the vomiting begin?"      2 days ago FLUIDS: "What fluids or food have you vomited up today?" "Have you been able to keep any fluids down?"     Yes, keeping food down ABDOMEN PAIN: "Are your having any abdomen pain?" If Yes : "How bad is it and what does it feel like?" (e.g., crampy, dull,  intermittent, constant)      Denies DIARRHEA: "Is there any diarrhea?" If Yes, ask: "How many times today?"      Denies CAUSE: "What do you think is causing your vomiting?"     New medication  Protocols used: Vomiting-A-AH

## 2023-09-19 ENCOUNTER — Telehealth: Payer: Self-pay

## 2023-09-19 NOTE — Telephone Encounter (Signed)
 Copied from CRM [id number]. Topic: Clinical - Prescription Issue >> Sep 19, 2023 11:12 AM Lotus Round B wrote: Reason for CRM: Kimberly Ferrell A from [employer] called in because pt told their pharmacy that she no longer takes ANY MEDICATIONS which they are very concerned about . Right now the pharmacy is placing a hold on patients medications till they speak with Dr.Olson himself since he is the only one that has the authority to resume her medications . Phone number to call is [phone] with reference number [id number] .

## 2023-09-19 NOTE — Telephone Encounter (Signed)
 Tried calling pt to confirm current meds but no answer. Will try again later

## 2023-09-19 NOTE — Telephone Encounter (Signed)
 Called pt back and advised her per Dr. Arabella Beach she is to be taking spironolactone  (ALDACTONE ) 25 MG tablet  and lisinopril -hydrochlorothiazide  (ZESTORETIC ) 20-12.5 MG. The pharmacy was on the phone and confirmed that the pill packs will only have the 2 medications in it starting in June.

## 2023-10-02 ENCOUNTER — Encounter: Payer: Self-pay | Admitting: Family Medicine

## 2023-10-02 ENCOUNTER — Ambulatory Visit (INDEPENDENT_AMBULATORY_CARE_PROVIDER_SITE_OTHER): Admitting: Family Medicine

## 2023-10-02 VITALS — BP 140/72 | HR 72 | Ht 66.0 in | Wt 231.1 lb

## 2023-10-02 DIAGNOSIS — F03B0 Unspecified dementia, moderate, without behavioral disturbance, psychotic disturbance, mood disturbance, and anxiety: Secondary | ICD-10-CM

## 2023-10-02 DIAGNOSIS — E559 Vitamin D deficiency, unspecified: Secondary | ICD-10-CM

## 2023-10-02 DIAGNOSIS — I10 Essential (primary) hypertension: Secondary | ICD-10-CM | POA: Diagnosis not present

## 2023-10-02 MED ORDER — VITAMIN D3 50 MCG (2000 UT) PO CAPS: 2000.0000 [IU] | ORAL_CAPSULE | Freq: Every day | ORAL | 3 refills | Status: AC

## 2023-10-02 NOTE — Progress Notes (Signed)
   Established Patient Office Visit  Subjective   Patient ID: Kimberly Ferrell, female    DOB: 1940-08-31  Age: 83 y.o. MRN: [id number]  Chief Complaint  Patient presents with   Medical Management of Chronic Issues    HPI  Subjective - Memory issues: Stopped Namenda  due to intolerance (patient became sick all day) - No new concerns or issues reported - Living situation: Lives with spouse, son Kimberly Ferrell lives in [place] off [employer] - Independent with driving, paying bills, cooking, cleaning - No falls reported  Medications: vitamin D  to be added to pill pack due to low levels, two blood pressure medications currently taken together each morning via pill pack  PMH: Memory issues, hypertension PSH: None mentioned FH: None mentioned Social Hx: Lives with spouse, son nearby in [place], independent with ADLs and IADLs, still driving  ROS: Denies falls, denies new concerns   The ASCVD Risk score (Arnett DK, et al., 2019) failed to calculate for the following reasons:   The 2019 ASCVD risk score is only valid for ages 28 to 67  Health Maintenance Due  Topic Date Due   COVID-19 Vaccine (4 - 2024-25 season) 01/14/2023      Objective:     BP (!) 140/72   Pulse 72   Ht 5\' 6"  (1.676 m)   Wt 231 lb 1.3 oz (104.8 kg)   SpO2 95%   BMI 37.30 kg/m    Physical Exam Gen: alert, oriented Cv: rrr Pulm: lctab   No results found for any visits on 10/02/23.      Assessment & Plan:   Benign essential HTN Assessment & Plan:   -  Blood pressure significantly improved on current regimen   - Treat: Continue current zestoretic  and spironolactone  via pill pack   Vitamin D  deficiency Assessment & Plan: Adding vitamin d  to pill pack   Moderate dementia without behavioral disturbance, psychotic disturbance, mood disturbance, or anxiety, unspecified dementia type (HCC) Assessment & Plan: Removing namenda  from med list.  Pt did not tolerate it, caused significant n/v.     Other orders -     Vitamin D3; Take 1 capsule (2,000 Units total) by mouth daily.  Dispense: 90 capsule; Refill: 3     Return in about 6 months (around 04/03/2024) for HTN.    Laneta Pintos, MD

## 2023-10-02 NOTE — Patient Instructions (Signed)
 It was nice to see you today,  We addressed the following topics today: -Your blood pressure looks much better. - I will add vitamin D  to your pill pack. - The only medications that should be in your pill pack from now on would be the vitamin D  and the 2 blood pressure medicines you are on.  You can take them at the same time every day.  Have a great day,  Etha Henle, MD

## 2023-10-03 NOTE — Assessment & Plan Note (Signed)
-    Blood pressure significantly improved on current regimen   - Treat: Continue current zestoretic  and spironolactone  via pill pack

## 2023-10-03 NOTE — Assessment & Plan Note (Signed)
 Removing namenda  from med list.  Pt did not tolerate it, caused significant n/v.

## 2023-10-03 NOTE — Assessment & Plan Note (Signed)
 Adding vitamin d  to pill pack

## 2023-12-17 DIAGNOSIS — D485 Neoplasm of uncertain behavior of skin: Secondary | ICD-10-CM | POA: Diagnosis not present

## 2024-01-02 ENCOUNTER — Other Ambulatory Visit: Payer: Self-pay | Admitting: Family Medicine

## 2024-01-02 DIAGNOSIS — I10 Essential (primary) hypertension: Secondary | ICD-10-CM

## 2024-01-24 ENCOUNTER — Ambulatory Visit (INDEPENDENT_AMBULATORY_CARE_PROVIDER_SITE_OTHER)

## 2024-01-24 DIAGNOSIS — Z23 Encounter for immunization: Secondary | ICD-10-CM

## 2024-03-26 ENCOUNTER — Other Ambulatory Visit: Payer: Self-pay | Admitting: Family Medicine

## 2024-03-26 DIAGNOSIS — Z1231 Encounter for screening mammogram for malignant neoplasm of breast: Secondary | ICD-10-CM

## 2024-04-03 ENCOUNTER — Encounter: Payer: Self-pay | Admitting: Family Medicine

## 2024-04-03 ENCOUNTER — Ambulatory Visit (INDEPENDENT_AMBULATORY_CARE_PROVIDER_SITE_OTHER): Admitting: Family Medicine

## 2024-04-03 VITALS — BP 140/70 | HR 68 | Ht 66.0 in | Wt 238.8 lb

## 2024-04-03 DIAGNOSIS — R6889 Other general symptoms and signs: Secondary | ICD-10-CM

## 2024-04-03 DIAGNOSIS — I10 Essential (primary) hypertension: Secondary | ICD-10-CM

## 2024-04-03 DIAGNOSIS — Z862 Personal history of diseases of the blood and blood-forming organs and certain disorders involving the immune mechanism: Secondary | ICD-10-CM

## 2024-04-03 NOTE — Assessment & Plan Note (Signed)
 At goal today.  140/70 on repeat.  Is now more compliant with pill packs.  Taking zestoretic  20-12.5 and spironolactone  25mg .  No changes.

## 2024-04-03 NOTE — Progress Notes (Signed)
   Established Patient Office Visit  Subjective   Patient ID: Kimberly Ferrell, female    DOB: 13-Sep-1940  Age: 84 y.o. MRN: [id number]  Chief Complaint  Patient presents with   Medical Management of Chronic Issues    HPI  Pt has no concerns today. Accompanied by her husband. Does endorse feeling cold lately.  She still gets her medications via pill pack.  Is compliant.      The ASCVD Risk score (Arnett DK, et al., 2019) failed to calculate for the following reasons:   The 2019 ASCVD risk score is only valid for ages 23 to 39  Health Maintenance Due  Topic Date Due   COVID-19 Vaccine (4 - 2025-26 season) 01/14/2024   Medicare Annual Wellness (AWV)  04/09/2024      Objective:     BP (!) 140/70   Pulse 68   Ht 5' 6 (1.676 m)   Wt 238 lb 12.8 oz (108.3 kg)   SpO2 97%   BMI 38.54 kg/m    Physical Exam Gen: alert, oriented Cv: rrr, no murmur Pulm: lctab.  No wheeze   No results found for any visits on 04/03/24.      Assessment & Plan:   Benign essential HTN Assessment & Plan: At goal today.  140/70 on repeat.  Is now more compliant with pill packs.  Taking zestoretic  20-12.5 and spironolactone  25mg .  No changes.    Sensation of feeling cold -     Iron, TIBC and Ferritin Panel -     CBC with Differential/Platelet -     TSH  Primary hypertension -     Iron, TIBC and Ferritin Panel -     CBC with Differential/Platelet -     TSH  History of anemia -     Iron, TIBC and Ferritin Panel -     CBC with Differential/Platelet -     TSH     Return in about 6 months (around 10/01/2024) for physical.    Kimberly MARLA Slain, MD

## 2024-04-03 NOTE — Patient Instructions (Signed)
 It was nice to see you today,  We addressed the following topics today: - your blood pressure was good.  No changes to medications today.  - I am getting some labs to see why you may be feeling cold.  I will let you know the results when I get them.   Have a great day,  Rolan Slain, MD

## 2024-04-04 ENCOUNTER — Ambulatory Visit: Payer: Self-pay | Admitting: Family Medicine

## 2024-04-04 LAB — IRON,TIBC AND FERRITIN PANEL
Ferritin: 201 ng/mL — ABNORMAL HIGH (ref 15–150)
Iron Saturation: 37 % (ref 15–55)
Iron: 105 ug/dL (ref 27–139)
Total Iron Binding Capacity: 285 ug/dL (ref 250–450)
UIBC: 180 ug/dL (ref 118–369)

## 2024-04-04 LAB — CBC WITH DIFFERENTIAL/PLATELET
Basophils Absolute: 0 x10E3/uL (ref 0.0–0.2)
Basos: 0 %
EOS (ABSOLUTE): 0.2 x10E3/uL (ref 0.0–0.4)
Eos: 3 %
Hematocrit: 41.9 % (ref 34.0–46.6)
Hemoglobin: 14 g/dL (ref 11.1–15.9)
Immature Grans (Abs): 0 x10E3/uL (ref 0.0–0.1)
Immature Granulocytes: 0 %
Lymphocytes Absolute: 2.2 x10E3/uL (ref 0.7–3.1)
Lymphs: 37 %
MCH: 32.2 pg (ref 26.6–33.0)
MCHC: 33.4 g/dL (ref 31.5–35.7)
MCV: 96 fL (ref 79–97)
Monocytes Absolute: 0.6 x10E3/uL (ref 0.1–0.9)
Monocytes: 10 %
Neutrophils Absolute: 2.8 x10E3/uL (ref 1.4–7.0)
Neutrophils: 50 %
Platelets: 153 x10E3/uL (ref 150–450)
RBC: 4.35 x10E6/uL (ref 3.77–5.28)
RDW: 12.8 % (ref 11.7–15.4)
WBC: 5.8 x10E3/uL (ref 3.4–10.8)

## 2024-04-04 LAB — TSH: TSH: 1.42 u[IU]/mL (ref 0.450–4.500)

## 2024-04-07 NOTE — Progress Notes (Signed)
 Patient is advised.

## 2024-04-07 NOTE — Progress Notes (Signed)
 Called pt no answer if pt calls please advised

## 2024-04-26 ENCOUNTER — Other Ambulatory Visit: Payer: Self-pay | Admitting: Family Medicine

## 2024-04-30 ENCOUNTER — Inpatient Hospital Stay: Admission: RE | Admit: 2024-04-30 | Discharge: 2024-04-30 | Attending: Family Medicine

## 2024-04-30 DIAGNOSIS — Z1231 Encounter for screening mammogram for malignant neoplasm of breast: Secondary | ICD-10-CM

## 2024-09-24 ENCOUNTER — Other Ambulatory Visit

## 2024-10-01 ENCOUNTER — Encounter: Admitting: Family Medicine
